# Patient Record
Sex: Female | Born: 1970 | Race: White | Hispanic: No | Marital: Married | Smoking: Never smoker
Health system: Southern US, Community
[De-identification: ages and names within clinical notes are randomized; demographics above are authoritative.]

## PROBLEM LIST (undated history)

## (undated) ENCOUNTER — Encounter

## (undated) ENCOUNTER — Encounter: Attending: Gastroenterology | Primary: Gastroenterology

## (undated) ENCOUNTER — Ambulatory Visit: Payer: PRIVATE HEALTH INSURANCE

## (undated) ENCOUNTER — Ambulatory Visit

## (undated) ENCOUNTER — Telehealth

## (undated) ENCOUNTER — Ambulatory Visit: Payer: MEDICARE

## (undated) ENCOUNTER — Inpatient Hospital Stay

## (undated) ENCOUNTER — Encounter: Attending: Physical Medicine & Rehabilitation | Primary: Physical Medicine & Rehabilitation

## (undated) ENCOUNTER — Ambulatory Visit: Payer: PRIVATE HEALTH INSURANCE | Attending: Registered" | Primary: Registered"

## (undated) ENCOUNTER — Ambulatory Visit
Payer: PRIVATE HEALTH INSURANCE | Attending: Student in an Organized Health Care Education/Training Program | Primary: Student in an Organized Health Care Education/Training Program

## (undated) ENCOUNTER — Ambulatory Visit: Payer: PRIVATE HEALTH INSURANCE | Attending: Ophthalmology | Primary: Ophthalmology

## (undated) ENCOUNTER — Encounter: Attending: "Endocrinology | Primary: "Endocrinology

## (undated) ENCOUNTER — Ambulatory Visit: Attending: Physical Medicine & Rehabilitation | Primary: Physical Medicine & Rehabilitation

## (undated) ENCOUNTER — Encounter: Attending: Pharmacist | Primary: Pharmacist

## (undated) ENCOUNTER — Encounter
Attending: Student in an Organized Health Care Education/Training Program | Primary: Student in an Organized Health Care Education/Training Program

## (undated) ENCOUNTER — Encounter: Attending: Psychiatric/Mental Health | Primary: Psychiatric/Mental Health

## (undated) ENCOUNTER — Telehealth: Attending: Physical Medicine & Rehabilitation | Primary: Physical Medicine & Rehabilitation

## (undated) ENCOUNTER — Ambulatory Visit: Payer: Medicaid (Managed Care)

## (undated) ENCOUNTER — Ambulatory Visit: Payer: PRIVATE HEALTH INSURANCE | Attending: "Endocrinology | Primary: "Endocrinology

## (undated) ENCOUNTER — Encounter: Attending: Registered" | Primary: Registered"

## (undated) ENCOUNTER — Telehealth
Attending: Student in an Organized Health Care Education/Training Program | Primary: Student in an Organized Health Care Education/Training Program

## (undated) ENCOUNTER — Other Ambulatory Visit

## (undated) ENCOUNTER — Ambulatory Visit
Payer: PRIVATE HEALTH INSURANCE | Attending: Physical Medicine & Rehabilitation | Primary: Physical Medicine & Rehabilitation

## (undated) ENCOUNTER — Encounter: Attending: Oncology | Primary: Oncology

## (undated) DIAGNOSIS — I10 Essential (primary) hypertension: Secondary | ICD-10-CM

## (undated) DIAGNOSIS — R51 Headache: Secondary | ICD-10-CM

## (undated) DIAGNOSIS — R519 Headache, unspecified: Secondary | ICD-10-CM

## (undated) DIAGNOSIS — H539 Unspecified visual disturbance: Secondary | ICD-10-CM

## (undated) HISTORY — PX: SINUS IRRIGATION: SHX2411

## (undated) HISTORY — DX: Headache: R51

## (undated) HISTORY — DX: Headache, unspecified: R51.9

## (undated) HISTORY — DX: Unspecified visual disturbance: H53.9

## (undated) HISTORY — DX: Essential (primary) hypertension: I10

## (undated) NOTE — Progress Notes (Signed)
 Formatting of this note is different from the original.   11/16/24 1342  Respiratory Charges  $ Unlisted Pulmonary Service/Proc 1 Procedure   Analyzed VBG for ER-5 Electronically signed by Alm PARAS. Rejeana, RRT at 11/16/2024 10:43 AM PST

## (undated) NOTE — ED Provider Notes (Signed)
 Formatting of this note is different from the original. [facility] EMERGENCY [address] [place] [address] [phone]  History Chief Complaint  Patient presents with   Abdominal Pain     Vomiting     Patient presents to ED with chief complaint of abdominal pain, nausea, vomiting, diarrhea.  Patient had one episode over Saturday of vomiting and diarrhea.  Today she had three episodes of vomiting.  Patient was started on monjero 05 weeks ago.  She has been taking 2.5 mg, yesterday was there was her 5th dose.  Patient was on Ozempic last year.  History of diabetes.  She is on insulin.  Denies any chest pain or shortness breath.  Patient has history of cholecystectomy.  History provided by:  Patient Language interpreter used: No   Abdominal Pain Pain location:  Epigastric Associated symptoms: diarrhea, nausea and vomiting   Associated symptoms: no chest pain, no chills, no fever, no hematuria, no shortness of breath and no sore throat   Vomiting Associated symptoms: abdominal pain and diarrhea   Associated symptoms: no chills, no fever, no myalgias and no sore throat    Past Medical History:  Diagnosis Date   Cancer (CMS/HCC)    Diabetes mellitus (CMS/HCC)    Hypertension    Lipidemia    HISTORY PROVIDED BY:  Patient  Past Surgical History:  Procedure Laterality Date   ABDOMINAL SURGERY     CHOLECYSTECTOMY     COLON SURGERY     ENDOMETRIAL ABLATION     HERNIA REPAIR     MANDIBLE SURGERY     ROTATOR CUFF REPAIR Bilateral    Social History   Tobacco Use   Smoking status: Never   Smokeless tobacco: Never  Vaping Use   Vaping status: never used  Substance Use Topics   Alcohol use: Never   Drug use: Never   Review of Systems  Constitutional:  Negative for chills and fever.  HENT:  Negative for sore throat and trouble swallowing.   Eyes:  Negative for pain and visual disturbance.  Respiratory:  Negative for chest tightness and shortness of breath.    Cardiovascular:  Negative for chest pain and leg swelling.  Gastrointestinal:  Positive for abdominal pain, diarrhea, nausea and vomiting.  Genitourinary:  Negative for flank pain and hematuria.  Musculoskeletal:  Negative for back pain and myalgias.  Neurological:  Negative for dizziness, weakness and light-headedness.  Psychiatric/Behavioral:  Negative for agitation and sleep disturbance. The patient is not nervous/anxious.    Physical Exam BP 122/73 (BP Location: Left arm, Patient Position: Lying)   Pulse 80   Temp 98.6 F (37 C) (Oral)   Resp 16   Ht 5' 6 (1.676 m)   Wt 113 kg (250 lb)   SpO2 97%   BMI 40.35 kg/m   Physical Exam Vitals and nursing note reviewed.  Constitutional:      Appearance: Normal appearance.  HENT:     Head: Normocephalic and atraumatic.  Cardiovascular:     Rate and Rhythm: Normal rate and regular rhythm.  Pulmonary:     Effort: Pulmonary effort is normal.     Breath sounds: Normal breath sounds.  Abdominal:     General: There is no distension.     Tenderness: There is abdominal tenderness in the right upper quadrant, epigastric area and left upper quadrant.  Musculoskeletal:        General: No deformity or signs of injury. Normal range of motion.     Cervical back:  No rigidity or tenderness.  Skin:    General: Skin is warm.     Capillary Refill: Capillary refill takes less than 2 seconds.  Neurological:     General: No focal deficit present.     Mental Status: She is alert.  Psychiatric:        Mood and Affect: Mood normal.        Behavior: Behavior normal.   Results Labs Reviewed  HEPATIC FUNCTION PANEL - Abnormal      Result Value   Albumin 3.5 (*)    Total Bilirubin 0.5     Bilirubin, Direct 0.11     Alkaline Phosphatase 105     AST 30     ALT 32     Total Protein 7.6     Calcium 8.7    BASIC METABOLIC PANEL - Abnormal   Sodium 141     Potassium 3.3 (*)    Chloride 102     CO2 28.0     BUN 12.0     Creatinine 0.66      Glucose 214 (*)    Calcium 8.6     Glomerular Filtration Rate >90.0     Anion Gap 11.0    MAGNESIUM, SERUM - Abnormal   Magnesium 1.32 (*)   CBC W/ AUTO DIFF - Abnormal   WBC 10.60     RBC 5.02     Hemoglobin 12.1     Hematocrit 38.9     MCV 77.5 (*)    MCH 24.0 (*)    MCHC 31.0 (*)    RDW 16.9 (*)    Platelets 402     MPV 6.9 (*)    Neutrophils (Relative) 75.9     Lymphocytes (Relative) 10.4     Monocytes (Relative) 9.4     Eosinophils (Relative) 4.1     Basophils (Relative) 0.2     Neutrophils (Absolute) 8.1 (*)    Lymphocytes (Absolute) 1.1     Monocytes (Absolute) 1.0 (*)    Eosinophils (Absolute) 0.4     Basophils (Absolute) 0.0    URINALYSIS, W/ REFLEX TO MICRO AND CULT, IF INDICATED - Abnormal   Glucose, UA 2+ (*)    Protein, UA Negative     Bilirubin, UA Negative     Urobilinogen, UA <2.0     pH, UA 5.5     Blood, UA Negative     Ketones, UA Negative     Nitrite, UA Negative     Leukocyte esterase, UA 1+ (*)    Clarity, UA Sl cloudy (*)    Specific Gravity, UA >=1.030     Color, UA Yellow    URINALYSIS, MICROSCOPIC ONLY - Abnormal   RBC, UA 0-2 Rare     WBC, UA 0-5 Rare     Bacteria, UA 1-9 Few (*)    Squamous Epithelial Cells, UA >10 Many (*)   VENOUS BLOOD GAS - Abnormal   pH 7.380     pCO2 44.1     pO2 32.8     HCO3 25.5     Be 0.2     sO2 59.7 (*)    Sample Type VBG     Reported to and Read Back By X2 Dr. Norval     Reported by D.Ostrowski     Read back x 2 Y     Mode ROOM AIR     FiO2 21     Sample Site NA     Allen's Test No  Spont. RR 18     Time Reported 13:44, 11/16/2024     Room [facility] A05    POCT GLUCOSE - Abnormal   POC Glucose 207 (*)   TROPONIN I - Normal   HS Troponin I 8     Narrative:    High-sensitivity Troponin I (hsTnI) assay can reliably detect low troponin concentrations relative to conventional troponin assays. It is the preferred marker of myocardial necrosis as recommended by the Fourth Universal Definition of  Myocardial Infarction Guidelines.  The diagnosis of acute myocardial infarction (AMI) is made based on a rise or fall of troponin with at least one measurement exceeding the laboratory's upper limit of normal(indicating myocardial injury), in the context of reasonable suspicion for coronary ischemia (e.g. typical symptoms, changes on ECG, evidence for loss of myocardial function or demonstration of obstructive coronary artery disease).  NOTE: Although abnormal hsTnI values reflect INJURY to myocardial cells, an elevated hsTnI does not indicate the CAUSE of injury (i.e. ischemia versus non-ischemic disease).  In some cases, myocardial injury is chronic and relatively stable such that hsTnI values remain elevated but do not change substantially over hours to days (examples include chronic kidney disease, heart failure or advanced patient age).  When determining whether there has been a significant rise or fall of troponin on serial sampling, absolute change in troponin concentration has greater diagnostic accuracy for AMI than relative change criteria. A change of >7 ng/L over a 2-hour interval or a change of >10 ng/L over a 3-hour interval is suggested as a significant change.  If the initial hsTnI is below or slightly above the 99th percentile upper reference limit, a 50% change from the baseline is considered significant.  If the initial hsTnI is above the 99th percentile upper reference limit, a 20% change from the baseline value is considered significant.  APTT - Normal   PTT 23.4    LIPASE, SERUM - Normal   Lipase 48    PROTIME-INR - Normal   Protime 10.3     INR 0.94     Narrative:    Clinical Situation                       INR Target   Mechanical prosthetic heart valves       2.5-3.5 Prevention of systemic embolism from     2.0-3.0      Acute myocardila infarction      Valvular heart disease      Atrial fibrilation Pulmonary embolism treatment             2.0-3.0 Venous thrombosis  treatment              2.0-3.0  ACETONE - Normal   Acetone, Bld Negative    CORD BLOOD GAS, VENOUS   Cord pH       Sample Type Blood Venous     Mode ROOM AIR     Spont. RR 18     Sample Site NA     FiO2 21     Room [facility] A05    PH, VENOUS   Imaging Imaging Results       CT Abdomen Pelvis with IV Contrast Only (Final result)  Result time 11/16/24 13:37:11    Final result by Marlyse LOISE Blanch, MD (11/16/24 13:37:11)        Impression:   Fluid-filled colon which may be seen in colitis.  Please correlate clinically.  Normal appendix.  No urolithiasis.  Interpreted  and Electronically Signed by Marlyse Blanch, MD at 11/16/2024 10:37 AM on Workstation [id number]       Narrative:   Procedure:Helical CT Abdomen and Pelvis examination performed with axial sections with  administration of non-ionic IV contrast. Coronal and sagittal 3 mm thick reformats also acquired. CT scan performed according to ALARA(as low as reasonably achievable)dose protocol.  Comparison:None.  History: other,  Findings:  Liver: Mildly enlarged size of the liver. No focal lesion.  Fatty liver. Gallbladder: Cholecystectomy.  Spleen: Normal in size. Pancreas: Normal. No surrounding edema. Adrenal glands: No nodules. Kidneys:  No hydronephrosis. Peritoneum: There is no free air or abscess. Bowel: No obstruction. No evidence of inflamed bowel loops. Appendix is normal.  Fluid-filled colon.  No bowel wall thickening. Mesentery: No adenopathy. Retroperitoneum: Aorta and inferior vena cava unremarkable. Pelvis: Bladder is unremarkable. Bones: No acute fracture. Lung bases: Visualized lung bases are negative. Heart is normal in size.             ED Course   Procedures  MDM  Patient presents with chief complaint of abdominal pain, nausea, vomiting, diarrhea.  Patient had episodes of vomiting and diarrhea onset today.  Symptoms started again today.  Patient is on her 5th dose of Mounjaro.   Last dose was yesterday.  Denies any chest pain shortness breath.  History of diabetes.  On insulin.  History of cholecystectomy.  Physical exam shows patient has epigastric periumbilical tenderness.  No peritoneal signs.  Basic labs ordered, abdominal labs, PH, beta hydroxybutyrate Imaging studies ordered CT abdomen pelvis Patient is was given IV fluids, Pepcid, Toradol , Zofran   Labs reviewed, interpreted by me it shows WBC 10.6.  Hemoglobin 12.1.  Magnesium 1.3.  Magnesium was replaced due to hypomagnesemia.  PT PTT normal.  Troponin is eight.  CT scan reviewed, interpreted by me shows no acute process.  Radiology report noted presence of colitis.  Patient was re-evaluated feeling much better.  Suspect the colitis secondary to oral intake along with side effects of medications.  No antibiotics indicated.  Patient is stable for discharge with outpatient follow-up  Patient is stable for discharge from emergency room standpoint.  Strict return instructions given explained.  Follow up with the PCP in outpatient setting.  Return instructions given explained.  Diagnosis Acute colitis  Diarrhea  ED Disposition Discharge  Final diagnoses:  Acute colitis    Follow-up Information     [facility].   Specialty: Emergency Medicine Contact information [address] [place]  [address] [phone]           Norval DELENA Maroon, DO 11/16/24 2321  Electronically signed by Norval DELENA Maroon, DO at 11/16/2024  8:21 PM PST

## (undated) NOTE — ED Notes (Signed)
 Formatting of this note might be different from the original. To CT via stretcher. Electronically signed by Levon Bel, RN at 11/16/2024 10:00 AM PST

---
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
## 2007-06-29 ENCOUNTER — Encounter: Payer: Self-pay | Admitting: Neurology

## 2009-06-02 ENCOUNTER — Encounter: Admission: RE | Admit: 2009-06-02 | Discharge: 2009-06-02 | Payer: Self-pay | Admitting: Otolaryngology

## 2011-06-22 ENCOUNTER — Encounter: Payer: Self-pay | Admitting: Neurology

## 2011-07-09 ENCOUNTER — Encounter: Payer: Self-pay | Admitting: Neurology

## 2011-09-02 DIAGNOSIS — H9201 Otalgia, right ear: Secondary | ICD-10-CM | POA: Insufficient documentation

## 2011-09-02 DIAGNOSIS — H9209 Otalgia, unspecified ear: Secondary | ICD-10-CM | POA: Insufficient documentation

## 2012-02-03 ENCOUNTER — Encounter: Payer: Self-pay | Admitting: Neurology

## 2014-12-07 ENCOUNTER — Ambulatory Visit (INDEPENDENT_AMBULATORY_CARE_PROVIDER_SITE_OTHER): Payer: BC Managed Care – PPO | Admitting: Neurology

## 2014-12-07 ENCOUNTER — Encounter: Payer: Self-pay | Admitting: Neurology

## 2014-12-07 VITALS — BP 134/82 | HR 70 | Resp 14 | Ht 66.0 in | Wt 271.2 lb

## 2014-12-07 DIAGNOSIS — G4733 Obstructive sleep apnea (adult) (pediatric): Secondary | ICD-10-CM | POA: Insufficient documentation

## 2014-12-07 DIAGNOSIS — G43709 Chronic migraine without aura, not intractable, without status migrainosus: Secondary | ICD-10-CM | POA: Insufficient documentation

## 2014-12-07 DIAGNOSIS — R930 Abnormal findings on diagnostic imaging of skull and head, not elsewhere classified: Secondary | ICD-10-CM

## 2014-12-07 DIAGNOSIS — R9089 Other abnormal findings on diagnostic imaging of central nervous system: Secondary | ICD-10-CM | POA: Insufficient documentation

## 2014-12-07 DIAGNOSIS — IMO0002 Reserved for concepts with insufficient information to code with codable children: Secondary | ICD-10-CM | POA: Insufficient documentation

## 2014-12-07 MED ORDER — OXYCODONE-ACETAMINOPHEN 10-325 MG PO TABS: 1.0000 | ORAL_TABLET | Freq: Three times a day (TID) | ORAL | Status: DC | PRN

## 2014-12-07 NOTE — Progress Notes (Signed)
[facility]  PATIENT: Pamela Murphy DOB: [birth year]  REFERRING CLINICIAN: [facility] HISTORY FROM: Patient REASON FOR VISIT: Chronic Migraine headaches   HISTORICAL  CHIEF COMPLAINT:  Chief Complaint  Patient presents with  . Migraine    Here to establish care.  F/U migraines.  Had Botox inj. given today at [facility].      HISTORY OF PRESENT ILLNESS:  Pamela Murphy is a 44 year old woman with a chronic migraine disorder, obstructive sleep apnea and abnormal MRI of the brain.  She has a long history of chronic migraines that were occurring almost every day (25-28 days/month) for 4 or more hours a day. Multiple medications were tried for prophylactic and abortive therapies with suboptimal response. Migraines are worse on the right than the left.   When present, she has throbbing pain that is associated with photophobia and phonophobia.   For the past 5 years she has been on Botox, 200 units in the muscles of the scalp and neck.  On Botox the headaches occur much less frequently. She no longer gets daily headaches. She has a more severe migraine she takes Toradol injections or Maxalt pills. She has tolerated Botox very well and has not had any complications. She also has some neck pain and MRI on 09/12/2014 showed mild degenerative changes at C5-C6 and C6-C7 with no definite nerve root compression.  She has obstructive sleep apnea and uses CPAP nightly. She feels better when she uses it less hypersomnia.   She does not have any difficulty with the mask or with leakage. She brings it with her when she travels.   She has gained a few more pounds but has thyroid dysfunction.  She has an abnormal MRI of the brain showing small round foci predominantly in the subcortical and deep white matter and nonspecific manner. In the past, she was diagnosed with MS and was actually on therapy for several years. Her last MRI of the brain was in 2010 and was stable compared  to the previous ones.   REVIEW OF SYSTEMS:  Constitutional: No fevers, chills, sweats, or change in appetite Eyes: No visual changes, double vision, eye pain Ear, nose and throat: No hearing loss, ear pain, nasal congestion, sore throat Cardiovascular: No chest pain, palpitations Respiratory:  No shortness of breath at rest or with exertion.   No wheezes GastrointestinaI: No nausea, vomiting, diarrhea, abdominal pain, fecal incontinence Genitourinary:  No dysuria, urinary retention or frequency.  No nocturia. Musculoskeletal:  No neck pain, back pain Integumentary: No rash, pruritus, skin lesions Neurological: as above Psychiatric: No depression at this time.  No anxiety Endocrine: No palpitations, diaphoresis, change in appetite, change in weigh or increased thirst Hematologic/Lymphatic:  No anemia, purpura, petechiae. Allergic/Immunologic: No itchy/runny eyes, nasal congestion, recent allergic reactions, rashes  ALLERGIES: Allergies  Allergen Reactions  . Cefaclor Rash    Other Reaction: neck swelling  . Prochlorperazine Maleate Rash    Other Reaction: jittery;tachycardia  . Sulfamethoxazole-Trimethoprim Nausea Only  . Tessalon Perles [Benzonatate] Rash    HOME MEDICATIONS: No outpatient prescriptions prior to visit.   No facility-administered medications prior to visit.    PAST MEDICAL HISTORY: Past Medical History  Diagnosis Date  . Headache   . Hypertension   . Vision abnormalities     PAST SURGICAL HISTORY: Past Surgical History  Procedure Laterality Date  . Sinus irrigation      FAMILY HISTORY: Family History  Problem Relation Age of Onset  . Breast cancer Mother   .  Melanoma Mother   . Atrial fibrillation Mother   . Supraventricular tachycardia Mother   . Heart attack Father   . Hypertension Father   . Hyperlipidemia Father     SOCIAL HISTORY:  History   Social History  . Marital Status: Single    Spouse Name: N/A    Number of Children: N/A   . Years of Education: N/A   Occupational History  . Not on file.   Social History Main Topics  . Smoking status: Never Smoker   . Smokeless tobacco: Not on file  . Alcohol Use: No     Comment: Rare  . Drug Use: No  . Sexual Activity: Not on file   Other Topics Concern  . Not on file   Social History Narrative  . No narrative on file     PHYSICAL EXAM  Filed Vitals:   12/07/14 1125  BP: 134/82  Pulse: 70  Resp: 14  Height: 5\' 6"  (1.676 m)  Weight: 271 lb 3.2 oz (123.016 kg)    Body mass index is 43.79 kg/(m^2).   General: The patient is well-developed and well-nourished and in no acute distress  Eyes:  Funduscopic exam shows normal optic discs and retinal vessels.  Neck: The neck is supple, no carotid bruits are noted.  The neck is nontender.  Respiratory: The respiratory examination is clear.  Cardiovascular: The cardiovascular examination reveals a regular rate and rhythm, no murmurs, gallops or rubs are noted.  Skin: Extremities are without significant edema.  Neurologic Exam  Mental status: The patient is alert and oriented x 3 at the time of the examination. The patient has apparent normal recent and remote memory, with an apparently normal attention span and concentration ability.   Speech is normal.  Cranial nerves: Extraocular movements are full. Pupils are equal, round, and reactive to light and accomodation.  Visual fields are full.  Facial symmetry is present. There is good facial sensation to soft touch bilaterally.Facial strength is normal.  Trapezius and sternocleidomastoid strength is normal. No dysarthria is noted.  The tongue is midline, and the patient has symmetric elevation of the soft palate. No obvious hearing deficits are noted.  Motor:  Muscle bulk and tone are normal. Strength is  5 / 5 in all 4 extremities.   Sensory: Sensory testing is intact to pinprick, soft touch, vibration sensation, and position sense on all 4  extremities.  Coordination: Cerebellar testing reveals good finger-nose-finger and heel-to-shin bilaterally.  Gait and station: Station and gait are normal. Tandem gait is normal. Romberg is negative.   Reflexes: Deep tendon reflexes are symmetric and normal bilaterally. Plantar responses are normal.    DIAGNOSTIC DATA (LABS, IMAGING, TESTING) - I reviewed patient records, labs, notes, testing and imaging myself where available.     ASSESSMENT AND PLAN  Chronic migraine  Abnormal brain MRI  Obstructive sleep apnea  In summary, Ms. Louischarles is a 44 year old woman with chronic migraines that have been reasonably well controlled on Botox injections every 3 months. She is transferring care to this office. Her last injection was today at her previous neurology office, [facility] [place] [facility].  She will need to return in mid April for her next injections. We will try to get her pre-Authorized.   Headache she will continue to take either Maxalt or give herself Toradol injections. 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  She has an abnormal brain MRI exactly diagnosed with MS in the past and was even on therapy for several years. We discussed getting another MRI soon to compare with her prior one to ensure stability. If there are changes, we will have to reconsider the diagnosis. She is doing well on CPAP for her OSA and will continue.  She will return to see me in 3 months for her next Botox injection, or sooner if she has new or worsening neurologic symptoms.    Excell Neyland A. Felecia Shelling, MD, PhD 5/49/8264, 15:83 AM Certified in Neurology, Clinical Neurophysiology, Sleep Medicine, Pain Medicine and Neuroimaging  [facility] [address] [place], [place] [address] [phone]

## 2014-12-07 NOTE — Patient Instructions (Signed)

## 2015-01-09 ENCOUNTER — Other Ambulatory Visit: Payer: Self-pay | Admitting: Neurology

## 2015-03-11 ENCOUNTER — Encounter: Payer: Self-pay | Admitting: Neurology

## 2015-03-11 ENCOUNTER — Ambulatory Visit (INDEPENDENT_AMBULATORY_CARE_PROVIDER_SITE_OTHER): Payer: BC Managed Care – PPO | Admitting: Neurology

## 2015-03-11 VITALS — BP 148/84 | HR 68 | Resp 14 | Ht 66.0 in | Wt 282.0 lb

## 2015-03-11 DIAGNOSIS — R930 Abnormal findings on diagnostic imaging of skull and head, not elsewhere classified: Secondary | ICD-10-CM | POA: Diagnosis not present

## 2015-03-11 DIAGNOSIS — G4733 Obstructive sleep apnea (adult) (pediatric): Secondary | ICD-10-CM

## 2015-03-11 DIAGNOSIS — R9089 Other abnormal findings on diagnostic imaging of central nervous system: Secondary | ICD-10-CM

## 2015-03-11 DIAGNOSIS — G43709 Chronic migraine without aura, not intractable, without status migrainosus: Secondary | ICD-10-CM

## 2015-03-11 DIAGNOSIS — IMO0002 Reserved for concepts with insufficient information to code with codable children: Secondary | ICD-10-CM

## 2015-03-11 DIAGNOSIS — M542 Cervicalgia: Secondary | ICD-10-CM | POA: Diagnosis not present

## 2015-03-11 DIAGNOSIS — R5383 Other fatigue: Secondary | ICD-10-CM | POA: Diagnosis not present

## 2015-03-11 NOTE — Progress Notes (Signed)
[facility]  PATIENT: Pamela Murphy DOB: 05-May-1971 REASON FOR VISIT: Chronic Migraine headaches   HISTORICAL  CHIEF COMPLAINT:  Chief Complaint  Patient presents with  . Migraines    Here for Botox to treat migraines/fim    HISTORY OF PRESENT ILLNESS:  Pamela Murphy is a 44 year old woman with a chronic migraine disorder, obstructive sleep apnea and abnormal MRI of the brain.  Chronic Migraine/neck pain:   She has a long history of chronic migraines that were occurring almost every day (25-28 days/month) for 4 or more hours a day. Multiple medications were tried for prophylactic and abortive therapies with suboptimal response. Migraines are worse on the right than the left.   When present, she has throbbing pain that is associated with photophobia and phonophobia.   Since 2010 or 2011,  she has been on Botox, 200 units in the muscles of the scalp and neck.  On Botox the headaches occur much less frequently. She has had less than one severe migraine a month over the past year while staying on Botox. She has had some aura without headaches but with nausea.   She no longer gets daily headaches. She has a more severe migraine she takes Toradol injections or Maxalt pills. She has tolerated Botox very well and has not had any complications. She also has some neck pain and MRI on 09/12/2014 showed mild degenerative changes at C5-C6 and C6-C7 with no definite nerve root compression.  Her last injection was 12/06/2014.   In the past, she has failed multiple medications including:  Antiepileptics (Topamax, Keppra, zonisamide), anti-depressants (Cymbalta, Prozac), multiple NSAID's, betas blockers (Inderal, atenolol), muscle relaxants (soma, tizanidine, cyclobenzaprine) in various combinations.   Once the headache occurs, she has tried triptan's with incomplete benefit much of the time. She has also tried opiates and IM Toradol with variable benefit.  OSA:  She has obstructive sleep  apnea and uses CPAP nightly. She feels better when she uses it nightly and has with less hypersomnia.   She does not have any difficulty with the mask or with leakage. She brings it with her when she travels.   She has gained a few more pounds but has thyroid dysfunction.   More recently, she is feeling tired but has had iron deficiency anemia and will be getting a uterine ablation procedure well.     Abnormal MRI/?MS:  She has an abnormal MRI of the brain showing small round foci predominantly in the subcortical and deep white matter and nonspecific manner. In the past, she was diagnosed with MS and was on therapy for several years. Her last MRI of the brain was in 2010 and was stable compared to the previous ones.   She notes fatigue is worse and is concerned about the possibility of MS.    REVIEW OF SYSTEMS:  Constitutional: No fevers, chills, sweats, or change in appetite Eyes: No visual changes, double vision, eye pain Ear, nose and throat: No hearing loss, ear pain, nasal congestion, sore throat Cardiovascular: No chest pain, palpitations Respiratory:  No shortness of breath at rest or with exertion.   No wheezes GastrointestinaI: No nausea, vomiting, diarrhea, abdominal pain, fecal incontinence Genitourinary:  No dysuria, urinary retention or frequency.  No nocturia.   Dysfunctional uterine bleeding Musculoskeletal:  No neck pain, back pain Integumentary: No rash, pruritus, skin lesions Neurological: as above Psychiatric: No depression at this time.  No anxiety Endocrine: No palpitations, diaphoresis, change in appetite, change in weigh or increased thirst Hematologic/Lymphatic:  She has anemia due to dysfunctional uterine bleeding. Allergic/Immunologic: No itchy/runny eyes, nasal congestion, recent allergic reactions, rashes  ALLERGIES: Allergies  Allergen Reactions  . Cefaclor Rash    Other Reaction: neck swelling  . Prochlorperazine Maleate Rash    Other Reaction:  jittery;tachycardia  . Sulfamethoxazole-Trimethoprim Nausea Only  . Tessalon Perles [Benzonatate] Rash    HOME MEDICATIONS: Outpatient Prescriptions Prior to Visit  Medication Sig Dispense Refill  . atenolol (TENORMIN) 25 MG tablet TAKE 1 TABLET EVERY DAY 90 tablet 4  . B-D 3CC LUER-LOK SYR 23GX1" 23G X 1" 3 ML MISC See admin instructions.  11  . ketorolac (TORADOL) 60 MG/2ML SOLN injection   11  . Ketorolac Tromethamine (TORADOL IM IM) Take 30 mg by mouth as needed.    Marland Kitchen levothyroxine (SYNTHROID, LEVOTHROID) 50 MCG tablet Take 50 mcg by mouth daily.  2  . NEEDLE, DISP, 25 G 25G X 1-1/2" MISC     . ondansetron (ZOFRAN-ODT) 8 MG disintegrating tablet     . oxyCODONE-acetaminophen (PERCOCET) 10-325 MG per tablet Take 1 tablet by mouth every 8 (eight) hours as needed for pain. 1 pill every 4-6 hours as needed for breakthrough pain as needed 60 tablet 0  . pantoprazole (PROTONIX) 40 MG tablet Take 40 mg by mouth.    . rizatriptan (MAXALT-MLT) 10 MG disintegrating tablet Take 10 mg by mouth as needed.    Marland Kitchen tiZANidine (ZANAFLEX) 4 MG tablet 0.5 tab (2 mg) by mouth every 8 hours    . tiZANidine (ZANAFLEX) 4 MG tablet Take 4 mg by mouth 3 (three) times daily.  2  . Vitamin D, Ergocalciferol, (DRISDOL) 50000 UNITS CAPS capsule Take 50,000 Units by mouth once a week.    Marland Kitchen YALE DISP NEEDLES 25GX1-1/2" 25G X 1-1/2" MISC See admin instructions.  11   No facility-administered medications prior to visit.    PAST MEDICAL HISTORY: Past Medical History  Diagnosis Date  . Headache   . Hypertension   . Vision abnormalities     PAST SURGICAL HISTORY: Past Surgical History  Procedure Laterality Date  . Sinus irrigation      FAMILY HISTORY: Family History  Problem Relation Age of Onset  . Breast cancer Mother   . Melanoma Mother   . Atrial fibrillation Mother   . Supraventricular tachycardia Mother   . Heart attack Father   . Hypertension Father   . Hyperlipidemia Father     SOCIAL  HISTORY:  History   Social History  . Marital Status: Single    Spouse Name: N/A  . Number of Children: N/A  . Years of Education: N/A   Occupational History  . Not on file.   Social History Main Topics  . Smoking status: Never Smoker   . Smokeless tobacco: Not on file  . Alcohol Use: No     Comment: Rare  . Drug Use: No  . Sexual Activity: Not on file   Other Topics Concern  . Not on file   Social History Narrative     PHYSICAL EXAM  Filed Vitals:   03/11/15 0942  BP: 148/84  Pulse: 68  Resp: 14  Height: 5\' 6"  (1.676 m)  Weight: 282 lb (127.914 kg)    Body mass index is 45.54 kg/(m^2).   General: The patient is well-developed and well-nourished and in no acute distress  Neck: The neck is mildly tender at occiput  Neurologic Exam  Mental status: The patient is alert and oriented x 3 at the time  of the examination.  Speech is normal.  Cranial nerves: Extraocular movements are full.Facial strength is normal.  Trapezius and sternocleidomastoid strength is normal. No dysarthria is noted.  The tongue is midline, and the patient has symmetric elevation of the soft palate. No obvious hearing deficits are noted.  Gait and station: Station and gait are normal.  Romberg is negative.     DIAGNOSTIC DATA (LABS, IMAGING, TESTING) - I reviewed patient records, labs, notes, testing and imaging myself where available.     ASSESSMENT AND PLAN  Chronic migraine  Obstructive sleep apnea  Abnormal brain MRI    1.    Botox 200 U as follows:   Frontalis (5 U x 4), nasalis/corrugators (5 U x 3), temporalis  (5 U x 8), occipitalis (5 U x 6), splenius capitis (15 U x 2), trapezius (10 U x 2), C6C7 paraspinal muscles (10 U x 2), wasted (25 U) 2.    Continue Maxalt and/or IM Toradol if moderate or severe headache occurs. 3.    Continue CPAP at current pressures for oversight. 4.    We discussed her prior brain MRI. To rule out subclinical progression that might  indicate that she has multiple sclerosis, we will need to check another MRI later this year.  If there are changes, we will have to reconsider the diagnosis of MS.   She will return to see me in 3 months for her next Botox injection, or sooner if she has new or worsening neurologic symptoms.    Nyeemah Jennette A. Felecia Shelling, MD, PhD 8/67/5449, 2:01 AM Certified in Neurology, Clinical Neurophysiology, Sleep Medicine, Pain Medicine and Neuroimaging  [facility] [address] [place], [place] [address] [phone]

## 2015-06-06 ENCOUNTER — Telehealth: Payer: Self-pay

## 2015-06-06 NOTE — Telephone Encounter (Signed)
Spoke to patient about office policy of ordering Botox through [employer]. Patient said she was unaware and she need to have her Botox this Wednesday. Advised would have med (buy and bill). Advised would speak with Dr. Garth Bigness nurse in reference to botox injections going forward. Patient agreed.

## 2015-06-07 ENCOUNTER — Telehealth: Payer: Self-pay | Admitting: Neurology

## 2015-06-07 MED ORDER — RIZATRIPTAN BENZOATE 10 MG PO TBDP: 10.0000 mg | ORAL_TABLET | ORAL | Status: DC | PRN

## 2015-06-07 NOTE — Telephone Encounter (Signed)
Outreached to patient and advised the following update, that she will be able to discuss her future Botox injections and medication ordering procedures with Dr. Felecia Shelling on her next appointment on 06/12/2015.

## 2015-06-07 NOTE — Telephone Encounter (Signed)
Rx has been sent  

## 2015-06-07 NOTE — Telephone Encounter (Signed)
Received phone call from patient expressing concern due to the current practice policy change of ordering Botox through the [facility] and no longer buying an billing for Botox medication. Patient stated she will now have increased cost for Botox up to $2000/ per year. Advised patient that there is Botox Assistance available. Also advised patient that per previous account note, Dr. Garth Bigness nurse was being contacted to address Botox injection going forward. Advised patient will call her back once and update is available @ [phone] on her mobile number.

## 2015-06-07 NOTE — Telephone Encounter (Signed)
Patient is calling to get a new Rx for rizatriptan (MAXALT-MLT) 10 MG disintegrating tablet called to [facility] on [address]. The patient states she is out of this medication. Thank you.

## 2015-06-10 ENCOUNTER — Telehealth: Payer: Self-pay | Admitting: Neurology

## 2015-06-10 NOTE — Telephone Encounter (Signed)
Received call from patient requesting to reschedule her Botox appt on Jul 10, 2015 due to patient had death in the family and needs to reschedule. Patient appointment was changed  to 06/14/15 @ 11 am-due to patient had death in the family.

## 2015-06-11 ENCOUNTER — Ambulatory Visit: Payer: BC Managed Care – PPO | Admitting: Neurology

## 2015-06-12 ENCOUNTER — Ambulatory Visit: Payer: Self-pay | Admitting: Neurology

## 2015-06-14 ENCOUNTER — Ambulatory Visit (INDEPENDENT_AMBULATORY_CARE_PROVIDER_SITE_OTHER): Payer: BC Managed Care – PPO | Admitting: Neurology

## 2015-06-14 ENCOUNTER — Encounter: Payer: Self-pay | Admitting: Neurology

## 2015-06-14 ENCOUNTER — Other Ambulatory Visit: Payer: Self-pay | Admitting: *Deleted

## 2015-06-14 VITALS — BP 144/86 | HR 74 | Resp 16 | Ht 66.0 in | Wt 281.0 lb

## 2015-06-14 DIAGNOSIS — G43709 Chronic migraine without aura, not intractable, without status migrainosus: Secondary | ICD-10-CM | POA: Diagnosis not present

## 2015-06-14 DIAGNOSIS — G4733 Obstructive sleep apnea (adult) (pediatric): Secondary | ICD-10-CM

## 2015-06-14 DIAGNOSIS — IMO0002 Reserved for concepts with insufficient information to code with codable children: Secondary | ICD-10-CM

## 2015-06-14 DIAGNOSIS — M542 Cervicalgia: Secondary | ICD-10-CM | POA: Diagnosis not present

## 2015-06-14 DIAGNOSIS — R9089 Other abnormal findings on diagnostic imaging of central nervous system: Secondary | ICD-10-CM

## 2015-06-14 DIAGNOSIS — R930 Abnormal findings on diagnostic imaging of skull and head, not elsewhere classified: Secondary | ICD-10-CM

## 2015-06-14 MED ORDER — "BD LUER-LOK SYRINGE 23G X 1"" 3 ML MISC": Status: DC

## 2015-06-14 MED ORDER — ONABOTULINUMTOXINA 200 UNITS IJ SOLR: INTRAMUSCULAR | Status: DC

## 2015-06-14 MED ORDER — "YALE DISP NEEDLES 25G X 1-1/2"" MISC": Status: DC

## 2015-06-14 MED ORDER — KETOROLAC TROMETHAMINE 60 MG/2ML IM SOLN: 60.0000 mg | Freq: Once | INTRAMUSCULAR | Status: DC

## 2015-06-14 NOTE — Telephone Encounter (Signed)
rx. for Botox given to pt. to fax to insurance/fim

## 2015-06-14 NOTE — Progress Notes (Signed)
[facility]  PATIENT: Pamela Murphy DOB: 1971-01-19 REASON FOR VISIT: Chronic Migraine headaches   HISTORICAL  CHIEF COMPLAINT:  Chief Complaint  Patient presents with  . Migraines    Here for Botox inj. to treat migraine h/a's/fim    HISTORY OF PRESENT ILLNESS:  Pamela Murphy is a 44 year old woman with a chronic migraine disorder, obstructive sleep apnea and abnormal MRI of the brain.     Chronic Migraine/neck pain:   She is still getting occasional headaches.   Migraines are worse on the right than the left.   When present, she has throbbing pain that is associated with photophobia and phonophobia.   Since 2010 or 2011,  she has been on Botox, 200 units in the muscles of the scalp and neck.  On Botox the headaches occur much less frequently. She has had less than one severe migraine a month over the past year while staying on Botox. She has had some aura without headaches but with nausea.   She no longer gets daily headaches. She has a more severe migraine she takes Toradol injections or Maxalt pills. She has tolerated Botox very well and has not had any complications.   Migraine history:  She has a long history of chronic migraines that were occurring almost every day (25-28 days/month) for 4 or more hours a day. Multiple medications were tried for prophylactic and abortive therapies with suboptimal response.    In the past, she has failed multiple medications including:  Antiepileptics (Topamax, Keppra, zonisamide), anti-depressants (Cymbalta, Prozac), multiple NSAID's, betas blockers (Inderal, atenolol), muscle relaxants (soma, tizanidine, cyclobenzaprine) in various combinations.   Once the headache occurs, she has tried triptan's with incomplete benefit much of the time. She has also tried opiates.   IM Toradol has been most effective when one occurs.   Neck pain:   Her neck pain is doing about the same in general but is worse today.   It is axial without radiation  to the arms.  MRI on 09/12/2014 showed mild degenerative changes at C5-C6 and C6-C7 with no definite nerve root compression.       OSA:  She has severe obstructive sleep apnea and uses CPAP nightly. She feels better when she uses it nightly and has with less hypersomnia.   She does not have any difficulty with the mask or with leakage. She brings it with her when she travels.   She has fatigue but denies much hypersomnia.    Abnormal MRI/?MS:  She was diagnosed with MS in the past and was on therapy for several years.  MRI  Shows small round foci predominantly in the subcortical and deep white matter in a nonspecific manner.  Her last MRI of the brain was in 2013 and was stable compared to the previous ones.  She had had paresthesias and fatigue in the past that contributed to her diagnosis   REVIEW OF SYSTEMS:  Constitutional: No fevers, chills, sweats, or change in appetite Eyes: No visual changes, double vision, eye pain Ear, nose and throat: No hearing loss, ear pain, nasal congestion, sore throat Cardiovascular: No chest pain, palpitations Respiratory:  No shortness of breath at rest or with exertion.   No wheezes GastrointestinaI: No nausea, vomiting, diarrhea, abdominal pain, fecal incontinence Genitourinary:  No dysuria, urinary retention or frequency.  No nocturia.   Dysfunctional uterine bleeding Musculoskeletal:  No neck pain, back pain Integumentary: No rash, pruritus, skin lesions Neurological: as above Psychiatric: No depression at this time.  No anxiety Endocrine: No palpitations, diaphoresis, change in appetite, change in weigh or increased thirst Hematologic/Lymphatic:   She has anemia due to dysfunctional uterine bleeding. Allergic/Immunologic: No itchy/runny eyes, nasal congestion, recent allergic reactions, rashes  ALLERGIES: Allergies  Allergen Reactions  . Cefaclor Rash    Other Reaction: neck swelling  . Prochlorperazine Maleate Rash    Other Reaction:  jittery;tachycardia  . Sulfamethoxazole-Trimethoprim Nausea Only  . Tessalon Perles [Benzonatate] Rash    HOME MEDICATIONS: Outpatient Prescriptions Prior to Visit  Medication Sig Dispense Refill  . atenolol (TENORMIN) 25 MG tablet TAKE 1 TABLET EVERY DAY 90 tablet 4  . B-D 3CC LUER-LOK SYR 23GX1" 23G X 1" 3 ML MISC See admin instructions.  11  . ketorolac (TORADOL) 60 MG/2ML SOLN injection   11  . NEEDLE, DISP, 25 G 25G X 1-1/2" MISC     . ondansetron (ZOFRAN-ODT) 8 MG disintegrating tablet     . oxyCODONE-acetaminophen (PERCOCET) 10-325 MG per tablet Take 1 tablet by mouth every 8 (eight) hours as needed for pain. 1 pill every 4-6 hours as needed for breakthrough pain as needed 60 tablet 0  . pantoprazole (PROTONIX) 40 MG tablet Take 40 mg by mouth.    . rizatriptan (MAXALT-MLT) 10 MG disintegrating tablet Take 1 tablet (10 mg total) by mouth as needed for migraine (May repeat in 2 hours if needed. Max 3 tabs in 24 hours.). 12 tablet 1  . tiZANidine (ZANAFLEX) 4 MG tablet 0.5 tab (2 mg) by mouth every 8 hours    . Vitamin D, Ergocalciferol, (DRISDOL) 50000 UNITS CAPS capsule Take 50,000 Units by mouth once a week.    Marland Kitchen YALE DISP NEEDLES 25GX1-1/2" 25G X 1-1/2" MISC See admin instructions.  11  . Ketorolac Tromethamine (TORADOL IM IM) Take 30 mg by mouth as needed.    Marland Kitchen levothyroxine (SYNTHROID, LEVOTHROID) 50 MCG tablet Take 50 mcg by mouth daily.  2  . tiZANidine (ZANAFLEX) 4 MG tablet Take 4 mg by mouth 3 (three) times daily.  2   No facility-administered medications prior to visit.    PAST MEDICAL HISTORY: Past Medical History  Diagnosis Date  . Headache   . Hypertension   . Vision abnormalities     PAST SURGICAL HISTORY: Past Surgical History  Procedure Laterality Date  . Sinus irrigation      FAMILY HISTORY: Family History  Problem Relation Age of Onset  . Breast cancer Mother   . Melanoma Mother   . Atrial fibrillation Mother   . Supraventricular tachycardia  Mother   . Heart attack Father   . Hypertension Father   . Hyperlipidemia Father     SOCIAL HISTORY:  History   Social History  . Marital Status: Single    Spouse Name: N/A  . Number of Children: N/A  . Years of Education: N/A   Occupational History  . Not on file.   Social History Main Topics  . Smoking status: Never Smoker   . Smokeless tobacco: Not on file  . Alcohol Use: No     Comment: Rare  . Drug Use: No  . Sexual Activity: Not on file   Other Topics Concern  . Not on file   Social History Narrative     PHYSICAL EXAM  Filed Vitals:   06/14/15 1054  BP: 144/86  Pulse: 74  Resp: 16  Height: 5\' 6"  (1.676 m)  Weight: 281 lb (127.461 kg)    Body mass index is 45.38 kg/(m^2).   General:  The patient is well-developed and well-nourished and in no acute distress  Neck: The neck is mildly tender at occiput  Neurologic Exam  Mental status: The patient is alert and oriented x 3 at the time of the examination.  Speech is normal.  Cranial nerves: Extraocular movements are full.Facial strength is normal.  Trapezius and sternocleidomastoid strength is normal. No dysarthria is noted.  The tongue is midline, and the patient has symmetric elevation of the soft palate. No obvious hearing deficits are noted.  Gait and station: Station and gait are normal.  Romberg is negative.     DIAGNOSTIC DATA (LABS, IMAGING, TESTING) - I reviewed patient records, labs, notes, testing and imaging myself where available.     ASSESSMENT AND PLAN  Chronic migraine  Neck pain  Obstructive sleep apnea  Abnormal brain MRI   1.    Botox 200 U as follows:   Frontalis (5 U x 4), nasalis/corrugators (5 U x 3), temporalis  (5 U x 9 - 5R, 4L), occipitalis (5 U x 6), splenius capitis (15 U x 2), trapezius (15 U x 2), C6C7 paraspinal muscles (10 U x 2), wasted (10 U) 2.    Ketorolac 60 mg IM x 1  3.    Continue Maxalt and/or IM Toradol if moderate or severe headache occurs. 4.     Continue CPAP at current pressures. 5.    To rule out subclinical progression c/w multiple sclerosis, we will need to check another MRI later this year.  If there are changes, we will have to reconsider the diagnosis of MS.   She will return to see me in 3 months for her next Botox injection, or sooner if she has new or worsening neurologic symptoms.    Pamela Murphy A. Felecia Shelling, MD, PhD 1/44/3154, 00:86 AM Certified in Neurology, Clinical Neurophysiology, Sleep Medicine, Pain Medicine and Neuroimaging  [facility] [address] [place], [place] [address] [phone] e

## 2015-09-11 DIAGNOSIS — J4520 Mild intermittent asthma, uncomplicated: Secondary | ICD-10-CM | POA: Insufficient documentation

## 2015-09-11 DIAGNOSIS — K219 Gastro-esophageal reflux disease without esophagitis: Secondary | ICD-10-CM | POA: Insufficient documentation

## 2015-09-12 ENCOUNTER — Telehealth: Payer: Self-pay | Admitting: Neurology

## 2015-09-12 NOTE — Telephone Encounter (Signed)
Patient will always be Pamela Murphy . Per Dr. Tonette Lederer.

## 2015-09-19 ENCOUNTER — Ambulatory Visit: Payer: BC Managed Care – PPO | Admitting: Neurology

## 2015-09-23 ENCOUNTER — Other Ambulatory Visit: Payer: Self-pay | Admitting: Neurology

## 2015-09-23 ENCOUNTER — Encounter: Payer: Self-pay | Admitting: Neurology

## 2015-09-23 ENCOUNTER — Ambulatory Visit (INDEPENDENT_AMBULATORY_CARE_PROVIDER_SITE_OTHER): Payer: BC Managed Care – PPO | Admitting: Neurology

## 2015-09-23 VITALS — BP 124/82 | HR 68 | Resp 14 | Ht 66.0 in | Wt 281.0 lb

## 2015-09-23 DIAGNOSIS — M542 Cervicalgia: Secondary | ICD-10-CM

## 2015-09-23 DIAGNOSIS — G4733 Obstructive sleep apnea (adult) (pediatric): Secondary | ICD-10-CM | POA: Diagnosis not present

## 2015-09-23 DIAGNOSIS — R5383 Other fatigue: Secondary | ICD-10-CM

## 2015-09-23 DIAGNOSIS — R9089 Other abnormal findings on diagnostic imaging of central nervous system: Secondary | ICD-10-CM

## 2015-09-23 DIAGNOSIS — G43709 Chronic migraine without aura, not intractable, without status migrainosus: Secondary | ICD-10-CM | POA: Diagnosis not present

## 2015-09-23 DIAGNOSIS — IMO0002 Reserved for concepts with insufficient information to code with codable children: Secondary | ICD-10-CM

## 2015-09-23 DIAGNOSIS — R930 Abnormal findings on diagnostic imaging of skull and head, not elsewhere classified: Secondary | ICD-10-CM

## 2015-09-23 MED ORDER — OXYCODONE-ACETAMINOPHEN 10-325 MG PO TABS: 1.0000 | ORAL_TABLET | Freq: Three times a day (TID) | ORAL | Status: DC | PRN

## 2015-09-23 NOTE — Progress Notes (Signed)
[facility]  PATIENT: Pamela Murphy DOB: 1971/01/12 REASON FOR VISIT: Chronic Migraine headaches   HISTORICAL  CHIEF COMPLAINT:  Chief Complaint  Patient presents with  . Migraines    Here for Botox inj.   Sts. h/a's have been some worse over the last several weeks, she thinks due to weather/stress/fim    HISTORY OF PRESENT ILLNESS:  Pamela Murphy is a 44 year old woman with a chronic migraine disorder, obstructive sleep apnea and abnormal MRI of the brain.     Chronic Migraine/neck pain:   She is doing well with only several HA's the past 3 months, many related to flights.   When present, migraines are worse on the right than the left.   When present, she has throbbing pain that is associated with photophobia and phonophobia.   Since 2010 or 2011,  she has been on Botox, 200 units in the muscles of the scalp and neck.  On Botox the headaches occur much less frequently. She has had less than one severe migraine a month over the past year while staying on Botox. She has had some aura without headaches but with nausea.   She no longer gets daily headaches. She has a more severe migraine she takes Toradol injections or Maxalt pills. She has tolerated Botox very well and has not had any complications.   Migraine history:  She has a long history of chronic migraines that were occurring almost every day (25-28 days/month) for 4 or more hours a day. Multiple medications were tried for prophylactic and abortive therapies with suboptimal response.    In the past, she has failed multiple medications including:  Antiepileptics (Topamax, Keppra, zonisamide), anti-depressants (Cymbalta, Prozac), multiple NSAID's, betas blockers (Inderal, atenolol), muscle relaxants (soma, tizanidine, cyclobenzaprine) in various combinations.   Once the headache occurs, she has tried triptan's with incomplete benefit much of the time. She has also tried opiates.   IM Toradol has been most effective when  one occurs.  Neck pain:   Her neck pain is doing better since changes in the Botox injection regimen.  When present, pain is axial without radiation to the arms.  MRI on 09/12/2014 showed mild degenerative changes at C5-C6 and C6-C7 with no definite nerve root compression.       OSA:  She has severe obstructive sleep apnea and uses CPAP nightly when home and sometimes on trips. She feels better when she uses it nightly and has with less hypersomnia.   She does not have any difficulty with the mask or with leakage. She brings it with her when she travels.   She has fatigue but denies much hypersomnia.    Abnormal MRI/?MS:  She was diagnosed with MS in the past and was on therapy for several years.  MRI  Shows small round foci predominantly in the subcortical and deep white matter in a nonspecific manner.  Her last MRI of the brain was in 2013 and was stable compared to the previous ones.  She had had paresthesias and fatigue in the past that contributed to her diagnosis.  We discussed that we need to recheck this area several years past. If there are significant changes we will need to reconsider the diagnosis of MS.   REVIEW OF SYSTEMS:  Constitutional: No fevers, chills, sweats, or change in appetite Eyes: No visual changes, double vision, eye pain Ear, nose and throat: No hearing loss, ear pain, nasal congestion, sore throat Cardiovascular: No chest pain, palpitations Respiratory:  No shortness of  breath at rest or with exertion.   No wheezes GastrointestinaI: No nausea, vomiting, diarrhea, abdominal pain, fecal incontinence Genitourinary:  No dysuria, urinary retention or frequency.  No nocturia.   Dysfunctional uterine bleeding Musculoskeletal:  No neck pain, back pain Integumentary: No rash, pruritus, skin lesions Neurological: as above Psychiatric: No depression at this time.  No anxiety Endocrine: No palpitations, diaphoresis, change in appetite, change in weigh or increased  thirst Hematologic/Lymphatic:   She has anemia due to dysfunctional uterine bleeding. Allergic/Immunologic: No itchy/runny eyes, nasal congestion, recent allergic reactions, rashes  ALLERGIES: Allergies  Allergen Reactions  . Cefaclor Rash    Other Reaction: neck swelling  . Prochlorperazine Maleate Rash    Other Reaction: jittery;tachycardia  . Sulfamethoxazole-Trimethoprim Nausea Only  . Tessalon Perles [Benzonatate] Rash    HOME MEDICATIONS: Outpatient Prescriptions Prior to Visit  Medication Sig Dispense Refill  . atenolol (TENORMIN) 25 MG tablet TAKE 1 TABLET EVERY DAY 90 tablet 4  . B-D 3CC LUER-LOK SYR 23GX1" 23G X 1" 3 ML MISC Use as needed with 1 1/2 inch needle (also dispense) 18 each 5  . Botulinum Toxin Type A (BOTOX) 200 UNITS SOLR MD to inject every 3 mos. for migraine headaches. 1 each 3  . ketorolac (TORADOL) 60 MG/2ML SOLN injection Inject 2 mLs (60 mg total) into the muscle once. 12 mL 11  . NEEDLE, DISP, 25 G 25G X 1-1/2" MISC     . ondansetron (ZOFRAN-ODT) 8 MG disintegrating tablet     . oxyCODONE-acetaminophen (PERCOCET) 10-325 MG per tablet Take 1 tablet by mouth every 8 (eight) hours as needed for pain. 1 pill every 4-6 hours as needed for breakthrough pain as needed 60 tablet 0  . rizatriptan (MAXALT-MLT) 10 MG disintegrating tablet Take 1 tablet (10 mg total) by mouth as needed for migraine (May repeat in 2 hours if needed. Max 3 tabs in 24 hours.). 12 tablet 1  . tiZANidine (ZANAFLEX) 4 MG tablet 0.5 tab (2 mg) by mouth every 8 hours    . YALE DISP NEEDLES 25GX1-1/2" 25G X 1-1/2" MISC Use for IM injections as needed 18 each 3  . pantoprazole (PROTONIX) 40 MG tablet Take 40 mg by mouth.    . Vitamin D, Ergocalciferol, (DRISDOL) 50000 UNITS CAPS capsule Take 50,000 Units by mouth once a week.     No facility-administered medications prior to visit.    PAST MEDICAL HISTORY: Past Medical History  Diagnosis Date  . Headache   . Hypertension   . Vision  abnormalities     PAST SURGICAL HISTORY: Past Surgical History  Procedure Laterality Date  . Sinus irrigation      FAMILY HISTORY: Family History  Problem Relation Age of Onset  . Breast cancer Mother   . Melanoma Mother   . Atrial fibrillation Mother   . Supraventricular tachycardia Mother   . Heart attack Father   . Hypertension Father   . Hyperlipidemia Father     SOCIAL HISTORY:  Social History   Social History  . Marital Status: Single    Spouse Name: N/A  . Number of Children: N/A  . Years of Education: N/A   Occupational History  . Not on file.   Social History Main Topics  . Smoking status: Never Smoker   . Smokeless tobacco: Not on file  . Alcohol Use: No     Comment: Rare  . Drug Use: No  . Sexual Activity: Not on file   Other Topics Concern  . Not  on file   Social History Narrative     PHYSICAL EXAM  Filed Vitals:   09/23/15 0847  BP: 124/82  Pulse: 68  Resp: 14  Height: 5\' 6"  (1.676 m)  Weight: 281 lb (127.461 kg)    Body mass index is 45.38 kg/(m^2).   General: The patient is well-developed and well-nourished and in no acute distress  Neck: The neck is mildly tender at occiput  Neurologic Exam  Mental status: The patient is alert and oriented x 3 at the time of the examination.  Speech is normal.  Cranial nerves: Extraocular movements are full.Facial strength is normal.  Trapezius and sternocleidomastoid strength is normal. No dysarthria is noted.  The tongue is midline, and the patient has symmetric elevation of the soft palate. No obvious hearing deficits are noted.  Gait and station: Station and gait are normal.  Romberg is negative.     DIAGNOSTIC DATA (LABS, IMAGING, TESTING) - I reviewed patient records, labs, notes, testing and imaging myself where available.     ASSESSMENT AND PLAN  Chronic migraine  Neck pain  Obstructive sleep apnea  Other fatigue  Abnormal brain MRI   1.    Botox 200 U:   Frontalis  (5 U x 4), nasalis/corrugators (5 U x 3), temporalis  (5 U x 9 - 5R, 4L), occipitalis (5 U x 6), splenius capitis (15 U x 2), trapezius (15 U x 2), C6C7 paraspinal muscles (10 U x 2), wasted (10 U) 2.    Continue Maxalt and/or IM Toradol if moderate or severe headache occurs. 3.    Continue CPAP at current pressures.  Try to wear when out of town also 4.    To rule out subclinical progression c/w multiple sclerosis, check Brain MRI.  If there are changes, we will have to reconsider the diagnosis of MS.  5.    She will return to see me in 3 months for her next Botox injection, or sooner if she has new or worsening neurologic symptoms.    Sanaai Doane A. Felecia Shelling, MD, PhD 63/11/6008, 9:32 AM Certified in Neurology, Clinical Neurophysiology, Sleep Medicine, Pain Medicine and Neuroimaging  [facility] [address] [place], [place] [address] [phone] e

## 2015-10-15 DIAGNOSIS — F4323 Adjustment disorder with mixed anxiety and depressed mood: Secondary | ICD-10-CM | POA: Insufficient documentation

## 2015-10-15 DIAGNOSIS — Z3200 Encounter for pregnancy test, result unknown: Secondary | ICD-10-CM | POA: Insufficient documentation

## 2015-10-15 DIAGNOSIS — R059 Cough, unspecified: Secondary | ICD-10-CM | POA: Insufficient documentation

## 2015-10-15 DIAGNOSIS — R05 Cough: Secondary | ICD-10-CM | POA: Insufficient documentation

## 2015-10-15 DIAGNOSIS — R9439 Abnormal result of other cardiovascular function study: Secondary | ICD-10-CM | POA: Insufficient documentation

## 2015-10-16 DIAGNOSIS — I517 Cardiomegaly: Secondary | ICD-10-CM | POA: Insufficient documentation

## 2015-10-30 ENCOUNTER — Ambulatory Visit (INDEPENDENT_AMBULATORY_CARE_PROVIDER_SITE_OTHER): Payer: BC Managed Care – PPO

## 2015-10-30 DIAGNOSIS — R930 Abnormal findings on diagnostic imaging of skull and head, not elsewhere classified: Secondary | ICD-10-CM

## 2015-10-30 DIAGNOSIS — R9089 Other abnormal findings on diagnostic imaging of central nervous system: Secondary | ICD-10-CM

## 2015-10-30 DIAGNOSIS — G43709 Chronic migraine without aura, not intractable, without status migrainosus: Secondary | ICD-10-CM

## 2015-10-30 DIAGNOSIS — IMO0002 Reserved for concepts with insufficient information to code with codable children: Secondary | ICD-10-CM

## 2015-11-01 ENCOUNTER — Telehealth: Payer: Self-pay | Admitting: *Deleted

## 2015-11-01 NOTE — Telephone Encounter (Signed)
I have spoken with [facility] and requested Gordana's old mri brain and c-spine be mailed to Dr. Felecia Shelling.  I have spoken with Maha and advised that recent mri still shows many spots; that RAS needs to compare recent mri with the last one done at [place], then will call back with a new report.  She verbalized understanding of same/fim

## 2015-11-01 NOTE — Telephone Encounter (Signed)
-----   Message from Britt Bottom, MD sent at 11/01/2015 12:16 PM EST ----- MRI of the brain again shows many spots. We will get the old MRI from [facility] to compare side-by-side.

## 2015-11-04 ENCOUNTER — Encounter: Payer: Self-pay | Admitting: Neurology

## 2015-11-06 ENCOUNTER — Telehealth: Payer: Self-pay | Admitting: Neurology

## 2015-11-06 DIAGNOSIS — Z1239 Encounter for other screening for malignant neoplasm of breast: Secondary | ICD-10-CM | POA: Insufficient documentation

## 2015-11-06 DIAGNOSIS — Z23 Encounter for immunization: Secondary | ICD-10-CM | POA: Insufficient documentation

## 2015-11-06 DIAGNOSIS — E079 Disorder of thyroid, unspecified: Secondary | ICD-10-CM | POA: Insufficient documentation

## 2015-11-06 DIAGNOSIS — G43019 Migraine without aura, intractable, without status migrainosus: Secondary | ICD-10-CM | POA: Insufficient documentation

## 2015-11-06 NOTE — Telephone Encounter (Signed)
I have spoken with the mri tech on our mobile unit--she will get a cd ready for Pamela Murphy to pick up this morning.  I have spoken with [name] and let her know to pick the cd up at the front desk./fim

## 2015-11-06 NOTE — Telephone Encounter (Signed)
Patient called requesting a burned copy of MRI, has appointment with ENT at [facility] this afternoon and needs this cd to atake to appointment.

## 2015-11-12 DIAGNOSIS — R0602 Shortness of breath: Secondary | ICD-10-CM | POA: Insufficient documentation

## 2015-11-14 NOTE — Telephone Encounter (Signed)
I have spoken with Pamela Murphy and advised I have not seen the cd of her mri yet.  She verbalized understanding of same/fim

## 2015-11-14 NOTE — Telephone Encounter (Signed)
Patient called and stated that Dr. Felecia Shelling was supposed to get her old MRI and compare it to the recent one. She would like to know results of the comparison. Please call and advise.

## 2015-12-05 ENCOUNTER — Encounter: Payer: Self-pay | Admitting: Neurology

## 2015-12-10 ENCOUNTER — Encounter: Payer: Self-pay | Admitting: Neurology

## 2015-12-10 ENCOUNTER — Ambulatory Visit (INDEPENDENT_AMBULATORY_CARE_PROVIDER_SITE_OTHER): Payer: BC Managed Care – PPO | Admitting: Neurology

## 2015-12-10 VITALS — BP 130/90 | HR 70 | Resp 16 | Ht 66.0 in | Wt 266.0 lb

## 2015-12-10 DIAGNOSIS — G43709 Chronic migraine without aura, not intractable, without status migrainosus: Secondary | ICD-10-CM | POA: Diagnosis not present

## 2015-12-10 DIAGNOSIS — H6980 Other specified disorders of Eustachian tube, unspecified ear: Secondary | ICD-10-CM | POA: Insufficient documentation

## 2015-12-10 DIAGNOSIS — IMO0002 Reserved for concepts with insufficient information to code with codable children: Secondary | ICD-10-CM

## 2015-12-10 DIAGNOSIS — G43019 Migraine without aura, intractable, without status migrainosus: Secondary | ICD-10-CM | POA: Diagnosis not present

## 2015-12-10 DIAGNOSIS — M542 Cervicalgia: Secondary | ICD-10-CM

## 2015-12-10 DIAGNOSIS — H6990 Unspecified Eustachian tube disorder, unspecified ear: Secondary | ICD-10-CM | POA: Insufficient documentation

## 2015-12-10 NOTE — Progress Notes (Signed)
[facility]  PATIENT: Pamela Murphy DOB: [birth year] REASON FOR VISIT: Chronic Migraine headaches   HISTORICAL  CHIEF COMPLAINT:  Chief Complaint  Patient presents with  . Migraines    Sts. she has had a h/a since Friday--no relief with Toradol, Percodet, Maxalt./fim    HISTORY OF PRESENT ILLNESS:  Pamela Murphy is a 45 year old woman with a chronic migraine disorder with a severe headache and neck pain x 4 days.    Pain is on the right and in the occiput.  Pain is throbbing but with a constant component.  Pain increases with coughing or sneezing.  She denies visual changes, N/V.    She had some Nausea yesterday, helped by Zofran.      A few times over the past few years, we have done occipital nerve blocks and/or trigger point injections have helped in the past.      Chronic Migraine/neck pain:   She was doing well after last Botox until last week.   Next Botox is in 2-3 weeks.   Migraine history:  She has a long history of chronic migraines that were occurring almost every day (25-28 days/month) for 4 or more hours a day. Multiple medications were tried for prophylactic and abortive therapies with suboptimal response.    In the past, she has failed multiple medications including:  Antiepileptics (Topamax, Keppra, zonisamide), anti-depressants (Cymbalta, Prozac), multiple NSAID's, betas blockers (Inderal, atenolol), muscle relaxants (soma, tizanidine, cyclobenzaprine) in various combinations.   Once the headache occurs, she has tried triptan's with incomplete benefit much of the time. She has also tried opiates.   IM Toradol has been most effective when one occurs.  Other:    MRI on 09/12/2014 showed mild degenerative changes at C5-C6 and C6-C7 with no definite nerve root compression.         REVIEW OF SYSTEMS:  Constitutional: No fevers, chills, sweats, or change in appetite Eyes: No visual changes, double vision, eye pain Ear, nose and throat: No hearing loss, ear  pain, nasal congestion, sore throat Cardiovascular: No chest pain, palpitations Respiratory:  No shortness of breath at rest or with exertion.   No wheezes GastrointestinaI: No nausea, vomiting, diarrhea, abdominal pain, fecal incontinence Genitourinary:  No dysuria, urinary retention or frequency.  No nocturia.   Dysfunctional uterine bleeding Musculoskeletal:  No neck pain, back pain Integumentary: No rash, pruritus, skin lesions Neurological: as above Psychiatric: No depression at this time.  No anxiety Endocrine: No palpitations, diaphoresis, change in appetite, change in weigh or increased thirst Hematologic/Lymphatic:   She has anemia due to dysfunctional uterine bleeding. Allergic/Immunologic: No itchy/runny eyes, nasal congestion, recent allergic reactions, rashes  ALLERGIES: Allergies  Allergen Reactions  . Cefaclor Rash    Other Reaction: neck swelling  . Prochlorperazine Maleate Rash    Other Reaction: jittery;tachycardia  . Sulfamethoxazole-Trimethoprim Nausea Only  . Tessalon Perles [Benzonatate] Rash    HOME MEDICATIONS: Outpatient Prescriptions Prior to Visit  Medication Sig Dispense Refill  . atenolol (TENORMIN) 25 MG tablet TAKE 1 TABLET EVERY DAY 90 tablet 4  . B-D 3CC LUER-LOK SYR 23GX1" 23G X 1" 3 ML MISC Use as needed with 1 1/2 inch needle (also dispense) 18 each 5  . Botulinum Toxin Type A (BOTOX) 200 UNITS SOLR MD to inject every 3 mos. for migraine headaches. 1 each 3  . cholecalciferol (VITAMIN D) 1000 UNITS tablet Take 2,000 Units by mouth daily.    Marland Kitchen ketorolac (TORADOL) 60 MG/2ML SOLN injection Inject 2 mLs (60  mg total) into the muscle once. 12 mL 11  . NEEDLE, DISP, 25 G 25G X 1-1/2" MISC     . ondansetron (ZOFRAN-ODT) 8 MG disintegrating tablet     . oxyCODONE-acetaminophen (PERCOCET) 10-325 MG tablet Take 1 tablet by mouth every 8 (eight) hours as needed for pain. 1 pill every 4-6 hours as needed for breakthrough pain as needed 60 tablet 0  .  rizatriptan (MAXALT-MLT) 10 MG disintegrating tablet TAKE 1 TABLET AS NEEDED FOR MIGRAINE ( MAY REPEAT IN 2 HOURS IF NEEDED) MAX 3 TABS IN 24 HRS 12 tablet 6  . tiZANidine (ZANAFLEX) 4 MG tablet TAKE 1 TABLET THREE TIMES A DAY 90 tablet 6  . YALE DISP NEEDLES 25GX1-1/2" 25G X 1-1/2" MISC Use for IM injections as needed 18 each 3  . Multiple Vitamins-Minerals (MULTIVITAMIN WITH MINERALS) tablet Take by mouth.    . pantoprazole (PROTONIX) 40 MG tablet Take 40 mg by mouth.     No facility-administered medications prior to visit.    PAST MEDICAL HISTORY: Past Medical History  Diagnosis Date  . Headache   . Hypertension   . Vision abnormalities     PAST SURGICAL HISTORY: Past Surgical History  Procedure Laterality Date  . Sinus irrigation      FAMILY HISTORY: Family History  Problem Relation Age of Onset  . Breast cancer Mother   . Melanoma Mother   . Atrial fibrillation Mother   . Supraventricular tachycardia Mother   . Heart attack Father   . Hypertension Father   . Hyperlipidemia Father     SOCIAL HISTORY:  Social History   Social History  . Marital Status: Single    Spouse Name: N/A  . Number of Children: N/A  . Years of Education: N/A   Occupational History  . Not on file.   Social History Main Topics  . Smoking status: Never Smoker   . Smokeless tobacco: Not on file  . Alcohol Use: No     Comment: Rare  . Drug Use: No  . Sexual Activity: Not on file   Other Topics Concern  . Not on file   Social History Narrative     PHYSICAL EXAM  Filed Vitals:   12/10/15 1305  BP: 130/90  Pulse: 70  Resp: 16  Height: 5\' 6"  (1.676 m)  Weight: 266 lb (120.657 kg)    Body mass index is 42.95 kg/(m^2).   General: The patient is well-developed and well-nourished and in no acute distress  Neck: The neck is very tender at occiput, right > left.  Also tender over lower cervical paraspinal muscles and trapezius muscles.    Neurologic Exam  Mental status:  The patient is alert and oriented x 3 at the time of the examination.  Speech is normal.  Cranial nerves: Extraocular movements are full.Facial strength is normal.  Trapezius and sternocleidomastoid strength is normal. No dysarthria is noted.  The tongue is midline, and the patient has symmetric elevation of the soft palate. No obvious hearing deficits are noted.  Gait and station: Station and gait are normal.  Romberg is negative.     DIAGNOSTIC DATA (LABS, IMAGING, TESTING) - I reviewed patient records, labs, notes, testing and imaging myself where available.     ASSESSMENT AND PLAN  Common migraine with intractable migraine  Chronic migraine  Neck pain   1.    Trigger point injection bilateral splenius capitis muscles, bilateral C6-C7 paraspinal muscles and bilateral trapezius muscles with 80 mg Depo-Medrol in 6  mL Marcaine. She tolerated the procedure well and there were no complications. Pain was improved within a few minutes. 2.    She will return to see me in 3 weeks for her next Botox injection, or sooner if she has new or worsening neurologic symptoms.    Richard A. Felecia Shelling, MD, PhD [id number], [id number] PM Certified in Neurology, Clinical Neurophysiology, Sleep Medicine, Pain Medicine and Neuroimaging  [facility] [address] [place], [place] [address] [phone] e

## 2015-12-13 ENCOUNTER — Other Ambulatory Visit: Payer: Self-pay | Admitting: Neurology

## 2015-12-13 MED ORDER — ONDANSETRON 8 MG PO TBDP: 8.0000 mg | ORAL_TABLET | Freq: Three times a day (TID) | ORAL | Status: DC | PRN

## 2015-12-13 NOTE — Telephone Encounter (Signed)
Pt called requesting refill for ondansetron (ZOFRAN-ODT) 8 MG disintegrating tablet . Pt is asking if it could be called in today with the change in the barometric pressure and rain she could have medication on hand if HA gets worse

## 2015-12-24 ENCOUNTER — Encounter: Payer: Self-pay | Admitting: Neurology

## 2015-12-24 ENCOUNTER — Ambulatory Visit (INDEPENDENT_AMBULATORY_CARE_PROVIDER_SITE_OTHER): Payer: BC Managed Care – PPO | Admitting: Neurology

## 2015-12-24 VITALS — BP 146/90 | HR 64 | Resp 16 | Ht 66.0 in | Wt 266.0 lb

## 2015-12-24 DIAGNOSIS — M542 Cervicalgia: Secondary | ICD-10-CM

## 2015-12-24 DIAGNOSIS — G43709 Chronic migraine without aura, not intractable, without status migrainosus: Secondary | ICD-10-CM

## 2015-12-24 DIAGNOSIS — G4733 Obstructive sleep apnea (adult) (pediatric): Secondary | ICD-10-CM

## 2015-12-24 DIAGNOSIS — R9089 Other abnormal findings on diagnostic imaging of central nervous system: Secondary | ICD-10-CM

## 2015-12-24 DIAGNOSIS — IMO0002 Reserved for concepts with insufficient information to code with codable children: Secondary | ICD-10-CM

## 2015-12-24 DIAGNOSIS — R930 Abnormal findings on diagnostic imaging of skull and head, not elsewhere classified: Secondary | ICD-10-CM

## 2015-12-24 DIAGNOSIS — G43019 Migraine without aura, intractable, without status migrainosus: Secondary | ICD-10-CM

## 2015-12-24 NOTE — Progress Notes (Signed)
[facility]  PATIENT: Pamela Murphy DOB: [birth year] REASON FOR VISIT: Chronic Migraine headaches   HISTORICAL  CHIEF COMPLAINT:  Chief Complaint  Patient presents with  . Migraines    Here today for Botox inj. for migraines./fim    HISTORY OF PRESENT ILLNESS:  Pamela Murphy is a 45 year old woman with a chronic migraine disorder, obstructive sleep apnea and abnormal MRI of the brain.     Chronic Migraine/neck pain:   She reports doing well with only several HA's the past 3 months, though she has one right now.    Migraines are more common with flights.   When present, migraines are usually worse on the right than the left.   When present, she has throbbing pain that is associated with photophobia and phonophobia.   She has been on Botox, 200 units in the muscles of the scalp and neck since 2010.  On Botox the headaches occur much less frequently. She has had less than one severe migraine a month over the past year while staying on Botox.    She no longer gets daily headaches. She has a more severe migraine she takes Toradol injections or Maxalt pills. She has tolerated Botox very well and has not had any complications.   Migraine history:  She has a long history of chronic migraines that were occurring almost every day (25-28 days/month) for 4 or more hours a day. Multiple medications were tried for prophylactic and abortive therapies with suboptimal response.    In the past, she has failed multiple medications including:  Antiepileptics (Topamax, Keppra, zonisamide), anti-depressants (Cymbalta, Prozac), multiple NSAID's, betas blockers (Inderal, atenolol), muscle relaxants (soma, tizanidine, cyclobenzaprine) in various combinations.   Once the headache occurs, she has tried triptan's with incomplete benefit much of the time. She has also tried opiates.   IM Toradol has been most effective when one occurs.  Neck pain:   Her neck pain is also better since going to higher  dose of Botox.  When present, pain is axial without radiation to the arms.  MRI on 09/12/2014 showed mild degenerative changes at C5-C6 and C6-C7 with no definite nerve root compression.       OSA:  She has severe obstructive sleep apnea on one OSA (though mild on another)  and uses CPAP nightly when home and on longer trips. She feels better when she uses it nightly and has with less hypersomnia.   She does not have any difficulty with the mask or with leakage.   Currently denies hypersomnia  Abnormal MRI/?MS:  She was diagnosed with MS in the past and was on interferon therapy for several years.   I "undiagnosed her" around 2010 and she got a second opinion from Dr. Terie Purser who also concurred.  MRI shows small round foci predominantly in the subcortical and deep white matter in a nonspecific manner.  Her last MRI of the brain was in 2016 and was stable compared to the previous ones.  She had had paresthesias and fatigue in the past that contributed to her diagnosis.   We have discussed that she has been stable times many years or therapy that MS is significantly less likely.   REVIEW OF SYSTEMS:  Constitutional: No fevers, chills, sweats, or change in appetite Eyes: No visual changes, double vision, eye pain Ear, nose and throat: No hearing loss, ear pain, nasal congestion, sore throat Cardiovascular: No chest pain, palpitations Respiratory:  No shortness of breath at rest or with exertion.  No wheezes GastrointestinaI: No nausea, vomiting, diarrhea, abdominal pain, fecal incontinence Genitourinary:  No dysuria, urinary retention or frequency.  No nocturia.   Dysfunctional uterine bleeding Musculoskeletal:  No neck pain, back pain Integumentary: No rash, pruritus, skin lesions Neurological: as above Psychiatric: No depression at this time.  No anxiety Endocrine: No palpitations, diaphoresis, change in appetite, change in weigh or increased thirst Hematologic/Lymphatic:   She has anemia due to  dysfunctional uterine bleeding. Allergic/Immunologic: No itchy/runny eyes, nasal congestion, recent allergic reactions, rashes  ALLERGIES: Allergies  Allergen Reactions  . Cefaclor Rash    Other Reaction: neck swelling  . Prochlorperazine Maleate Rash    Other Reaction: jittery;tachycardia  . Sulfamethoxazole-Trimethoprim Nausea Only  . Tessalon Perles [Benzonatate] Rash    HOME MEDICATIONS: Outpatient Prescriptions Prior to Visit  Medication Sig Dispense Refill  . atenolol (TENORMIN) 25 MG tablet TAKE 1 TABLET EVERY DAY 90 tablet 4  . B-D 3CC LUER-LOK SYR 23GX1" 23G X 1" 3 ML MISC Use as needed with 1 1/2 inch needle (also dispense) 18 each 5  . Botulinum Toxin Type A (BOTOX) 200 UNITS SOLR MD to inject every 3 mos. for migraine headaches. 1 each 3  . cholecalciferol (VITAMIN D) 1000 UNITS tablet Take 2,000 Units by mouth daily.    Marland Kitchen ketorolac (TORADOL) 60 MG/2ML SOLN injection Inject 2 mLs (60 mg total) into the muscle once. 12 mL 11  . Multiple Vitamins-Minerals (MULTIVITAMIN WITH MINERALS) tablet Take by mouth.    Marland Kitchen NEEDLE, DISP, 25 G 25G X 1-1/2" MISC     . ondansetron (ZOFRAN-ODT) 8 MG disintegrating tablet Take 1 tablet (8 mg total) by mouth every 8 (eight) hours as needed for nausea or vomiting. 30 tablet 3  . oxyCODONE-acetaminophen (PERCOCET) 10-325 MG tablet Take 1 tablet by mouth every 8 (eight) hours as needed for pain. 1 pill every 4-6 hours as needed for breakthrough pain as needed 60 tablet 0  . rizatriptan (MAXALT-MLT) 10 MG disintegrating tablet TAKE 1 TABLET AS NEEDED FOR MIGRAINE ( MAY REPEAT IN 2 HOURS IF NEEDED) MAX 3 TABS IN 24 HRS 12 tablet 6  . tiZANidine (ZANAFLEX) 4 MG tablet TAKE 1 TABLET THREE TIMES A DAY 90 tablet 6  . YALE DISP NEEDLES 25GX1-1/2" 25G X 1-1/2" MISC Use for IM injections as needed 18 each 3  . pantoprazole (PROTONIX) 40 MG tablet Take 40 mg by mouth.     No facility-administered medications prior to visit.    PAST MEDICAL HISTORY: Past  Medical History  Diagnosis Date  . Headache   . Hypertension   . Vision abnormalities     PAST SURGICAL HISTORY: Past Surgical History  Procedure Laterality Date  . Sinus irrigation      FAMILY HISTORY: Family History  Problem Relation Age of Onset  . Breast cancer Mother   . Melanoma Mother   . Atrial fibrillation Mother   . Supraventricular tachycardia Mother   . Heart attack Father   . Hypertension Father   . Hyperlipidemia Father     SOCIAL HISTORY:  Social History   Social History  . Marital Status: Single    Spouse Name: N/A  . Number of Children: N/A  . Years of Education: N/A   Occupational History  . Not on file.   Social History Main Topics  . Smoking status: Never Smoker   . Smokeless tobacco: Not on file  . Alcohol Use: No     Comment: Rare  . Drug Use: No  .  Sexual Activity: Not on file   Other Topics Concern  . Not on file   Social History Narrative     PHYSICAL EXAM  Filed Vitals:   12/24/15 0831  BP: 146/90  Pulse: 64  Resp: 16  Height: 5\' 6"  (1.676 m)  Weight: 266 lb (120.657 kg)    Body mass index is 42.95 kg/(m^2).   General: The patient is well-developed and well-nourished and in no acute distress  Neck: The neck is mildly tender at occiput  Neurologic Exam  Mental status: The patient is alert and oriented x 3 at the time of the examination.  Speech is normal.  Cranial nerves: Extraocular movements are full.Facial strength is normal.  Trapezius and sternocleidomastoid strength is normal. No dysarthria is noted.   No obvious hearing deficits are noted.  Gait and station: Station and gait are normal.  Romberg is negative.     DIAGNOSTIC DATA (LABS, IMAGING, TESTING) - I reviewed patient records, labs, notes, testing and imaging myself where available.     ASSESSMENT AND PLAN  Chronic migraine  Common migraine with intractable migraine  Obstructive apnea  Abnormal brain MRI  Neck pain   1.    Botox  200 U:   Frontalis (5 U x 4), nasalis/corrugators (5 U x 3), temporalis  (5 U x 9 - 5R, 4L), occipitalis (5 U x 6), splenius capitis (15 U x 2), trapezius (15 U x 2), C6C7 paraspinal muscles (10 U x 2), wasted (10 U) 2.    For breakthrough migraine, continue Maxalt and/or IM Toradol if moderate or severe headache occurs. 3.    Continue CPAP at current pressures.   4.    She will return to see me in 3 months for her next Botox injection, or sooner if she has new or worsening neurologic symptoms.    Darianna Ravinder A. Felecia Shelling, MD, PhD [id number], [id number] AM Certified in Neurology, Clinical Neurophysiology, Sleep Medicine, Pain Medicine and Neuroimaging  [facility] [address], [place], [place] [address] [phone]----------------------

## 2016-01-16 ENCOUNTER — Encounter: Payer: Self-pay | Admitting: Neurology

## 2016-01-26 DIAGNOSIS — J0100 Acute maxillary sinusitis, unspecified: Secondary | ICD-10-CM | POA: Insufficient documentation

## 2016-01-27 ENCOUNTER — Telehealth: Payer: Self-pay | Admitting: Neurology

## 2016-01-27 NOTE — Telephone Encounter (Signed)
Pt called inquiring if botox has been approved. She said insurance rejected the last visit. She has been waiting to hear back regarding this matter.

## 2016-01-28 NOTE — Telephone Encounter (Signed)
Spoke with patient and gave her an update of [employer] and coverage.

## 2016-03-16 ENCOUNTER — Telehealth: Payer: Self-pay | Admitting: Neurology

## 2016-03-16 NOTE — Telephone Encounter (Signed)
[employer] [phone]/Fax# [phone] called to request clinical information regarding BOTOX. Per Andee Poles, form was faxed on 03/12/16, confirmation received. Charlena Cross asks that form be re-faxed to her attention.

## 2016-03-16 NOTE — Telephone Encounter (Signed)
Information has been sent again.

## 2016-03-17 ENCOUNTER — Ambulatory Visit: Payer: BC Managed Care – PPO | Admitting: Adult Health

## 2016-03-17 ENCOUNTER — Encounter: Payer: Self-pay | Admitting: Neurology

## 2016-03-17 ENCOUNTER — Ambulatory Visit (INDEPENDENT_AMBULATORY_CARE_PROVIDER_SITE_OTHER): Payer: BC Managed Care – PPO | Admitting: Neurology

## 2016-03-17 VITALS — BP 146/78 | HR 80 | Resp 16 | Wt 273.0 lb

## 2016-03-17 DIAGNOSIS — G4733 Obstructive sleep apnea (adult) (pediatric): Secondary | ICD-10-CM

## 2016-03-17 DIAGNOSIS — IMO0002 Reserved for concepts with insufficient information to code with codable children: Secondary | ICD-10-CM

## 2016-03-17 DIAGNOSIS — G43019 Migraine without aura, intractable, without status migrainosus: Secondary | ICD-10-CM

## 2016-03-17 DIAGNOSIS — G43709 Chronic migraine without aura, not intractable, without status migrainosus: Secondary | ICD-10-CM

## 2016-03-17 DIAGNOSIS — M542 Cervicalgia: Secondary | ICD-10-CM

## 2016-03-17 DIAGNOSIS — R9089 Other abnormal findings on diagnostic imaging of central nervous system: Secondary | ICD-10-CM

## 2016-03-17 NOTE — Progress Notes (Signed)
[facility]  PATIENT: Pamela Murphy DOB: Apr 07, 1971 REASON FOR VISIT: Chronic Migraine headaches   HISTORICAL  CHIEF COMPLAINT:  Chief Complaint  Patient presents with  . Headache    C/O increased h/a over the last 4-5 days.  Requesting onb today if appropriate/fim    HISTORY OF PRESENT ILLNESS:  Pamela Murphy is a 45 year old woman with a h/o chronic migraine disorder now reporting a more constant headache the past few days.   Pain started 4-5 days ago and is bifrontal.   There is also neck pain on both sides (occipital).   Pain is throbbing but with a constant component.  Pain increases with coughing or sneezing.  She denies visual changes, N/V.    A few times over the past few years, last itme January 2017, we have done occipital nerve blocks and/or trigger point injections with benefit.     Chronic Migraine/neck pain:   She was doing ok after last Botox until last week.  However, she has had some more headaches than usual the last cycle.    Next Botox is next week.   Migraine history:  She has a long history of chronic migraines that were occurring almost every day (25-28 days/month) for 4 or more hours a day. Multiple medications were tried for prophylactic and abortive therapies with suboptimal response.    In the past, she has failed multiple medications including:  Antiepileptics (Topamax, Keppra, zonisamide), anti-depressants (Cymbalta, Prozac), multiple NSAID's, betas blockers (Inderal, atenolol), muscle relaxants (soma, tizanidine, cyclobenzaprine) in various combinations.   Once the headache occurs, she has tried triptan's with incomplete benefit much of the time. She has also tried opiates.   IM Toradol has been most effective when one occurs.  Other:    MRI on 09/12/2014 showed mild degenerative changes at C5-C6 and C6-C7 with no definite nerve root compression.       MRI of the brain has shown multiple T2/FLAIR hyperintense foci in a nonspecific pattern. The  MRI has been stable over many years.     REVIEW OF SYSTEMS:  Constitutional: No fevers, chills, sweats, or change in appetite Eyes: No visual changes, double vision, eye pain Ear, nose and throat: No hearing loss, ear pain, nasal congestion, sore throat Cardiovascular: No chest pain, palpitations Respiratory:  No shortness of breath at rest or with exertion.   No wheezes GastrointestinaI: No nausea, vomiting, diarrhea, abdominal pain, fecal incontinence Genitourinary:  No dysuria, urinary retention or frequency.  No nocturia.   Dysfunctional uterine bleeding Musculoskeletal:  No neck pain, back pain Integumentary: No rash, pruritus, skin lesions Neurological: as above Psychiatric: No depression at this time.  No anxiety Endocrine: No palpitations, diaphoresis, change in appetite, change in weigh or increased thirst Hematologic/Lymphatic:   She has anemia due to dysfunctional uterine bleeding. Allergic/Immunologic: No itchy/runny eyes, nasal congestion, recent allergic reactions, rashes  ALLERGIES: Allergies  Allergen Reactions  . Cefaclor Rash    Other Reaction: neck swelling  . Prochlorperazine Maleate Rash    Other Reaction: jittery;tachycardia  . Sulfamethoxazole-Trimethoprim Nausea Only  . Tessalon Perles [Benzonatate] Rash    HOME MEDICATIONS: Outpatient Prescriptions Prior to Visit  Medication Sig Dispense Refill  . atenolol (TENORMIN) 25 MG tablet TAKE 1 TABLET EVERY DAY 90 tablet 4  . B-D 3CC LUER-LOK SYR 23GX1" 23G X 1" 3 ML MISC Use as needed with 1 1/2 inch needle (also dispense) 18 each 5  . Botulinum Toxin Type A (BOTOX) 200 UNITS SOLR MD to inject every  3 mos. for migraine headaches. 1 each 3  . buPROPion (WELLBUTRIN XL) 150 MG 24 hr tablet Take 150 mg by mouth.    . cholecalciferol (VITAMIN D) 1000 UNITS tablet Take 2,000 Units by mouth daily.    Marland Kitchen ketorolac (TORADOL) 60 MG/2ML SOLN injection Inject 2 mLs (60 mg total) into the muscle once. 12 mL 11  . Multiple  Vitamins-Minerals (MULTIVITAMIN WITH MINERALS) tablet Take by mouth.    Marland Kitchen NEEDLE, DISP, 25 G 25G X 1-1/2" MISC     . ondansetron (ZOFRAN-ODT) 8 MG disintegrating tablet Take 1 tablet (8 mg total) by mouth every 8 (eight) hours as needed for nausea or vomiting. 30 tablet 3  . oxyCODONE-acetaminophen (PERCOCET) 10-325 MG tablet Take 1 tablet by mouth every 8 (eight) hours as needed for pain. 1 pill every 4-6 hours as needed for breakthrough pain as needed 60 tablet 0  . pantoprazole (PROTONIX) 40 MG tablet Take 40 mg by mouth.    . rizatriptan (MAXALT-MLT) 10 MG disintegrating tablet TAKE 1 TABLET AS NEEDED FOR MIGRAINE ( MAY REPEAT IN 2 HOURS IF NEEDED) MAX 3 TABS IN 24 HRS 12 tablet 6  . tiZANidine (ZANAFLEX) 4 MG tablet TAKE 1 TABLET THREE TIMES A DAY 90 tablet 6  . YALE DISP NEEDLES 25GX1-1/2" 25G X 1-1/2" MISC Use for IM injections as needed 18 each 3   No facility-administered medications prior to visit.    PAST MEDICAL HISTORY: Past Medical History  Diagnosis Date  . Headache   . Hypertension   . Vision abnormalities     PAST SURGICAL HISTORY: Past Surgical History  Procedure Laterality Date  . Sinus irrigation      FAMILY HISTORY: Family History  Problem Relation Age of Onset  . Breast cancer Mother   . Melanoma Mother   . Atrial fibrillation Mother   . Supraventricular tachycardia Mother   . Heart attack Father   . Hypertension Father   . Hyperlipidemia Father     SOCIAL HISTORY:  Social History   Social History  . Marital Status: Single    Spouse Name: N/A  . Number of Children: N/A  . Years of Education: N/A   Occupational History  . Not on file.   Social History Main Topics  . Smoking status: Never Smoker   . Smokeless tobacco: Not on file  . Alcohol Use: No     Comment: Rare  . Drug Use: No  . Sexual Activity: Not on file   Other Topics Concern  . Not on file   Social History Narrative     PHYSICAL EXAM  Filed Vitals:   03/17/16 1409    BP: 146/78  Pulse: 80  Resp: 16  Weight: 273 lb (123.832 kg)    Body mass index is 44.08 kg/(m^2).   General: The patient is well-developed and well-nourished and in no acute distress  Neck: The neck is tender at occiput, right > left and over lower cervical paraspinal muscles > trapezius muscles.    Neurologic Exam  Mental status: The patient is alert and oriented x 3 at the time of the examination.  Speech is normal.  Cranial nerves: Extraocular movements are full.Facial strength is normal.  Trapezius and sternocleidomastoid strength is normal. No dysarthria is noted.  The tongue is midline, and the patient has symmetric elevation of the soft palate. No obvious hearing deficits are noted.  Gait and station: Station and gait are normal.  Romberg is negative.     DIAGNOSTIC  DATA (LABS, IMAGING, TESTING) - I reviewed patient records, labs, notes, testing and imaging myself where available.     ASSESSMENT AND PLAN  Common migraine with intractable migraine  Chronic migraine  Obstructive apnea  Abnormal brain MRI  Neck pain   1.    Trigger point injection bilateral splenius capitis muscles, bilateral C6-C7 paraspinal muscles and  with 80 mg Depo-Medrol in 6 mL Marcaine. She tolerated the procedure well and there were no complications. Pain improved within a few minutes. 2.    She will return to see me next week as scheduled for Botox injection, or sooner if she has new or worsening neurologic symptoms.    Sharlyne Koeneman A. Felecia Shelling, MD, PhD [id number], 0000000 PM Certified in Neurology, Clinical Neurophysiology, Sleep Medicine, Pain Medicine and Neuroimaging  [facility] [address] [place], [place] [address] [phone]

## 2016-03-17 NOTE — Telephone Encounter (Signed)
Patient was in the office today asking about the status of botox. I called to relay that she had been approved and she did not answer. I left a VM asking her to return my call.

## 2016-03-17 NOTE — Telephone Encounter (Signed)
Pt returned Danielle's call. Please call her, she is concerned medication will not arrive in time for her appt on 5/1 @ 8:40

## 2016-03-17 NOTE — Telephone Encounter (Signed)
Spoke with patient she is approved for B/B per janet for botox.

## 2016-03-23 ENCOUNTER — Ambulatory Visit (INDEPENDENT_AMBULATORY_CARE_PROVIDER_SITE_OTHER): Payer: BC Managed Care – PPO | Admitting: Neurology

## 2016-03-23 ENCOUNTER — Encounter: Payer: Self-pay | Admitting: Neurology

## 2016-03-23 VITALS — BP 126/80 | HR 66 | Resp 14 | Ht 66.0 in | Wt 269.0 lb

## 2016-03-23 DIAGNOSIS — IMO0002 Reserved for concepts with insufficient information to code with codable children: Secondary | ICD-10-CM

## 2016-03-23 DIAGNOSIS — M542 Cervicalgia: Secondary | ICD-10-CM

## 2016-03-23 DIAGNOSIS — G4733 Obstructive sleep apnea (adult) (pediatric): Secondary | ICD-10-CM

## 2016-03-23 DIAGNOSIS — G43709 Chronic migraine without aura, not intractable, without status migrainosus: Secondary | ICD-10-CM | POA: Diagnosis not present

## 2016-03-23 DIAGNOSIS — R930 Abnormal findings on diagnostic imaging of skull and head, not elsewhere classified: Secondary | ICD-10-CM

## 2016-03-23 DIAGNOSIS — R9089 Other abnormal findings on diagnostic imaging of central nervous system: Secondary | ICD-10-CM

## 2016-03-23 NOTE — Progress Notes (Signed)
[facility]  PATIENT: Pamela Murphy DOB: [birth year] REASON FOR VISIT: Chronic Migraine headaches   HISTORICAL  CHIEF COMPLAINT:  Chief Complaint  Patient presents with  . Migraines    Here for Botox to treat migraines./fim    HISTORY OF PRESENT ILLNESS:  Pamela Murphy is a 45 year old woman with a chronic migraine disorder, obstructive sleep apnea and abnormal MRI of the brain.   Lastly, she needed to come into the office for splenius capitis trigger point injections, occipital nerve blocks to help with headache pain that returned 2 weeks ago. She felt that the pain resolved over the next 24-48 hours and she currently does not have a headache.  Chronic Migraine/neck pain:   She generally does best the first 2 months of each 3 month cycle and she has had other times when the headache return during the third month.   Migraines are more common with flights.   When present, migraines are usually worse on the right than the left.   When present, she has throbbing pain that is associated with photophobia and phonophobia.   She has been on Botox, 200 units in the muscles of the scalp and neck since 2010.  On Botox the headaches occur much less frequently. She has had less than one severe migraine a month over the past year while staying on Botox.    She no longer gets daily headaches. She has a more severe migraine she takes Toradol injections or Maxalt pills. She has tolerated Botox very well and has not had any complications.   Migraine history:  She has a long history of chronic migraines that were occurring almost every day (25-28 days/month) for 4 or more hours a day. Multiple medications were tried for prophylactic and abortive therapies with suboptimal response.    In the past, she has failed multiple medications including:  Antiepileptics (Topamax, Keppra, zonisamide), anti-depressants (Cymbalta, Prozac), multiple NSAID's, betas blockers (Inderal, atenolol), muscle relaxants  (soma, tizanidine, cyclobenzaprine) in various combinations.   Once the headache occurs, she has tried triptan's with incomplete benefit much of the time. She has also tried opiates.   IM Toradol has been most effective when one occurs.  Neck pain:   Her neck pain is also better since going to higher dose of Botox.  When present, pain is axial without radiation to the arms.  MRI on 09/12/2014 showed mild degenerative changes at C5-C6 and C6-C7 with no definite nerve root compression.       OSA:  She has severe obstructive sleep apnea on one OSA (though mild on another)  and uses CPAP nightly when home and on longer trips. She feels better when she uses it nightly and has with less hypersomnia.   She does not have any difficulty with the mask or with leakage.   Currently denies hypersomnia  Abnormal MRI/?MS:  She was diagnosed with MS in the past and was on interferon therapy for several years.   I "undiagnosed her" around 2010 and she got a second opinion from Dr. Terie Purser who also concurred.  MRI shows small round foci predominantly in the subcortical and deep white matter in a nonspecific manner.  Her last MRI of the brain was in 2016 and was stable compared to the previous ones.  She had had paresthesias and fatigue in the past that contributed to her diagnosis.   We have discussed that she has been stable times many years or therapy that MS is significantly less likely.  REVIEW OF SYSTEMS:  Constitutional: No fevers, chills, sweats, or change in appetite Eyes: No visual changes, double vision, eye pain Ear, nose and throat: No hearing loss, ear pain, nasal congestion, sore throat Cardiovascular: No chest pain, palpitations Respiratory:  No shortness of breath at rest or with exertion.   No wheezes GastrointestinaI: No nausea, vomiting, diarrhea, abdominal pain, fecal incontinence Genitourinary:  No dysuria, urinary retention or frequency.  No nocturia.   Dysfunctional uterine  bleeding Musculoskeletal:  No neck pain, back pain Integumentary: No rash, pruritus, skin lesions Neurological: as above Psychiatric: No depression at this time.  No anxiety Endocrine: No palpitations, diaphoresis, change in appetite, change in weigh or increased thirst Hematologic/Lymphatic:   She has anemia due to dysfunctional uterine bleeding. Allergic/Immunologic: No itchy/runny eyes, nasal congestion, recent allergic reactions, rashes  ALLERGIES: Allergies  Allergen Reactions  . Cefaclor Rash    Other Reaction: neck swelling  . Prochlorperazine Maleate Rash    Other Reaction: jittery;tachycardia  . Sulfamethoxazole-Trimethoprim Nausea Only  . Tessalon Perles [Benzonatate] Rash    HOME MEDICATIONS: Outpatient Prescriptions Prior to Visit  Medication Sig Dispense Refill  . atenolol (TENORMIN) 25 MG tablet TAKE 1 TABLET EVERY DAY 90 tablet 4  . B-D 3CC LUER-LOK SYR 23GX1" 23G X 1" 3 ML MISC Use as needed with 1 1/2 inch needle (also dispense) 18 each 5  . Botulinum Toxin Type A (BOTOX) 200 UNITS SOLR MD to inject every 3 mos. for migraine headaches. 1 each 3  . buPROPion (WELLBUTRIN XL) 150 MG 24 hr tablet Take 150 mg by mouth.    . cholecalciferol (VITAMIN D) 1000 UNITS tablet Take 2,000 Units by mouth daily.    Marland Kitchen ketorolac (TORADOL) 60 MG/2ML SOLN injection Inject 2 mLs (60 mg total) into the muscle once. 12 mL 11  . Multiple Vitamins-Minerals (MULTIVITAMIN WITH MINERALS) tablet Take by mouth.    Marland Kitchen NEEDLE, DISP, 25 G 25G X 1-1/2" MISC     . ondansetron (ZOFRAN-ODT) 8 MG disintegrating tablet Take 1 tablet (8 mg total) by mouth every 8 (eight) hours as needed for nausea or vomiting. 30 tablet 3  . oxyCODONE-acetaminophen (PERCOCET) 10-325 MG tablet Take 1 tablet by mouth every 8 (eight) hours as needed for pain. 1 pill every 4-6 hours as needed for breakthrough pain as needed 60 tablet 0  . rizatriptan (MAXALT-MLT) 10 MG disintegrating tablet TAKE 1 TABLET AS NEEDED FOR MIGRAINE  ( MAY REPEAT IN 2 HOURS IF NEEDED) MAX 3 TABS IN 24 HRS 12 tablet 6  . tiZANidine (ZANAFLEX) 4 MG tablet TAKE 1 TABLET THREE TIMES A DAY 90 tablet 6  . YALE DISP NEEDLES 25GX1-1/2" 25G X 1-1/2" MISC Use for IM injections as needed 18 each 3  . pantoprazole (PROTONIX) 40 MG tablet Take 40 mg by mouth.     No facility-administered medications prior to visit.    PAST MEDICAL HISTORY: Past Medical History  Diagnosis Date  . Headache   . Hypertension   . Vision abnormalities     PAST SURGICAL HISTORY: Past Surgical History  Procedure Laterality Date  . Sinus irrigation      FAMILY HISTORY: Family History  Problem Relation Age of Onset  . Breast cancer Mother   . Melanoma Mother   . Atrial fibrillation Mother   . Supraventricular tachycardia Mother   . Heart attack Father   . Hypertension Father   . Hyperlipidemia Father     SOCIAL HISTORY:  Social History   Social History  .  Marital Status: Single    Spouse Name: N/A  . Number of Children: N/A  . Years of Education: N/A   Occupational History  . Not on file.   Social History Main Topics  . Smoking status: Never Smoker   . Smokeless tobacco: Not on file  . Alcohol Use: No     Comment: Rare  . Drug Use: No  . Sexual Activity: Not on file   Other Topics Concern  . Not on file   Social History Narrative     PHYSICAL EXAM  Filed Vitals:   03/23/16 0849  BP: 126/80  Pulse: 66  Resp: 14  Height: 5\' 6"  (1.676 m)  Weight: 269 lb (122.018 kg)    Body mass index is 43.44 kg/(m^2).   General: The patient is well-developed and well-nourished and in no acute distress  Neck: The neck is mildly tender at occiput  Neurologic Exam  Mental status: The patient is alert and oriented x 3 at the time of the examination.  Speech is normal.  Cranial nerves: Extraocular movements are full.Facial strength is normal.  Trapezius and sternocleidomastoid strength is normal. No dysarthria is noted.   No obvious hearing  deficits are noted.  Gait and station: Station and gait are normal.  Romberg is negative.     DIAGNOSTIC DATA (LABS, IMAGING, TESTING) - I reviewed patient records, labs, notes, testing and imaging myself where available.     ASSESSMENT AND PLAN  Chronic migraine  Abnormal brain MRI  Obstructive sleep apnea  Neck pain   1.    Botox 200 U:   Frontalis (5 U x 4), nasalis/corrugators (5 U x 3), temporalis  (5 U x 9 - 5R, 4L), occipitalis (5 U x 6), splenius capitis (15 U x 2), trapezius (15 U x 2), C6C7 paraspinal muscles (10 U x 2), wasted (10 U) 2.    For breakthrough migraine, continue Maxalt and/or IM Toradol if moderate or severe headache occurs. 3.    Continue CPAP at current pressures.   4.    She will return to see me in 3 months for her next Botox injection, or sooner if she has new or worsening neurologic symptoms.    Jeanmarc Viernes A. Felecia Shelling, MD, PhD [id number], [id number] AM Certified in Neurology, Clinical Neurophysiology, Sleep Medicine, Pain Medicine and Neuroimaging  [facility] [address], [place], [place] [address] [phone]----------------------nn

## 2016-03-31 NOTE — Telephone Encounter (Signed)
[facility] [phone] is calling checking on PA for botox. She will back faxing information also.

## 2016-04-01 NOTE — Telephone Encounter (Signed)
This patient will remain B/B. Called the pharmacy to cancel order.

## 2016-04-04 ENCOUNTER — Other Ambulatory Visit: Payer: Self-pay | Admitting: Neurology

## 2016-06-18 ENCOUNTER — Telehealth: Payer: Self-pay | Admitting: Neurology

## 2016-06-18 ENCOUNTER — Ambulatory Visit (INDEPENDENT_AMBULATORY_CARE_PROVIDER_SITE_OTHER): Payer: BC Managed Care – PPO | Admitting: Neurology

## 2016-06-18 ENCOUNTER — Encounter: Payer: Self-pay | Admitting: Neurology

## 2016-06-18 VITALS — BP 138/92 | Resp 18 | Wt 274.0 lb

## 2016-06-18 DIAGNOSIS — R930 Abnormal findings on diagnostic imaging of skull and head, not elsewhere classified: Secondary | ICD-10-CM | POA: Diagnosis not present

## 2016-06-18 DIAGNOSIS — IMO0002 Reserved for concepts with insufficient information to code with codable children: Secondary | ICD-10-CM

## 2016-06-18 DIAGNOSIS — R5383 Other fatigue: Secondary | ICD-10-CM

## 2016-06-18 DIAGNOSIS — G4733 Obstructive sleep apnea (adult) (pediatric): Secondary | ICD-10-CM

## 2016-06-18 DIAGNOSIS — R9089 Other abnormal findings on diagnostic imaging of central nervous system: Secondary | ICD-10-CM

## 2016-06-18 DIAGNOSIS — G43709 Chronic migraine without aura, not intractable, without status migrainosus: Secondary | ICD-10-CM

## 2016-06-18 DIAGNOSIS — G43019 Migraine without aura, intractable, without status migrainosus: Secondary | ICD-10-CM

## 2016-06-18 DIAGNOSIS — M542 Cervicalgia: Secondary | ICD-10-CM

## 2016-06-18 MED ORDER — RIZATRIPTAN BENZOATE 10 MG PO TBDP: 10.0000 mg | ORAL_TABLET | ORAL | 11 refills | Status: DC | PRN

## 2016-06-18 MED ORDER — OXYCODONE-ACETAMINOPHEN 10-325 MG PO TABS: 1.0000 | ORAL_TABLET | Freq: Three times a day (TID) | ORAL | 0 refills | Status: DC | PRN

## 2016-06-18 NOTE — Progress Notes (Signed)
[facility]  PATIENT: Pamela Murphy DOB: 29-Mar-1971 REASON FOR VISIT: Chronic Migraine headaches   HISTORICAL  CHIEF COMPLAINT:  Chief Complaint  Patient presents with  . Migraines    Here for Botox inj. today/fim    HISTORY OF PRESENT ILLNESS:  Pamela Murphy is a 45 year old woman with a chronic migraine disorder, obstructive sleep apnea and abnormal MRI of the brain.   She had her last Botox injections 3 months ago.  She has done very well with the last cycle having no severe headaches and only a few milder ones over the last month.  Chronic Migraine/neck pain:   She generally does best the first 2 months of each 3 month cycle and she has had other times when the headache return during the third month.   Migraines are more common with flights.   When present, migraines are usually worse on the right than the left.   When present, she has throbbing pain that is associated with photophobia and phonophobia.   She has been on Botox, 200 units in the muscles of the scalp and neck since 2010.   She has had less than one severe migraine a month over the past year while staying on Botox.     She has a more severe migraine she takes Toradol injections or Maxalt pills. She has tolerated Botox very well and has not had any complications.   Migraine history:  She has a long history of chronic migraines that were occurring almost every day (25-28 days/month) for 4 or more hours a day. Multiple medications were tried for prophylactic and abortive therapies with suboptimal response.    In the past, she has failed multiple medications including:  Antiepileptics (Topamax, Keppra, zonisamide), anti-depressants (Cymbalta, Prozac), multiple NSAID's, betas blockers (Inderal, atenolol), muscle relaxants (soma, tizanidine, cyclobenzaprine) in various combinations.   Once the headache occurs, she has tried triptan's with incomplete benefit much of the time. She has also tried opiates.   IM Toradol  has been most effective when one occurs.  Neck pain:   He reports that the neck pain is currently mild. It has done better with Botox, as well. .  When present, pain is axial without radiation to the arms.  MRI on 09/12/2014 showed mild degenerative changes at C5-C6 and C6-C7 with no definite nerve root compression.       OSA:  She has severe obstructive sleep apnea on one PSG (though mild on another)  and uses CPAP nightly when home and on longer trips. She feels better when she uses it nightly.   She deneis much dayitme hypersomnia.   She tolerates it well.  Abnormal MRI/?MS:  She was diagnosed with MS in the past and was on interferon therapy for several years.   I "undiagnosed her" around 2010 and she got a second opinion from Dr. Terie Purser who also concurred.  MRI shows small round foci predominantly in the subcortical and deep white matter in a nonspecific manner.  Her last MRI of the brain was in 2016 and was stable compared to the previous ones.  She had had paresthesias and fatigue in the past that contributed to her diagnosis.   We have discussed that she has been stable times many years or therapy that MS is significantly less likely.   REVIEW OF SYSTEMS:  Constitutional: No fevers, chills, sweats, or change in appetite Eyes: No visual changes, double vision, eye pain Ear, nose and throat: No hearing loss, ear pain, nasal congestion,  sore throat Cardiovascular: No chest pain, palpitations Respiratory:  No shortness of breath at rest or with exertion.   No wheezes GastrointestinaI: No nausea, vomiting, diarrhea, abdominal pain, fecal incontinence Genitourinary:  No dysuria, urinary retention or frequency.  No nocturia.   Dysfunctional uterine bleeding Musculoskeletal:  No neck pain, back pain Integumentary: No rash, pruritus, skin lesions Neurological: as above Psychiatric: No depression at this time.  No anxiety Endocrine: No palpitations, diaphoresis, change in appetite, change in  weigh or increased thirst Hematologic/Lymphatic:   She has anemia due to dysfunctional uterine bleeding. Allergic/Immunologic: No itchy/runny eyes, nasal congestion, recent allergic reactions, rashes  ALLERGIES: Allergies  Allergen Reactions  . Cefaclor Rash    Other Reaction: neck swelling  . Prochlorperazine Maleate Rash    Other Reaction: jittery;tachycardia  . Sulfamethoxazole-Trimethoprim Nausea Only  . Tessalon Perles [Benzonatate] Rash    HOME MEDICATIONS: Outpatient Medications Prior to Visit  Medication Sig Dispense Refill  . atenolol (TENORMIN) 25 MG tablet TAKE 1 TABLET EVERY DAY 90 tablet 3  . B-D 3CC LUER-LOK SYR 23GX1" 23G X 1" 3 ML MISC Use as needed with 1 1/2 inch needle (also dispense) 18 each 5  . Botulinum Toxin Type A (BOTOX) 200 UNITS SOLR MD to inject every 3 mos. for migraine headaches. 1 each 3  . ketorolac (TORADOL) 60 MG/2ML SOLN injection Inject 2 mLs (60 mg total) into the muscle once. 12 mL 11  . Multiple Vitamins-Minerals (MULTIVITAMIN WITH MINERALS) tablet Take by mouth.    Marland Kitchen NEEDLE, DISP, 25 G 25G X 1-1/2" MISC     . ondansetron (ZOFRAN-ODT) 8 MG disintegrating tablet Take 1 tablet (8 mg total) by mouth every 8 (eight) hours as needed for nausea or vomiting. 30 tablet 3  . oxyCODONE-acetaminophen (PERCOCET) 10-325 MG tablet Take 1 tablet by mouth every 8 (eight) hours as needed for pain. 1 pill every 4-6 hours as needed for breakthrough pain as needed 60 tablet 0  . rizatriptan (MAXALT-MLT) 10 MG disintegrating tablet TAKE 1 TABLET AS NEEDED FOR MIGRAINE ( MAY REPEAT IN 2 HOURS IF NEEDED) MAX 3 TABS IN 24 HRS 12 tablet 6  . tiZANidine (ZANAFLEX) 4 MG tablet TAKE 1 TABLET THREE TIMES A DAY 90 tablet 6  . YALE DISP NEEDLES 25GX1-1/2" 25G X 1-1/2" MISC Use for IM injections as needed 18 each 3  . buPROPion (WELLBUTRIN XL) 150 MG 24 hr tablet Take 150 mg by mouth.    . cholecalciferol (VITAMIN D) 1000 UNITS tablet Take 2,000 Units by mouth daily.    .  pantoprazole (PROTONIX) 40 MG tablet Take 40 mg by mouth.     No facility-administered medications prior to visit.     PAST MEDICAL HISTORY: Past Medical History:  Diagnosis Date  . Headache   . Hypertension   . Vision abnormalities     PAST SURGICAL HISTORY: Past Surgical History:  Procedure Laterality Date  . SINUS IRRIGATION      FAMILY HISTORY: Family History  Problem Relation Age of Onset  . Breast cancer Mother   . Melanoma Mother   . Atrial fibrillation Mother   . Supraventricular tachycardia Mother   . Heart attack Father   . Hypertension Father   . Hyperlipidemia Father     SOCIAL HISTORY:  Social History   Social History  . Marital status: Single    Spouse name: N/A  . Number of children: N/A  . Years of education: N/A   Occupational History  . Not on file.  Social History Main Topics  . Smoking status: Never Smoker  . Smokeless tobacco: Not on file  . Alcohol use No     Comment: Rare  . Drug use: No  . Sexual activity: Not on file   Other Topics Concern  . Not on file   Social History Narrative  . No narrative on file     PHYSICAL EXAM  Vitals:   06/18/16 0944  BP: (!) 138/92  Resp: 18  Weight: 274 lb (124.3 kg)    Body mass index is 44.22 kg/m.   General: The patient is well-developed and well-nourished and in no acute distress  Neck: The neck is mildly tender at occiput  Neurologic Exam  Mental status: The patient is alert and oriented x 3 at the time of the examination.  Speech is normal.  Cranial nerves: Extraocular movements are full.Facial strength is normal.  Trapezius and sternocleidomastoid strength is normal. No dysarthria is noted.   No obvious hearing deficits are noted.  Gait and station: Station and gait are normal.  Romberg is negative.     DIAGNOSTIC DATA (LABS, IMAGING, TESTING) - I reviewed patient records, labs, notes, testing and imaging myself where available.     ASSESSMENT AND  PLAN  Chronic migraine  Common migraine with intractable migraine  Obstructive sleep apnea  Abnormal brain MRI  Other fatigue  Neck pain    1.    Botox 200 U:   Frontalis (5 U x 4), nasalis/corrugators (5 U x 3), temporalis  (5 U x 9 - 5R, 4L), occipitalis (5 U x 6), splenius capitis (15 U x 2), trapezius (15 U x 2), C6C7 paraspinal muscles (10 U x 2), wasted (10 U) 2.    For breakthrough migraine, continue Maxalt and/or IM Toradol if moderate or severe headache occurs. 3.    Continue CPAP at current pressures.   4.    She will return to see me in 3 months for her next Botox injection, or sooner if she has new or worsening neurologic symptoms.    Richard A. Felecia Shelling, MD, PhD [id number], [id number] AM Certified in Neurology, Clinical Neurophysiology, Sleep Medicine, Pain Medicine and Neuroimaging  [facility] [address] [place], [place] [address] [phone]

## 2016-07-20 DIAGNOSIS — F32A Depression, unspecified: Secondary | ICD-10-CM | POA: Insufficient documentation

## 2016-07-28 ENCOUNTER — Other Ambulatory Visit: Payer: Self-pay | Admitting: Neurology

## 2016-08-11 ENCOUNTER — Encounter: Payer: Self-pay | Admitting: Neurology

## 2016-08-11 ENCOUNTER — Ambulatory Visit (INDEPENDENT_AMBULATORY_CARE_PROVIDER_SITE_OTHER): Payer: BC Managed Care – PPO | Admitting: Neurology

## 2016-08-11 ENCOUNTER — Telehealth: Payer: Self-pay | Admitting: Neurology

## 2016-08-11 VITALS — BP 132/90 | HR 86 | Resp 6 | Ht 66.0 in | Wt 269.0 lb

## 2016-08-11 DIAGNOSIS — R9089 Other abnormal findings on diagnostic imaging of central nervous system: Secondary | ICD-10-CM

## 2016-08-11 DIAGNOSIS — R930 Abnormal findings on diagnostic imaging of skull and head, not elsewhere classified: Secondary | ICD-10-CM | POA: Diagnosis not present

## 2016-08-11 DIAGNOSIS — G4733 Obstructive sleep apnea (adult) (pediatric): Secondary | ICD-10-CM | POA: Diagnosis not present

## 2016-08-11 DIAGNOSIS — G43019 Migraine without aura, intractable, without status migrainosus: Secondary | ICD-10-CM

## 2016-08-11 DIAGNOSIS — M542 Cervicalgia: Secondary | ICD-10-CM

## 2016-08-11 NOTE — Telephone Encounter (Signed)
Pt called in stating she has had a migraine since yesterday evening. She has taken to maxalts and has tried Tordal as well. She wants to know if she can come in for a nerve block today or something. Please call and advise [phone] she can not come unavoidable meeting.

## 2016-08-11 NOTE — Telephone Encounter (Signed)
I have spoken with Pamela Murphy this morning--he has an 11am appt. available, but she is not able to leave work right now.  RAS will see her after his last pt. this afternoon--about 4:20pm.  She accepted appt./fim

## 2016-08-11 NOTE — Progress Notes (Signed)
[facility]  PATIENT: Pamela Murphy DOB: January 14, 1971 REASON FOR VISIT: Chronic Migraine headaches   HISTORICAL  CHIEF COMPLAINT:  No chief complaint on file.   HISTORY OF PRESENT ILLNESS:  Pamela Murphy is a 45 year old woman with a h/o chronic migraine disorder who has had a severe migraine since yesterday.   She recalls being outside a lot last weekend and having allergy symptoms.   Yesterday, she had the onset of a severe migraine that did not improve with Relpax or Toradol shots (all but 2 HA's in last year have responded).    Pain  is bifrontal, R > L   She also reports neck pain on both sides (occipital) also R > L.   Pain is throbbing but with a constant component.  Pain increases with coughing or sneezing.  She denies visual changes, N/V.         Chronic Migraine/neck pain:   She was doing ok after last Botox until last week.  She has just had a few migraines.      Next Botox is aboout 5-6 weeks  Migraine history:  She has a long history of chronic migraines that were occurring almost every day (25-28 days/month) for 4 or more hours a day. Multiple medications were tried for prophylactic and abortive therapies with suboptimal response.    In the past, she has failed multiple medications including:  Antiepileptics (Topamax, Keppra, zonisamide), anti-depressants (Cymbalta, Prozac), multiple NSAID's, betas blockers (Inderal, atenolol), muscle relaxants (soma, tizanidine, cyclobenzaprine) in various combinations.   Once the headache occurs, she has tried triptan's with incomplete benefit much of the time. She has also tried opiates.   IM Toradol has been most effective when one occurs.  Other:    MRI cervical spine 09/12/2014 showed mild degenerative changes at C5-C6 and C6-C7 with no definite nerve root compression.       MRI of the brain 10/31/2015 showed multiple T2/FLAIR hyperintense foci in a nonspecific pattern. The MRI has been stable over many years.     REVIEW OF  SYSTEMS:  Constitutional: No fevers, chills, sweats, or change in appetite Eyes: No visual changes, double vision, eye pain Ear, nose and throat: No hearing loss, ear pain, nasal congestion, sore throat Cardiovascular: No chest pain, palpitations Respiratory:  No shortness of breath at rest or with exertion.   No wheezes GastrointestinaI: No nausea, vomiting, diarrhea, abdominal pain, fecal incontinence Genitourinary:  No dysuria, urinary retention or frequency.  No nocturia.   Dysfunctional uterine bleeding Musculoskeletal:  No neck pain, back pain Integumentary: No rash, pruritus, skin lesions Neurological: as above Psychiatric: No depression at this time.  No anxiety Endocrine: No palpitations, diaphoresis, change in appetite, change in weigh or increased thirst Hematologic/Lymphatic:   She has anemia due to dysfunctional uterine bleeding. Allergic/Immunologic: No itchy/runny eyes, nasal congestion, recent allergic reactions, rashes  ALLERGIES: Allergies  Allergen Reactions  . Cefaclor Rash    Other Reaction: neck swelling  . Prochlorperazine Maleate Rash    Other Reaction: jittery;tachycardia  . Sulfamethoxazole-Trimethoprim Nausea Only  . Tessalon Perles [Benzonatate] Rash    HOME MEDICATIONS: Outpatient Medications Prior to Visit  Medication Sig Dispense Refill  . atenolol (TENORMIN) 25 MG tablet TAKE 1 TABLET EVERY DAY 90 tablet 3  . B-D 3CC LUER-LOK SYR 23GX1" 23G X 1" 3 ML MISC Use as needed with 1 1/2 inch needle (also dispense) 18 each 5  . Botulinum Toxin Type A (BOTOX) 200 UNITS SOLR MD to inject every 3 mos.  for migraine headaches. 1 each 3  . buPROPion (WELLBUTRIN XL) 150 MG 24 hr tablet Take 150 mg by mouth.    . cholecalciferol (VITAMIN D) 1000 UNITS tablet Take 2,000 Units by mouth daily.    . Multiple Vitamins-Minerals (MULTIVITAMIN WITH MINERALS) tablet Take by mouth.    Marland Kitchen NEEDLE, DISP, 25 G 25G X 1-1/2" MISC     . ondansetron (ZOFRAN-ODT) 8 MG disintegrating  tablet Take 1 tablet (8 mg total) by mouth every 8 (eight) hours as needed for nausea or vomiting. 30 tablet 3  . oxyCODONE-acetaminophen (PERCOCET) 10-325 MG tablet Take 1 tablet by mouth every 8 (eight) hours as needed for pain. 1 pill every 4-6 hours as needed for breakthrough pain as needed 90 tablet 0  . pantoprazole (PROTONIX) 40 MG tablet Take 40 mg by mouth.    . rizatriptan (MAXALT-MLT) 10 MG disintegrating tablet Take 1 tablet (10 mg total) by mouth as needed for migraine. May repeat in 2 hours if needed 12 tablet 11  . tiZANidine (ZANAFLEX) 4 MG tablet TAKE 1 TABLET THREE TIMES A DAY 90 tablet 6  . YALE DISP NEEDLES 25GX1-1/2" 25G X 1-1/2" MISC Use for IM injections as needed 18 each 3   No facility-administered medications prior to visit.     PAST MEDICAL HISTORY: Past Medical History:  Diagnosis Date  . Headache   . Hypertension   . Vision abnormalities     PAST SURGICAL HISTORY: Past Surgical History:  Procedure Laterality Date  . SINUS IRRIGATION      FAMILY HISTORY: Family History  Problem Relation Age of Onset  . Breast cancer Mother   . Melanoma Mother   . Atrial fibrillation Mother   . Supraventricular tachycardia Mother   . Heart attack Father   . Hypertension Father   . Hyperlipidemia Father     SOCIAL HISTORY:  Social History   Social History  . Marital status: Single    Spouse name: N/A  . Number of children: N/A  . Years of education: N/A   Occupational History  . Not on file.   Social History Main Topics  . Smoking status: Never Smoker  . Smokeless tobacco: Not on file  . Alcohol use No     Comment: Rare  . Drug use: No  . Sexual activity: Not on file   Other Topics Concern  . Not on file   Social History Narrative  . No narrative on file     PHYSICAL EXAM  There were no vitals filed for this visit.  There is no height or weight on file to calculate BMI.   General: The patient is well-developed and well-nourished and in  no acute distress  Neck: The neck is tender at occiput, right > left and over lower cervical paraspinal muscles > trapezius muscles.    Neurologic Exam  Mental status: The patient is alert and oriented x 3 at the time of the examination.  Speech is normal.  Cranial nerves: Extraocular movements are full.Facial strength is normal.  Trapezius and sternocleidomastoid strength is normal. No dysarthria is noted.  The tongue is midline, and the patient has symmetric elevation of the soft palate. No obvious hearing deficits are noted.  Gait and station: Station and gait are normal.  Romberg is negative.     DIAGNOSTIC DATA (LABS, IMAGING, TESTING) - I reviewed patient records, labs, notes, testing and imaging myself where available.     ASSESSMENT AND PLAN  Common migraine with intractable migraine  Abnormal brain MRI  Obstructive sleep apnea  Neck pain    1.    Trigger point injection bilateral splenius capitis muscles with 80 mg Depo-Medrol in 5 mL Marcaine. She tolerated the procedure well and there were no complications. Pain improved within a few minutes. 2.    She will return to see me next month as scheduled for Botox injection, or sooner if she has new or worsening neurologic symptoms.    Declynn Lopresti A. Felecia Shelling, MD, PhD [id number], [id number] PM Certified in Neurology, Clinical Neurophysiology, Sleep Medicine, Pain Medicine and Neuroimaging  [facility] [address], [place], [place] [address] [phone]

## 2016-09-10 ENCOUNTER — Telehealth: Payer: Self-pay | Admitting: Neurology

## 2016-09-10 NOTE — Telephone Encounter (Signed)
I have spoken with Adonis Huguenin at [facility] and verified Toradol rx. (for migraines).  [facility] has very strict policies regarding dispensing Toradol, due to possibility of GI bleed.  If they are not able to fill rx., she will give rx. back to Kindsey to take to another pharmacy/fim

## 2016-09-10 NOTE — Telephone Encounter (Signed)
Pamela Murphy/[facility] [phone] called to advise she needs clarification reg ketorolac. She said it is usually designed to use 1 time but the RX has 12 vial with refills. Please call

## 2016-09-17 ENCOUNTER — Telehealth: Payer: Self-pay | Admitting: Neurology

## 2016-09-17 NOTE — Telephone Encounter (Signed)
Noted/fim 

## 2016-09-17 NOTE — Telephone Encounter (Signed)
CONFIRMED WITH PT VIA PH SHE WILL KEEP 11/2 APPT.

## 2016-09-17 NOTE — Telephone Encounter (Signed)
Pt states she does not want to wait for relief from HA pt wants to be seen asap. We R/S from 10/27 to 11/2 in a new pt time slot due to no other appt available. Please advise if she can be seen sooner.

## 2016-09-18 ENCOUNTER — Ambulatory Visit: Payer: BC Managed Care – PPO | Admitting: Neurology

## 2016-09-21 NOTE — Telephone Encounter (Signed)
Looks like I could get her in a day earlier, November 1, in the afternoon.

## 2016-09-22 NOTE — Telephone Encounter (Signed)
Spoke with Pamela Murphy this morning and gave appt. with RAS 09-23-16 at 1530/fim

## 2016-09-23 ENCOUNTER — Telehealth: Payer: Self-pay | Admitting: Neurology

## 2016-09-23 ENCOUNTER — Encounter: Payer: Self-pay | Admitting: Neurology

## 2016-09-23 ENCOUNTER — Ambulatory Visit (INDEPENDENT_AMBULATORY_CARE_PROVIDER_SITE_OTHER): Payer: BC Managed Care – PPO | Admitting: Neurology

## 2016-09-23 VITALS — BP 144/88 | HR 78 | Resp 18 | Ht 66.0 in | Wt 269.0 lb

## 2016-09-23 DIAGNOSIS — G43709 Chronic migraine without aura, not intractable, without status migrainosus: Secondary | ICD-10-CM | POA: Diagnosis not present

## 2016-09-23 DIAGNOSIS — M542 Cervicalgia: Secondary | ICD-10-CM | POA: Diagnosis not present

## 2016-09-23 DIAGNOSIS — IMO0002 Reserved for concepts with insufficient information to code with codable children: Secondary | ICD-10-CM

## 2016-09-23 DIAGNOSIS — G4733 Obstructive sleep apnea (adult) (pediatric): Secondary | ICD-10-CM

## 2016-09-23 MED ORDER — KETOROLAC TROMETHAMINE 60 MG/2ML IM SOLN: INTRAMUSCULAR | 5 refills | Status: DC

## 2016-09-23 NOTE — Progress Notes (Signed)
[facility]  PATIENT: Pamela Murphy DOB: 12-21-1970 REASON FOR VISIT: Chronic Migraine headaches   HISTORICAL  CHIEF COMPLAINT:  Chief Complaint  Patient presents with  . OTHER    Here for Botox inj. to treat migraines.  Sts. [facility] local [facility] will no longer fill Toradol IM due to higher risk of gi bleed with Toradol/fim    HISTORY OF PRESENT ILLNESS:  Pamela Murphy is a 45 year old woman with a chronic migraine disorder, obstructive sleep apnea and abnormal MRI of the brain.     Chronic Migraine/neck pain:   She had her last Botox injections 3 months ago.  She did well for the most part but did have a severe migraine last month requiring a TPI to break.  Flying often triggers a migraine.   When present, migraines are usually worse on the right than the left.   She reports  throbbing pain , photophobia and phonophobia.   She has been on Botox, 200 units in the muscles of the scalp and neck since 2010.   She usually gets 1-3 severe migraines per cycle and a few more headaches that are mild.   When she has a more severe migraine she takes Toradol injections or Maxalt pills. She has tolerated Botox very well and has not had any complications.   Migraine history:  She has a long history of chronic migraines that were occurring almost every day (25-28 days/month) for 4 or more hours a day. Multiple medications were tried for prophylactic and abortive therapies with suboptimal response.    In the past, she has failed multiple medications including:  Antiepileptics (Topamax, Keppra, zonisamide), anti-depressants (Cymbalta, Prozac), multiple NSAID's, betas blockers (Inderal, atenolol), muscle relaxants (soma, tizanidine, cyclobenzaprine) in various combinations.   Once the headache occurs, she has tried triptan's with incomplete benefit much of the time. She has also tried opiates.   IM Toradol has been most effective when one occurs.  Neck pain:   She was having more  occipital pain last month and came in for a trigger point injection with benefit.   Neck pain has done better with Botox, as well. .  When present, pain is axial without radiation to the arms.  MRI on 09/12/2014 showed mild degenerative changes at C5-C6 and C6-C7 with no definite nerve root compression.       OSA:  She has severe obstructive sleep apnea on one PSG (though mild on another)  and uses CPAP nightly when home and on longer trips. She feels better when she uses it nightly.   She deneis much dayitme hypersomnia.   She tolerates it well.  Abnormal MRI/?MS:  She was diagnosed with MS in the past and was on interferon therapy for several years.   I "undiagnosed her" around 2010 and she got a second opinion from Dr. Terie Purser who also concurred.  MRI shows small round foci predominantly in the subcortical and deep white matter in a nonspecific manner.  Her last MRI of the brain was in 2016 and was stable compared to the previous ones.  She had had paresthesias and fatigue in the past that contributed to her diagnosis.   We have discussed that she has been stable times many years or therapy that MS is significantly less likely.   REVIEW OF SYSTEMS:  Constitutional: No fevers, chills, sweats, or change in appetite Eyes: No visual changes, double vision, eye pain Ear, nose and throat: No hearing loss, ear pain, nasal congestion, sore throat Cardiovascular:  No chest pain, palpitations Respiratory:  No shortness of breath at rest or with exertion.   No wheezes GastrointestinaI: No nausea, vomiting, diarrhea, abdominal pain, fecal incontinence Genitourinary:  No dysuria, urinary retention or frequency.  No nocturia.   Dysfunctional uterine bleeding Musculoskeletal:  No neck pain, back pain Integumentary: No rash, pruritus, skin lesions Neurological: as above Psychiatric: No depression at this time.  No anxiety Endocrine: No palpitations, diaphoresis, change in appetite, change in weigh or increased  thirst Hematologic/Lymphatic:   She has anemia due to dysfunctional uterine bleeding. Allergic/Immunologic: No itchy/runny eyes, nasal congestion, recent allergic reactions, rashes  ALLERGIES: Allergies  Allergen Reactions  . Cefaclor Rash    Other Reaction: neck swelling  . Prochlorperazine Maleate Rash    Other Reaction: jittery;tachycardia  . Sulfamethoxazole-Trimethoprim Nausea Only  . Tessalon Perles [Benzonatate] Rash    HOME MEDICATIONS: Outpatient Medications Prior to Visit  Medication Sig Dispense Refill  . atenolol (TENORMIN) 25 MG tablet TAKE 1 TABLET EVERY DAY 90 tablet 3  . B-D 3CC LUER-LOK SYR 23GX1" 23G X 1" 3 ML MISC Use as needed with 1 1/2 inch needle (also dispense) 18 each 5  . Botulinum Toxin Type A (BOTOX) 200 UNITS SOLR MD to inject every 3 mos. for migraine headaches. 1 each 3  . buPROPion (WELLBUTRIN XL) 150 MG 24 hr tablet Take 150 mg by mouth.    . cholecalciferol (VITAMIN D) 1000 UNITS tablet Take 2,000 Units by mouth daily.    . Multiple Vitamins-Minerals (MULTIVITAMIN WITH MINERALS) tablet Take by mouth.    Marland Kitchen NEEDLE, DISP, 25 G 25G X 1-1/2" MISC     . ondansetron (ZOFRAN-ODT) 8 MG disintegrating tablet Take 1 tablet (8 mg total) by mouth every 8 (eight) hours as needed for nausea or vomiting. 30 tablet 3  . oxyCODONE-acetaminophen (PERCOCET) 10-325 MG tablet Take 1 tablet by mouth every 8 (eight) hours as needed for pain. 1 pill every 4-6 hours as needed for breakthrough pain as needed 90 tablet 0  . pantoprazole (PROTONIX) 40 MG tablet Take 40 mg by mouth.    . rizatriptan (MAXALT-MLT) 10 MG disintegrating tablet Take 1 tablet (10 mg total) by mouth as needed for migraine. May repeat in 2 hours if needed 12 tablet 11  . tiZANidine (ZANAFLEX) 4 MG tablet TAKE 1 TABLET THREE TIMES A DAY 90 tablet 6  . YALE DISP NEEDLES 25GX1-1/2" 25G X 1-1/2" MISC Use for IM injections as needed 18 each 3   No facility-administered medications prior to visit.     PAST  MEDICAL HISTORY: Past Medical History:  Diagnosis Date  . Headache   . Hypertension   . Vision abnormalities     PAST SURGICAL HISTORY: Past Surgical History:  Procedure Laterality Date  . SINUS IRRIGATION      FAMILY HISTORY: Family History  Problem Relation Age of Onset  . Breast cancer Mother   . Melanoma Mother   . Atrial fibrillation Mother   . Supraventricular tachycardia Mother   . Heart attack Father   . Hypertension Father   . Hyperlipidemia Father     SOCIAL HISTORY:  Social History   Social History  . Marital status: Single    Spouse name: N/A  . Number of children: N/A  . Years of education: N/A   Occupational History  . Not on file.   Social History Main Topics  . Smoking status: Never Smoker  . Smokeless tobacco: Not on file  . Alcohol use No  Comment: Rare  . Drug use: No  . Sexual activity: Not on file   Other Topics Concern  . Not on file   Social History Narrative  . No narrative on file     PHYSICAL EXAM  Vitals:   09/23/16 1539  BP: (!) 144/88  Pulse: 78  Resp: 18  Weight: 269 lb (122 kg)  Height: 5\' 6"  (1.676 m)    Body mass index is 43.42 kg/m.   General: The patient is well-developed and well-nourished and in no acute distress  Neck: The neck is mildly tender at the occiput with good ROM  Neurologic Exam  Mental status: The patient is alert and oriented x 3 at the time of the examination.  Speech is normal.  Cranial nerves: Extraocular movements are full.Facial strength is normal.  Trapezius and sternocleidomastoid strength is normal. No dysarthria is noted.   No obvious hearing deficits are noted.  Gait and station: Station and gait are normal.  Romberg is negative.     DIAGNOSTIC DATA (LABS, IMAGING, TESTING) - I reviewed patient records, labs, notes, testing and imaging myself where available.     ASSESSMENT AND PLAN  Chronic migraine  Obstructive sleep apnea  Neck pain   1.    Botox 200 U:    Frontalis (5 U x 4), nasalis/corrugators (5 U x 3), temporalis  (5 U x 9 - 5R, 4L), occipitalis (5 U x 6), splenius capitis (15 U x 2), trapezius (15 U x 2), C6C7 paraspinal muscles (10 U x 2), wasted (10 U) 2.     Maxalt and/or IM Toradol if moderate or severe headache occurs.   I will send in the Toradol prescription written a different way for the pharmacy. 3.    Continue CPAP at current pressures.   4.    She will return to see me in 3 months for her next Botox injection, or sooner if she has new or worsening neurologic symptoms.    Kylen Ismael A. Felecia Shelling, MD, PhD [id number], [id number] PM Certified in Neurology, Clinical Neurophysiology, Sleep Medicine, Pain Medicine and Neuroimaging  [facility] [address], [place], [place] [address] [phone]

## 2016-09-23 NOTE — Telephone Encounter (Signed)
PLEASE SCHEDULE HER NEXT BOTOX IN 90 DAYS AND CALL PT. SHE HAD BOTOX TODAY 09/23/16. CB

## 2016-09-24 ENCOUNTER — Ambulatory Visit: Payer: BC Managed Care – PPO | Admitting: Neurology

## 2016-09-25 NOTE — Telephone Encounter (Signed)
Called to schedule patients next injection. She did not answer so I left a VM asking her to call back.

## 2016-10-26 DIAGNOSIS — M545 Low back pain, unspecified: Secondary | ICD-10-CM | POA: Insufficient documentation

## 2016-10-30 ENCOUNTER — Other Ambulatory Visit: Payer: Self-pay | Admitting: Neurology

## 2016-12-25 ENCOUNTER — Encounter: Payer: Self-pay | Admitting: Neurology

## 2016-12-25 ENCOUNTER — Telehealth: Payer: Self-pay | Admitting: Neurology

## 2016-12-25 ENCOUNTER — Ambulatory Visit (INDEPENDENT_AMBULATORY_CARE_PROVIDER_SITE_OTHER): Payer: BC Managed Care – PPO | Admitting: Neurology

## 2016-12-25 VITALS — BP 144/93 | HR 81 | Resp 16 | Ht 66.0 in | Wt 243.0 lb

## 2016-12-25 DIAGNOSIS — G4733 Obstructive sleep apnea (adult) (pediatric): Secondary | ICD-10-CM | POA: Diagnosis not present

## 2016-12-25 DIAGNOSIS — R9089 Other abnormal findings on diagnostic imaging of central nervous system: Secondary | ICD-10-CM

## 2016-12-25 DIAGNOSIS — IMO0002 Reserved for concepts with insufficient information to code with codable children: Secondary | ICD-10-CM

## 2016-12-25 DIAGNOSIS — G43709 Chronic migraine without aura, not intractable, without status migrainosus: Secondary | ICD-10-CM | POA: Diagnosis not present

## 2016-12-25 MED ORDER — OXYCODONE-ACETAMINOPHEN 10-325 MG PO TABS: 1.0000 | ORAL_TABLET | Freq: Three times a day (TID) | ORAL | 0 refills | Status: DC | PRN

## 2016-12-25 NOTE — Progress Notes (Signed)
[facility]  PATIENT: Pamela Murphy DOB: December 23, 1970 REASON FOR VISIT: Chronic Migraine headaches   HISTORICAL  CHIEF COMPLAINT:  Chief Complaint  Patient presents with  . Migraines    Botox/fim    HISTORY OF PRESENT ILLNESS:  Pamela Murphy is a 46 year old woman with a chronic migraine disorder, obstructive sleep apnea and abnormal MRI of the brain.     Chronic Migraine/neck pain:   She has a severe migraine today.  Her last Botox injection was about 3 months ago (09/23/2016).  She started Botox in 2010. Flying often triggers a migraine.   When she has a migraine, pain is usually worse on the right than the left.   She reports  throbbing pain , photophobia and phonophobia.   She has tolerated Botox well. She usually gets 1-3 severe migraines per cycle and a few more headaches that are mild.   When she has a more severe migraine she takes Toradol injections or Maxalt pills. She has tolerated Botox very well and has not had any complications.   She is more likely to get as severe migraine during the last couple weeks of each cycle.  Migraine history:  She has a long history of chronic migraines that were occurring almost every day (25-28 days/month) for 4 or more hours a day. Multiple medications were tried for prophylactic and abortive therapies with suboptimal response.    In the past, she has failed multiple medications including:  Antiepileptics (Topamax, Keppra, zonisamide), anti-depressants (Cymbalta, Prozac), multiple NSAID's, betas blockers (Inderal, atenolol), muscle relaxants (soma, tizanidine, cyclobenzaprine) in various combinations.   Once the headache occurs, she has tried triptan's with incomplete benefit much of the time. She has also tried opiates.   IM Toradol has been most effective when one occurs.  Neck pain:   She has had some neck pain off and on and it usually is worse when she gets a migraine, as she has today. The occipital/neck tenderness usually  improves with the Botox injections.   When present, pain is axial without radiation to the arms.  MRI on 09/12/2014 showed mild degenerative changes at C5-C6 and C6-C7 with no definite nerve root compression.       OSA:  She continues to use her CPAP on a nightly basis. With her on longer trips. She feels less fatigue when she uses her CPAP the next day.  She deneis much dayitme hypersomnia.   She tolerates it well.  Other:   She had TMJ arthroscopy at [facility] in 2010 and is being referred back for possible pain management/revision as this is been bothering her more lately.  Abnormal MRI/?MS:  She was diagnosed with MS in the past and was on interferon therapy for several years.   I "undiagnosed her" around 2010 and she got a second opinion from Dr. Terie Purser who also concurred.  MRI shows small round foci predominantly in the subcortical and deep white matter in a nonspecific manner.  Her last MRI of the brain was in 2016 and was stable compared to the previous ones.  She had had paresthesias and fatigue in the past that contributed to her diagnosis.   We have discussed that she has been stable times many years or therapy that MS is significantly less likely.   REVIEW OF SYSTEMS:  Constitutional: No fevers, chills, sweats, or change in appetite Eyes: No visual changes, double vision, eye pain Ear, nose and throat: No hearing loss, ear pain, nasal congestion, sore throat Cardiovascular: No chest  pain, palpitations Respiratory:  No shortness of breath at rest or with exertion.   No wheezes GastrointestinaI: No nausea, vomiting, diarrhea, abdominal pain, fecal incontinence Genitourinary:  No dysuria, urinary retention or frequency.  No nocturia.   Dysfunctional uterine bleeding Musculoskeletal:  No neck pain, back pain Integumentary: No rash, pruritus, skin lesions Neurological: as above Psychiatric: No depression at this time.  No anxiety Endocrine: No palpitations, diaphoresis, change in appetite,  change in weigh or increased thirst Hematologic/Lymphatic:   She has anemia due to dysfunctional uterine bleeding. Allergic/Immunologic: No itchy/runny eyes, nasal congestion, recent allergic reactions, rashes  ALLERGIES: Allergies  Allergen Reactions  . Cefaclor Rash    Other Reaction: neck swelling  . Prochlorperazine Maleate Rash    Other Reaction: jittery;tachycardia  . Sulfamethoxazole-Trimethoprim Nausea Only  . Tessalon Perles [Benzonatate] Rash    HOME MEDICATIONS: Outpatient Medications Prior to Visit  Medication Sig Dispense Refill  . atenolol (TENORMIN) 25 MG tablet TAKE 1 TABLET EVERY DAY 90 tablet 3  . B-D 3CC LUER-LOK SYR 23GX1" 23G X 1" 3 ML MISC Use as needed with 1 1/2 inch needle (also dispense) 18 each 5  . Botulinum Toxin Type A (BOTOX) 200 UNITS SOLR MD to inject every 3 mos. for migraine headaches. 1 each 3  . cholecalciferol (VITAMIN D) 1000 UNITS tablet Take 2,000 Units by mouth daily.    Marland Kitchen ketorolac (TORADOL) 60 MG/2ML SOLN injection Inject up to 2 mL (60 mg) prn headache.  No more than 2 mL/day or 8 mL/month 8 mL 5  . Multiple Vitamins-Minerals (MULTIVITAMIN WITH MINERALS) tablet Take by mouth.    Marland Kitchen NEEDLE, DISP, 25 G 25G X 1-1/2" MISC     . ondansetron (ZOFRAN-ODT) 8 MG disintegrating tablet Take 1 tablet (8 mg total) by mouth every 8 (eight) hours as needed for nausea or vomiting. 30 tablet 3  . rizatriptan (MAXALT-MLT) 10 MG disintegrating tablet Take 1 tablet (10 mg total) by mouth as needed for migraine. May repeat in 2 hours if needed 12 tablet 11  . tiZANidine (ZANAFLEX) 4 MG tablet TAKE 1 TABLET THREE TIMES A DAY 90 tablet 5  . YALE DISP NEEDLES 25GX1-1/2" 25G X 1-1/2" MISC Use for IM injections as needed 18 each 3  . oxyCODONE-acetaminophen (PERCOCET) 10-325 MG tablet Take 1 tablet by mouth every 8 (eight) hours as needed for pain. 1 pill every 4-6 hours as needed for breakthrough pain as needed 90 tablet 0  . pantoprazole (PROTONIX) 40 MG tablet Take  40 mg by mouth.    Marland Kitchen buPROPion (WELLBUTRIN XL) 150 MG 24 hr tablet Take 150 mg by mouth.     No facility-administered medications prior to visit.     PAST MEDICAL HISTORY: Past Medical History:  Diagnosis Date  . Headache   . Hypertension   . Vision abnormalities     PAST SURGICAL HISTORY: Past Surgical History:  Procedure Laterality Date  . SINUS IRRIGATION      FAMILY HISTORY: Family History  Problem Relation Age of Onset  . Breast cancer Mother   . Melanoma Mother   . Atrial fibrillation Mother   . Supraventricular tachycardia Mother   . Heart attack Father   . Hypertension Father   . Hyperlipidemia Father     SOCIAL HISTORY:  Social History   Social History  . Marital status: Single    Spouse name: N/A  . Number of children: N/A  . Years of education: N/A   Occupational History  . Not on  file.   Social History Main Topics  . Smoking status: Never Smoker  . Smokeless tobacco: Never Used  . Alcohol use No     Comment: Rare  . Drug use: No  . Sexual activity: Not on file   Other Topics Concern  . Not on file   Social History Narrative  . No narrative on file     PHYSICAL EXAM  Vitals:   12/25/16 1215  BP: (!) 144/93  Pulse: 81  Resp: 16  Weight: 243 lb (110.2 kg)  Height: 5\' 6"  (1.676 m)    Body mass index is 39.22 kg/m.   General: The patient is well-developed and well-nourished and in no acute distress  Neck: Her neck is mildly tender at the occiput. Range of motion is good.   Neurologic Exam  Mental status: The patient is alert and oriented x 3 at the time of the examination.  Speech is normal.  Cranial nerves: Extraocular movements are full.  Facial strength is normal. Trapezius and sternocleidomastoid strength is normal.. No dysarthria is noted.   No obvious hearing deficits are noted.  Gait and station: Station and gait are normal.  Romberg is negative. Her tandem gait is normal.    DIAGNOSTIC DATA (LABS, IMAGING,  TESTING) - I reviewed patient records, labs, notes, testing and imaging myself where available.     ASSESSMENT AND PLAN  Chronic migraine  Abnormal brain MRI  Obstructive sleep apnea   1.    Inject Botox 200U :   Frontalis (5 U x 4), nasalis/corrugators (5 U x 3), temporalis  (5 U x 9 - 5R, 4L), occipitalis (5 U x 6), splenius capitis (15 U x 2), trapezius (15 U x 2), C6C7 paraspinal muscles (10 U x 2), wasted (10 U) 2.    Toradol 60 mg IM today 3.    She will continue Maxalt and/or IM Toradol if moderate or severe headache occurs.     3.    For OSA, continue CPAP at current pressures.   4.    She will return to see me in 3 months for her next Botox injection, or sooner if she has new or worsening neurologic symptoms.    Tonilynn Bieker A. Felecia Shelling, MD, PhD [id number], [id number] PM Certified in Neurology, Clinical Neurophysiology, Sleep Medicine, Pain Medicine and Neuroimaging  [facility] [address] [place], [place] [address] [phone]

## 2016-12-25 NOTE — Telephone Encounter (Signed)
PT NEEDS BOTOX APPT

## 2017-03-09 DIAGNOSIS — M7552 Bursitis of left shoulder: Secondary | ICD-10-CM | POA: Insufficient documentation

## 2017-03-09 DIAGNOSIS — M5030 Other cervical disc degeneration, unspecified cervical region: Secondary | ICD-10-CM | POA: Insufficient documentation

## 2017-03-29 ENCOUNTER — Telehealth: Payer: Self-pay | Admitting: *Deleted

## 2017-03-29 NOTE — Telephone Encounter (Signed)
I spoke with pt. to r/s her 5/10 appt. with RAS.  She reports that she is currently being treated for uti, has a fever of 101.  appt. r/s for 04/13/17/fim

## 2017-04-01 ENCOUNTER — Ambulatory Visit: Payer: BC Managed Care – PPO | Admitting: Neurology

## 2017-04-01 DIAGNOSIS — I10 Essential (primary) hypertension: Secondary | ICD-10-CM | POA: Insufficient documentation

## 2017-04-01 DIAGNOSIS — F419 Anxiety disorder, unspecified: Secondary | ICD-10-CM | POA: Insufficient documentation

## 2017-04-08 ENCOUNTER — Other Ambulatory Visit: Payer: Self-pay | Admitting: Neurology

## 2017-04-13 ENCOUNTER — Encounter: Payer: Self-pay | Admitting: Neurology

## 2017-04-13 ENCOUNTER — Ambulatory Visit (INDEPENDENT_AMBULATORY_CARE_PROVIDER_SITE_OTHER): Payer: BC Managed Care – PPO | Admitting: Neurology

## 2017-04-13 VITALS — BP 148/88 | HR 94 | Resp 20 | Ht 66.0 in | Wt 293.5 lb

## 2017-04-13 DIAGNOSIS — M545 Low back pain, unspecified: Secondary | ICD-10-CM

## 2017-04-13 DIAGNOSIS — M47816 Spondylosis without myelopathy or radiculopathy, lumbar region: Secondary | ICD-10-CM | POA: Insufficient documentation

## 2017-04-13 DIAGNOSIS — M47896 Other spondylosis, lumbar region: Secondary | ICD-10-CM

## 2017-04-13 DIAGNOSIS — G4733 Obstructive sleep apnea (adult) (pediatric): Secondary | ICD-10-CM

## 2017-04-13 DIAGNOSIS — G43709 Chronic migraine without aura, not intractable, without status migrainosus: Secondary | ICD-10-CM | POA: Diagnosis not present

## 2017-04-13 DIAGNOSIS — G43019 Migraine without aura, intractable, without status migrainosus: Secondary | ICD-10-CM

## 2017-04-13 DIAGNOSIS — M542 Cervicalgia: Secondary | ICD-10-CM

## 2017-04-13 DIAGNOSIS — R9089 Other abnormal findings on diagnostic imaging of central nervous system: Secondary | ICD-10-CM

## 2017-04-13 DIAGNOSIS — IMO0002 Reserved for concepts with insufficient information to code with codable children: Secondary | ICD-10-CM

## 2017-04-13 MED ORDER — MELOXICAM 15 MG PO TABS: 15.0000 mg | ORAL_TABLET | Freq: Every day | ORAL | 5 refills | Status: DC

## 2017-04-13 NOTE — Progress Notes (Signed)
[facility]  PATIENT: Pamela Murphy DOB: 28-Aug-1971 REASON FOR VISIT: Chronic Migraine headaches   HISTORICAL  CHIEF COMPLAINT:  Chief Complaint  Patient presents with  . OTHER    Here today for Botox/fim    HISTORY OF PRESENT ILLNESS:  Pamela Murphy is a 46 year old woman with a chronic migraine disorder, obstructive sleep apnea and abnormal MRI of the brain.     Low back pain: She has had more back pain the last few months. Two weeks ago, she has shortness of breath and back pain. She went to [facility] emergency room and was admitted overnight. She had several studies I personally reviewed the lumbar spine on the CT scan of the abdomen and pelvis.   It showed severe L5S1 facet hypertrophy and moderate facet hypertrophy at L4L5 with some canal narrowing at that level.      Chronic Migraine/neck pain:    Her last Botox injection was over 3 months ago..  She started Botox in 2010.     When she has a migraine, pain is usually worse on the right than the left. Headaches can be triggered by flying. When present, pain is throbbing and is associated with photophobia and phonophobia. With Botox, she has fewer headaches, especially the first 2 months of each cycle.      When she has a more severe migraine she takes Toradol injections or Maxalt pills. She has tolerated Botox very well and has not had any complications.   She is more likely to get as severe migraine during the last couple weeks of each cycle.  Migraine history:  She has a long history of chronic migraines that were occurring almost every day (25-28 days/month) for 4 or more hours a day. Multiple medications were tried for prophylactic and abortive therapies with suboptimal response.    In the past, she has failed multiple medications including:  Antiepileptics (Topamax, Keppra, zonisamide), anti-depressants (Cymbalta, Prozac), multiple NSAID's, betas blockers (Inderal, atenolol), muscle relaxants (soma,  tizanidine, cyclobenzaprine) in various combinations.   Once the headache occurs, she has tried triptan's with incomplete benefit much of the time. She has also tried opiates.   IM Toradol has been most effective when one occurs.  Neck pain:  She continues to express some neck pain and this is usually worse with headaches are worse. Botox has helped the neck pain as well.  She has no pain radiating into the arms. MRI on 09/12/2014 showed mild degenerative changes at C5-C6 and C6-C7 with no definite nerve root compression.       OSA:  She continues to use her CPAP on a nightly basis. With her on longer trips. She feels less fatigue when she uses her CPAP the next day.  She deneis much dayitme hypersomnia.   She tolerates it well.    Abnormal MRI/?MS:  She was diagnosed with MS in the past and was on interferon therapy for several years.   I "undiagnosed her" around 2010 and she got a second opinion from Dr. Terie Purser who also concurred.  MRI shows small round foci predominantly in the subcortical and deep white matter in a nonspecific manner.  Her last MRI of the brain was in 2016 and was stable compared to the previous ones.  She had had paresthesias and fatigue in the past that contributed to her diagnosis.   We have discussed that she has been stable times many years or therapy that MS is significantly less likely.   REVIEW OF  SYSTEMS:  Constitutional: No fevers, chills, sweats, or change in appetite Eyes: No visual changes, double vision, eye pain Ear, nose and throat: No hearing loss, ear pain, nasal congestion, sore throat Cardiovascular: No chest pain, palpitations Respiratory:  see above GastrointestinaI: No nausea, vomiting, diarrhea, abdominal pain, fecal incontinence Genitourinary:  No dysuria, urinary retention or frequency.  No nocturia.   Dysfunctional uterine bleeding Musculoskeletal:  notes low back pain that has been worse the last couple months and neck pain that is helped by  Botox Integumentary: No rash, pruritus, skin lesions Neurological: as above Psychiatric: No depression at this time.  No anxiety Endocrine: No palpitations, diaphoresis, change in appetite, change in weigh or increased thirst Hematologic/Lymphatic:   She has anemia due to dysfunctional uterine bleeding. Allergic/Immunologic: No itchy/runny eyes, nasal congestion, recent allergic reactions, rashes  ALLERGIES: Allergies  Allergen Reactions  . Cefaclor Rash    Other Reaction: neck swelling  . Prochlorperazine Maleate Rash    Other Reaction: jittery;tachycardia  . Sulfamethoxazole-Trimethoprim Nausea Only  . Tessalon Perles [Benzonatate] Rash    HOME MEDICATIONS: Outpatient Medications Prior to Visit  Medication Sig Dispense Refill  . atenolol (TENORMIN) 25 MG tablet TAKE 1 TABLET EVERY DAY 90 tablet 3  . B-D 3CC LUER-LOK SYR 23GX1" 23G X 1" 3 ML MISC Use as needed with 1 1/2 inch needle (also dispense) 18 each 5  . Botulinum Toxin Type A (BOTOX) 200 UNITS SOLR MD to inject every 3 mos. for migraine headaches. 1 each 3  . cholecalciferol (VITAMIN D) 1000 UNITS tablet Take 2,000 Units by mouth daily.    Marland Kitchen ketorolac (TORADOL) 60 MG/2ML SOLN injection Inject up to 2 mL (60 mg) prn headache.  No more than 2 mL/day or 8 mL/month 8 mL 5  . Multiple Vitamins-Minerals (MULTIVITAMIN WITH MINERALS) tablet Take by mouth.    Marland Kitchen NEEDLE, DISP, 25 G 25G X 1-1/2" MISC     . ondansetron (ZOFRAN-ODT) 8 MG disintegrating tablet Take 1 tablet (8 mg total) by mouth every 8 (eight) hours as needed for nausea or vomiting. 30 tablet 3  . oxyCODONE-acetaminophen (PERCOCET) 10-325 MG tablet Take 1 tablet by mouth every 8 (eight) hours as needed for pain. 1 pill every 4-6 hours as needed for breakthrough pain as needed 90 tablet 0  . pantoprazole (PROTONIX) 40 MG tablet Take 40 mg by mouth.    . rizatriptan (MAXALT-MLT) 10 MG disintegrating tablet Take 1 tablet (10 mg total) by mouth as needed for migraine. May  repeat in 2 hours if needed 12 tablet 11  . tiZANidine (ZANAFLEX) 4 MG tablet TAKE 1 TABLET THREE TIMES A DAY 90 tablet 5  . YALE DISP NEEDLES 25GX1-1/2" 25G X 1-1/2" MISC Use for IM injections as needed 18 each 3   No facility-administered medications prior to visit.     PAST MEDICAL HISTORY: Past Medical History:  Diagnosis Date  . Headache   . Hypertension   . Vision abnormalities     PAST SURGICAL HISTORY: Past Surgical History:  Procedure Laterality Date  . SINUS IRRIGATION      FAMILY HISTORY: Family History  Problem Relation Age of Onset  . Breast cancer Mother   . Melanoma Mother   . Atrial fibrillation Mother   . Supraventricular tachycardia Mother   . Heart attack Father   . Hypertension Father   . Hyperlipidemia Father     SOCIAL HISTORY:  Social History   Social History  . Marital status: Single    Spouse  name: N/A  . Number of children: N/A  . Years of education: N/A   Occupational History  . Not on file.   Social History Main Topics  . Smoking status: Never Smoker  . Smokeless tobacco: Never Used  . Alcohol use No     Comment: Rare  . Drug use: No  . Sexual activity: Not on file   Other Topics Concern  . Not on file   Social History Narrative  . No narrative on file     PHYSICAL EXAM  Vitals:   04/13/17 1035  BP: (!) 148/88  Pulse: 94  Resp: 20  Weight: 293 lb 8 oz (133.1 kg)  Height: 5\' 6"  (1.676 m)    Body mass index is 47.37 kg/m.   General: The patient is well-developed and well-nourished and in no acute distress  Neck: Her neck is mildly tender at the occiput. Range of motion is good.   Musculoskeletal: The back has a good range of motion is nontender.  Neurologic Exam  Mental status: The patient is alert and oriented x 3 at the time of the examination.  Speech is normal.  Cranial nerves: Extraocular movements are full.  Facial strength is normal. Trapezius and sternocleidomastoid strength is normal.. No  dysarthria is noted.   No obvious hearing deficits are noted.  Motor:   Muscle tone and bulk is normal. Strength is normal in the arms and legs.  Gait and station: Station and gait are normal.  Romberg is negative. Her tandem gait is normal.  DTR's:  Deep tendon reflexes are normal and symmetric.    DIAGNOSTIC DATA (LABS, IMAGING, TESTING) - I reviewed patient records, labs, notes, testing and imaging myself where available.     ASSESSMENT AND PLAN  Midline low back pain without sciatica, unspecified chronicity  Facet hypertrophy of lumbar region  Abnormal brain MRI  Common migraine with intractable migraine  Obstructive apnea  Neck pain   1.    Inject Botox 200U :   Frontalis (5 U x 4), nasalis/corrugators (5 U x 3), temporalis  (5 U x 9 - 5R, 4L), occipitalis (5 U x 6), splenius capitis (15 U x 2), trapezius (15 U x 2), C6C7 paraspinal muscles (10 U x 2), wasted (10 U) 2.    Meloxicam for LBP.     We discussed that if symptoms worsen consider MRI of the lumbar spine to better characterize and also consider medial branch blocks/radiofrequency ablations of the medial branch nerves 3.    She will continue Maxalt and/or IM Toradol if moderate or severe headache occurs.     4.    Continue CPAP at current pressures for OSA..   5.    She will return to see me in 3 months for her next Botox injection, or sooner if she has new or worsening neurologic symptoms.    Bryer Cozzolino A. Felecia Shelling, MD, PhD 05/20/3661, 9:47 PM Certified in Neurology, Clinical Neurophysiology, Sleep Medicine, Pain Medicine and Neuroimaging  [facility] [address] [place], [place] [address] [phone]

## 2017-04-16 ENCOUNTER — Other Ambulatory Visit: Payer: Self-pay | Admitting: Neurology

## 2017-05-05 DIAGNOSIS — M26629 Arthralgia of temporomandibular joint, unspecified side: Secondary | ICD-10-CM | POA: Insufficient documentation

## 2017-05-24 ENCOUNTER — Ambulatory Visit: Admission: RE | Admit: 2017-05-24 | Discharge: 2017-05-24

## 2017-05-24 DIAGNOSIS — R8290 Unspecified abnormal findings in urine: Principal | ICD-10-CM

## 2017-05-24 DIAGNOSIS — R509 Fever, unspecified: Secondary | ICD-10-CM

## 2017-05-25 ENCOUNTER — Telehealth: Payer: Self-pay | Admitting: Neurology

## 2017-05-25 NOTE — Telephone Encounter (Signed)
Patient calling to schedule Botox appointment with Dr. Felecia Shelling.

## 2017-05-27 MED ORDER — SERTRALINE 50 MG TABLET
ORAL_TABLET | Freq: Every day | ORAL | 2 refills | 0.00000 days | Status: CP
Start: 2017-05-27 — End: 2017-06-14

## 2017-06-14 ENCOUNTER — Ambulatory Visit: Admission: RE | Admit: 2017-06-14 | Discharge: 2017-06-14

## 2017-06-14 MED ORDER — NEEDLE (DISP) 32 GAUGE X 5/16"
Freq: Every day | 2 refills | 0 days | Status: CP
Start: 2017-06-14 — End: ?

## 2017-06-14 MED ORDER — PANTOPRAZOLE 40 MG TABLET,DELAYED RELEASE
ORAL_TABLET | Freq: Two times a day (BID) | ORAL | 2 refills | 0 days | Status: CP
Start: 2017-06-14 — End: 2017-09-12

## 2017-06-14 MED ORDER — LIRAGLUTIDE (WEIGHT LOSS) 3 MG/0.5 ML (18 MG/3 ML) SUBCUT PEN INJECTOR
INJECTION | Freq: Every day | SUBCUTANEOUS | 2 refills | 0 days | Status: CP
Start: 2017-06-14 — End: 2017-07-14

## 2017-06-16 MED ORDER — SAXENDA 3 MG/0.5 ML (18 MG/3 ML) SUBCUTANEOUS PEN INJECTOR
INJECTION | 0 refills | 0 days | Status: CP
Start: 2017-06-16 — End: ?

## 2017-07-09 ENCOUNTER — Other Ambulatory Visit: Payer: Self-pay | Admitting: Neurology

## 2017-07-22 ENCOUNTER — Encounter: Payer: Self-pay | Admitting: Neurology

## 2017-07-22 ENCOUNTER — Telehealth: Payer: Self-pay | Admitting: Neurology

## 2017-07-22 ENCOUNTER — Ambulatory Visit (INDEPENDENT_AMBULATORY_CARE_PROVIDER_SITE_OTHER): Payer: BC Managed Care – PPO | Admitting: Neurology

## 2017-07-22 VITALS — BP 139/89 | HR 79 | Resp 18 | Ht 66.0 in | Wt 288.0 lb

## 2017-07-22 DIAGNOSIS — R519 Headache, unspecified: Secondary | ICD-10-CM

## 2017-07-22 DIAGNOSIS — R9089 Other abnormal findings on diagnostic imaging of central nervous system: Secondary | ICD-10-CM

## 2017-07-22 DIAGNOSIS — G4733 Obstructive sleep apnea (adult) (pediatric): Secondary | ICD-10-CM | POA: Diagnosis not present

## 2017-07-22 DIAGNOSIS — G43709 Chronic migraine without aura, not intractable, without status migrainosus: Secondary | ICD-10-CM | POA: Diagnosis not present

## 2017-07-22 DIAGNOSIS — R51 Headache: Secondary | ICD-10-CM

## 2017-07-22 DIAGNOSIS — IMO0002 Reserved for concepts with insufficient information to code with codable children: Secondary | ICD-10-CM

## 2017-07-22 DIAGNOSIS — M542 Cervicalgia: Secondary | ICD-10-CM

## 2017-07-22 MED ORDER — OXYCODONE-ACETAMINOPHEN 10-325 MG PO TABS: 1.0000 | ORAL_TABLET | Freq: Three times a day (TID) | ORAL | 0 refills | Status: DC | PRN

## 2017-07-22 NOTE — Progress Notes (Signed)
[facility]  PATIENT: Pamela Murphy DOB: March 11, 1971 REASON FOR VISIT: Chronic Migraine headaches   HISTORICAL  CHIEF COMPLAINT:  Chief Complaint  Patient presents with  . Migraines    Here today for Botox inj.  Would like to discuss getting a new CPAP machine, as hers is old, she is having difficulty ordering supplies for it/fim  . Sleep Apnea    HISTORY OF PRESENT ILLNESS:  Pamela Murphy is a 46 year old woman with a chronic migraine disorder, obstructive sleep apnea and abnormal MRI of the brain.    Chronic Migraine/neck pain:    Her headaches continued to do well with Botox therapy. She's been on Botox since 2010 which has led to a great reduction in her headache frequency from 29-30/30 a month to just a couple of months.      When she has a migraine, pain is usually worse on the right than the left. Headaches can be triggered by flying. When present, pain is throbbing and is associated with photophobia and phonophobia. With Botox, she has fewer headaches, especially the first 2 months of each cycle.      When she has a more severe migraine she takes Toradol injections or Maxalt pills. She has tolerated Botox very well and has not had any complications.   She is more likely to get as severe migraine during the last couple weeks of each cycle.  Migraine history:  She has a long history of chronic migraines that were occurring almost every day (25-28 days/month) for 4 or more hours a day. Multiple medications were tried for prophylactic and abortive therapies with suboptimal response.    In the past, she has failed multiple medications including:  Antiepileptics (Topamax, Keppra, zonisamide), anti-depressants (Cymbalta, Prozac), multiple NSAID's, betas blockers (Inderal, atenolol), muscle relaxants (soma, tizanidine, cyclobenzaprine) in various combinations.   Once the headache occurs, she has tried triptan's with incomplete benefit much of the time. She has also tried  opiates.   IM Toradol has been most effective when one occurs.  Neck pain:  She gets a migraine she will have neck pain and she also notes neck pain at times. Botox has helped the neck pain as well. She has no pain radiating into the arms. MRI on 09/12/2014 showed mild degenerative changes at C5-C6 and C6-C7 with no definite nerve root compression.       OSA:  She continues to use her CPAP on a nightly basis. She has trouble sleeping without CPAP and she takes it with her when she travels most of the time. She has had some issues with the machine and it is time for her to get a new one.   She feels less fatigue when she uses her CPAP the next day.  She deneis much dayitme hypersomnia.   She tolerates it well.    Abnormal MRI/?MS:  She was diagnosed with MS in the past and was on interferon therapy for several years.   I "undiagnosed her" around 2010 and she got a second opinion from Dr. Terie Purser who also concurred.  MRI shows small round foci predominantly in the subcortical and deep white matter in a nonspecific manner.  Her last MRI of the brain was in 2016 and was stable compared to the previous ones.  She had had paresthesias and fatigue in the past that contributed to her diagnosis.   We have discussed that she has been stable times many years or therapy that MS is significantly less likely.  REVIEW OF SYSTEMS:  Constitutional: No fevers, chills, sweats, or change in appetite Eyes: No visual changes, double vision, eye pain Ear, nose and throat: No hearing loss, ear pain, nasal congestion, sore throat Cardiovascular: No chest pain, palpitations Respiratory:  see above GastrointestinaI: No nausea, vomiting, diarrhea, abdominal pain, fecal incontinence Genitourinary:  No dysuria, urinary retention or frequency.  No nocturia.   Dysfunctional uterine bleeding Musculoskeletal:  notes low back pain that has been worse the last couple months and neck pain that is helped by Botox Integumentary: No  rash, pruritus, skin lesions Neurological: as above Psychiatric: No depression at this time.  No anxiety Endocrine: No palpitations, diaphoresis, change in appetite, change in weigh or increased thirst Hematologic/Lymphatic:   She has anemia due to dysfunctional uterine bleeding. Allergic/Immunologic: No itchy/runny eyes, nasal congestion, recent allergic reactions, rashes  ALLERGIES: Allergies  Allergen Reactions  . Cefaclor Rash    Other Reaction: neck swelling  . Prochlorperazine Maleate Rash    Other Reaction: jittery;tachycardia  . Sulfamethoxazole-Trimethoprim Nausea Only  . Tessalon Perles [Benzonatate] Rash    HOME MEDICATIONS: Outpatient Medications Prior to Visit  Medication Sig Dispense Refill  . atenolol (TENORMIN) 25 MG tablet TAKE 1 TABLET EVERY DAY 90 tablet 3  . B-D 3CC LUER-LOK SYR 23GX1" 23G X 1" 3 ML MISC Use as needed with 1 1/2 inch needle (also dispense) 18 each 5  . Botulinum Toxin Type A (BOTOX) 200 UNITS SOLR MD to inject every 3 mos. for migraine headaches. 1 each 3  . cholecalciferol (VITAMIN D) 1000 UNITS tablet Take 2,000 Units by mouth daily.    Marland Kitchen ketorolac (TORADOL) 60 MG/2ML SOLN injection Inject up to 2 mL (60 mg) prn headache.  No more than 2 mL/day or 8 mL/month 8 mL 5  . meloxicam (MOBIC) 15 MG tablet Take 1 tablet (15 mg total) by mouth daily. 30 tablet 5  . Multiple Vitamins-Minerals (MULTIVITAMIN WITH MINERALS) tablet Take by mouth.    Marland Kitchen NEEDLE, DISP, 25 G 25G X 1-1/2" MISC     . ondansetron (ZOFRAN-ODT) 8 MG disintegrating tablet TAKE 1 TABLET (8 MG TOTAL) BY MOUTH EVERY 8 (EIGHT) HOURS AS NEEDED FOR NAUSEA OR VOMITING. 30 tablet 2  . rizatriptan (MAXALT-MLT) 10 MG disintegrating tablet TAKE 1 TABLET BY MOUTH AS NEEDED FOR MIGRAINE. MAY REPEAT IN 2 HOURS IF NEEDED 12 tablet 9  . tiZANidine (ZANAFLEX) 4 MG tablet TAKE 1 TABLET THREE TIMES A DAY 90 tablet 5  . YALE DISP NEEDLES 25GX1-1/2" 25G X 1-1/2" MISC Use for IM injections as needed 18 each 3   . oxyCODONE-acetaminophen (PERCOCET) 10-325 MG tablet Take 1 tablet by mouth every 8 (eight) hours as needed for pain. 1 pill every 4-6 hours as needed for breakthrough pain as needed 90 tablet 0  . pantoprazole (PROTONIX) 40 MG tablet Take 40 mg by mouth.     No facility-administered medications prior to visit.     PAST MEDICAL HISTORY: Past Medical History:  Diagnosis Date  . Headache   . Hypertension   . Vision abnormalities     PAST SURGICAL HISTORY: Past Surgical History:  Procedure Laterality Date  . SINUS IRRIGATION      FAMILY HISTORY: Family History  Problem Relation Age of Onset  . Breast cancer Mother   . Melanoma Mother   . Atrial fibrillation Mother   . Supraventricular tachycardia Mother   . Heart attack Father   . Hypertension Father   . Hyperlipidemia Father  SOCIAL HISTORY:  Social History   Social History  . Marital status: Single    Spouse name: N/A  . Number of children: N/A  . Years of education: N/A   Occupational History  . Not on file.   Social History Main Topics  . Smoking status: Never Smoker  . Smokeless tobacco: Never Used  . Alcohol use No     Comment: Rare  . Drug use: No  . Sexual activity: Not on file   Other Topics Concern  . Not on file   Social History Narrative  . No narrative on file     PHYSICAL EXAM  Vitals:   07/22/17 1047  BP: 139/89  Pulse: 79  Resp: 18  Weight: 288 lb (130.6 kg)  Height: 5\' 6"  (1.676 m)    Body mass index is 46.48 kg/m.   General: The patient is well-developed and well-nourished and in no acute distress  Neck: The neck has good range of motion. There is mild tenderness at the occiput..   Musculoskeletal: The back has a good range of motion is nontender.  Neurologic Exam  Mental status: The patient is alert and oriented x 3 at the time of the examination.  Speech is normal.  Cranial nerves: Extraocular movements are full.  Facial strength and sensation is normal.  Trapezius and sternocleidomastoid strength is normal.. No dysarthria is noted.   No obvious hearing deficits are noted.  Motor:   Muscle tone and bulk is normal. Strength is normal in the arms and legs.  Gait and station: Station and gait are normal.  Romberg is negative. Tandem gait is normal. DTR's:  Deep tendon reflexes are normal and symmetric.    DIAGNOSTIC DATA (LABS, IMAGING, TESTING) - I reviewed patient records, labs, notes, testing and imaging myself where available.     ASSESSMENT AND PLAN  Chronic migraine  Nonintractable headache, unspecified chronicity pattern, unspecified headache type - Plan: MR BRAIN W WO CONTRAST  Abnormal brain MRI  Obstructive sleep apnea  Neck pain   1.    Inject Botox 190 units (10 units wasted) :   Frontalis (5 U x 4), nasalis/corrugators (5 U x 3), temporalis  (5 U x 9 - 5R, 4L), occipitalis (5 U x 6), splenius capitis (15 U x 2), trapezius (15 U x 2), C6C7 paraspinal muscles (10 U x 2), wasted (10 U) 2.    We will contact her DME company and see if she qualifies for a new CPAP machine. If we need to, we may need to do another study.  3.    She will continue Maxalt and/or IM Toradol if moderate or severe headache occurs.     4.    She will return to see me in 3 months for her next Botox injection, or sooner if she has new or worsening neurologic symptoms.    Joanthan Hlavacek A. Felecia Shelling, MD, PhD 12/23/8655, 8:46 PM Certified in Neurology, Clinical Neurophysiology, Sleep Medicine, Pain Medicine and Neuroimaging  [facility] [address], [place], [place] [address] [phone]

## 2017-07-22 NOTE — Telephone Encounter (Signed)
Left a voicemail for patient to call back about scheduling mri

## 2017-07-22 NOTE — Telephone Encounter (Signed)
Patient called back and I scheduled her MRI for 07/28/17 at [facility] mobile unit.

## 2017-07-22 NOTE — Telephone Encounter (Signed)
Pt is asking for a call back from [place] re: MRI

## 2017-07-23 ENCOUNTER — Telehealth: Payer: Self-pay | Admitting: *Deleted

## 2017-07-23 NOTE — Telephone Encounter (Signed)
PA for Oxycodone 10/325 #90 completed via Cover My Meds.  Dx: Neck pain (M54.2), Lower back pain (M54.5).[place]

## 2017-07-27 ENCOUNTER — Encounter: Payer: Self-pay | Admitting: *Deleted

## 2017-07-27 NOTE — Telephone Encounter (Signed)
Fax received from [place] [employer] [place].  Oxycodone PA was denied.  Appeal letter written and faxed to [facility] appeals dept., fax# [phone]

## 2017-07-28 ENCOUNTER — Other Ambulatory Visit: Payer: BC Managed Care – PPO

## 2017-07-29 NOTE — Telephone Encounter (Signed)
Patient cancelled her appt for the [facility] mobile unit and would like to have it at [facility] for the open MRI. I faxed the order to [facility].

## 2017-08-03 ENCOUNTER — Encounter: Payer: Self-pay | Admitting: Neurology

## 2017-08-09 ENCOUNTER — Ambulatory Visit (INDEPENDENT_AMBULATORY_CARE_PROVIDER_SITE_OTHER): Payer: BC Managed Care – PPO | Admitting: Neurology

## 2017-08-09 ENCOUNTER — Telehealth: Payer: Self-pay | Admitting: Neurology

## 2017-08-09 ENCOUNTER — Encounter: Payer: Self-pay | Admitting: Neurology

## 2017-08-09 VITALS — BP 146/89 | HR 96 | Resp 18 | Ht 66.0 in | Wt 289.5 lb

## 2017-08-09 DIAGNOSIS — M542 Cervicalgia: Secondary | ICD-10-CM

## 2017-08-09 DIAGNOSIS — G43709 Chronic migraine without aura, not intractable, without status migrainosus: Secondary | ICD-10-CM

## 2017-08-09 DIAGNOSIS — IMO0002 Reserved for concepts with insufficient information to code with codable children: Secondary | ICD-10-CM

## 2017-08-09 DIAGNOSIS — R9089 Other abnormal findings on diagnostic imaging of central nervous system: Secondary | ICD-10-CM | POA: Diagnosis not present

## 2017-08-09 NOTE — Telephone Encounter (Signed)
Spoke with Pamela Murphy--she req. onb for migraine.  Appt. given 1430 today/fim
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
## 2017-08-09 NOTE — Telephone Encounter (Signed)
Patient calling to see if she can come in today to get an injection for her migraine.

## 2017-08-16 ENCOUNTER — Encounter: Payer: Self-pay | Admitting: Neurology

## 2017-08-17 ENCOUNTER — Other Ambulatory Visit: Payer: Self-pay | Admitting: *Deleted

## 2017-08-17 ENCOUNTER — Telehealth: Payer: Self-pay | Admitting: Neurology

## 2017-08-17 MED ORDER — AZITHROMYCIN 250 MG PO TABS: ORAL_TABLET | ORAL | 0 refills | Status: DC

## 2017-08-17 NOTE — Telephone Encounter (Signed)
Please let her know I got the MRI from [facility] and compared side by side with her previous MRI.  The spots in the brain are unchanged.  She does have chronic sinusitis in the sphenoid sinus, ethmoid sinuses and right frontal sinus and also has acute sinusitis in the left maxillary sinus.  Please call in a Z-Pak

## 2017-08-17 NOTE — Telephone Encounter (Signed)
Results sent to pt. via My Chart/fim

## 2017-09-06 ENCOUNTER — Encounter: Payer: Self-pay | Admitting: Neurology

## 2017-09-12 ENCOUNTER — Other Ambulatory Visit: Payer: Self-pay | Admitting: Neurology

## 2017-09-20 ENCOUNTER — Encounter: Payer: Self-pay | Admitting: Neurology

## 2017-10-01 ENCOUNTER — Encounter: Payer: Self-pay | Admitting: Neurology

## 2017-10-27 ENCOUNTER — Other Ambulatory Visit: Payer: Self-pay

## 2017-10-27 ENCOUNTER — Encounter: Payer: Self-pay | Admitting: Neurology

## 2017-10-27 ENCOUNTER — Ambulatory Visit: Payer: BC Managed Care – PPO | Admitting: Neurology

## 2017-10-27 VITALS — BP 130/78 | HR 78 | Resp 16 | Ht 66.0 in | Wt 289.0 lb

## 2017-10-27 DIAGNOSIS — G4733 Obstructive sleep apnea (adult) (pediatric): Secondary | ICD-10-CM

## 2017-10-27 DIAGNOSIS — G43709 Chronic migraine without aura, not intractable, without status migrainosus: Secondary | ICD-10-CM

## 2017-10-27 DIAGNOSIS — M542 Cervicalgia: Secondary | ICD-10-CM | POA: Diagnosis not present

## 2017-10-27 DIAGNOSIS — IMO0002 Reserved for concepts with insufficient information to code with codable children: Secondary | ICD-10-CM

## 2017-10-27 NOTE — Progress Notes (Signed)
[facility]  PATIENT: Pamela Murphy DOB: 1971/10/11 REASON FOR VISIT: Chronic Migraine headaches   HISTORICAL  CHIEF COMPLAINT:  Chief Complaint  Patient presents with  . Migraines    Botox 200u, 1 vial, buy and bill.  Lot# [id number].  Exp. 02/2020.  NDC 0023-1145-02/fim    HISTORY OF PRESENT ILLNESS:  Pamela Murphy is a 46 year old woman with a chronic migraine disorder, obstructive sleep apnea and abnormal MRI of the brain.   Update 10/27/17:  She got her new machine through [employer].   She notes it seems to be working and she is compliant (AHI = 0.7 and compliance is 100%).    However, she feels the pressure is much lower than the other machine.    We discussed switching to Auto-PAP.      Her chronic migraines are doing well.   She did have a lot of migraines the first 2 -3 weeks of the cycle but then had very few the past 2- 2 1/2 months.  When present, headaches are associated with nausea, photophobia and phonophobia. Moving will make the headaches worse She notes some neck pain.  From 07/22/2017:     Chronic Migraine/neck pain:    Her headaches continued to do well with Botox therapy. She's been on Botox since 2010 which has led to a great reduction in her headache frequency from 29-30/30 a month to just a couple of months.      When she has a migraine, pain is usually worse on the right than the left. Headaches can be triggered by flying. When present, pain is throbbing and is associated with photophobia and phonophobia. With Botox, she has fewer headaches, especially the first 2 months of each cycle.      When she has a more severe migraine she takes Toradol injections or Maxalt pills. She has tolerated Botox very well and has not had any complications.   She is more likely to get as severe migraine during the last couple weeks of each cycle.  Migraine history:  She has a long history of chronic migraines that were occurring almost every day (25-28 days/month) for 4  or more hours a day. Multiple medications were tried for prophylactic and abortive therapies with suboptimal response.    In the past, she has failed multiple medications including:  Antiepileptics (Topamax, Keppra, zonisamide), anti-depressants (Cymbalta, Prozac), multiple NSAID's, betas blockers (Inderal, atenolol), muscle relaxants (soma, tizanidine, cyclobenzaprine) in various combinations.   Once the headache occurs, she has tried triptan's with incomplete benefit much of the time. She has also tried opiates.   IM Toradol has been most effective when one occurs.  Neck pain:  She gets a migraine she will have neck pain and she also notes neck pain at times. Botox has helped the neck pain as well. She has no pain radiating into the arms. MRI on 09/12/2014 showed mild degenerative changes at C5-C6 and C6-C7 with no definite nerve root compression.       OSA:  She continues to use her CPAP on a nightly basis. She has trouble sleeping without CPAP and she takes it with her when she travels most of the time. She has had some issues with the machine and it is time for her to get a new one.   She feels less fatigue when she uses her CPAP the next day.  She deneis much dayitme hypersomnia.   She tolerates it well.    Abnormal MRI/?MS:  She was diagnosed with  MS in the past and was on interferon therapy for several years.   I "undiagnosed her" around 2010 and she got a second opinion from Dr. Terie Purser who also concurred.  MRI shows small round foci predominantly in the subcortical and deep white matter in a nonspecific manner.  Her last MRI of the brain was in 2016 and was stable compared to the previous ones.  She had had paresthesias and fatigue in the past that contributed to her diagnosis.   We have discussed that she has been stable times many years or therapy that MS is significantly less likely.   REVIEW OF SYSTEMS:  Constitutional: No fevers, chills, sweats, or change in appetite Eyes: No visual  changes, double vision, eye pain Ear, nose and throat: No hearing loss, ear pain, nasal congestion, sore throat Cardiovascular: No chest pain, palpitations Respiratory:  see above GastrointestinaI: No nausea, vomiting, diarrhea, abdominal pain, fecal incontinence Genitourinary:  No dysuria, urinary retention or frequency.  No nocturia.   Dysfunctional uterine bleeding Musculoskeletal:  notes low back pain that has been worse the last couple months and neck pain that is helped by Botox Integumentary: No rash, pruritus, skin lesions Neurological: as above Psychiatric: No depression at this time.  No anxiety Endocrine: No palpitations, diaphoresis, change in appetite, change in weigh or increased thirst Hematologic/Lymphatic:   She has anemia due to dysfunctional uterine bleeding. Allergic/Immunologic: No itchy/runny eyes, nasal congestion, recent allergic reactions, rashes  ALLERGIES: Allergies  Allergen Reactions  . Cefaclor Rash    Other Reaction: neck swelling  . Prochlorperazine Maleate Rash    Other Reaction: jittery;tachycardia  . Sulfamethoxazole-Trimethoprim Nausea Only  . Tessalon Perles [Benzonatate] Rash    HOME MEDICATIONS: Outpatient Medications Prior to Visit  Medication Sig Dispense Refill  . atenolol (TENORMIN) 25 MG tablet TAKE 1 TABLET EVERY DAY 90 tablet 3  . B-D 3CC LUER-LOK SYR 23GX1" 23G X 1" 3 ML MISC Use as needed with 1 1/2 inch needle (also dispense) 18 each 5  . Botulinum Toxin Type A (BOTOX) 200 UNITS SOLR MD to inject every 3 mos. for migraine headaches. 1 each 3  . cholecalciferol (VITAMIN D) 1000 UNITS tablet Take 2,000 Units by mouth daily.    Marland Kitchen ketorolac (TORADOL) 60 MG/2ML SOLN injection Inject up to 2 mL (60 mg) prn headache.  No more than 2 mL/day or 8 mL/month 8 mL 5  . meloxicam (MOBIC) 15 MG tablet Take 1 tablet (15 mg total) by mouth daily. 30 tablet 5  . Multiple Vitamins-Minerals (MULTIVITAMIN WITH MINERALS) tablet Take by mouth.    Marland Kitchen NEEDLE,  DISP, 25 G 25G X 1-1/2" MISC     . ondansetron (ZOFRAN-ODT) 8 MG disintegrating tablet TAKE 1 TABLET (8 MG TOTAL) BY MOUTH EVERY 8 (EIGHT) HOURS AS NEEDED FOR NAUSEA OR VOMITING. 30 tablet 2  . oxyCODONE-acetaminophen (PERCOCET) 10-325 MG tablet Take 1 tablet by mouth every 8 (eight) hours as needed for pain. 1 pill every 4-6 hours as needed for breakthrough pain as needed 90 tablet 0  . rizatriptan (MAXALT-MLT) 10 MG disintegrating tablet TAKE 1 TABLET BY MOUTH AS NEEDED FOR MIGRAINE. MAY REPEAT IN 2 HOURS IF NEEDED 12 tablet 9  . tiZANidine (ZANAFLEX) 4 MG tablet TAKE 1 TABLET THREE TIMES A DAY 90 tablet 5  . YALE DISP NEEDLES 25GX1-1/2" 25G X 1-1/2" MISC Use for IM injections as needed 18 each 3  . azithromycin (ZITHROMAX Z-PAK) 250 MG tablet Take 2 tablets on day 1, then 1 tablet  daily until gone. (Patient not taking: Reported on 10/27/2017) 6 each 0  . pantoprazole (PROTONIX) 40 MG tablet Take 40 mg by mouth.     No facility-administered medications prior to visit.     PAST MEDICAL HISTORY: Past Medical History:  Diagnosis Date  . Headache   . Hypertension   . Vision abnormalities     PAST SURGICAL HISTORY: Past Surgical History:  Procedure Laterality Date  . SINUS IRRIGATION      FAMILY HISTORY: Family History  Problem Relation Age of Onset  . Breast cancer Mother   . Melanoma Mother   . Atrial fibrillation Mother   . Supraventricular tachycardia Mother   . Heart attack Father   . Hypertension Father   . Hyperlipidemia Father     SOCIAL HISTORY:  Social History   Socioeconomic History  . Marital status: Single    Spouse name: Not on file  . Number of children: Not on file  . Years of education: Not on file  . Highest education level: Not on file  Social Needs  . Financial resource strain: Not on file  . Food insecurity - worry: Not on file  . Food insecurity - inability: Not on file  . Transportation needs - medical: Not on file  . Transportation needs -  non-medical: Not on file  Occupational History  . Not on file  Tobacco Use  . Smoking status: Never Smoker  . Smokeless tobacco: Never Used  Substance and Sexual Activity  . Alcohol use: No    Alcohol/week: 0.0 oz    Comment: Rare  . Drug use: No  . Sexual activity: Not on file  Other Topics Concern  . Not on file  Social History Narrative  . Not on file     PHYSICAL EXAM  Vitals:   10/27/17 1536  BP: 130/78  Pulse: 78  Resp: 16  Weight: 289 lb (131.1 kg)  Height: 5\' 6"  (1.676 m)    Body mass index is 46.65 kg/m.   General: The patient is well-developed and well-nourished and in no acute distress  Neck: The neck has good range of motion. There is mild tenderness at the occiput..   Musculoskeletal: The back has a good range of motion is nontender.  Neurologic Exam  Mental status: The patient is alert and oriented x 3 at the time of the examination.  Speech is normal.  Cranial nerves: Extraocular movements are full.  Facial strength and sensation is normal. Trapezius and sternocleidomastoid strength is normal.. No dysarthria is noted.   No obvious hearing deficits are noted.  Motor:   Muscle tone and bulk is normal. Strength is normal in the arms and legs.  Gait and station: Station and gait are normal.  Romberg is negative. Tandem gait is normal. DTR's:  Deep tendon reflexes are normal and symmetric.    DIAGNOSTIC DATA (LABS, IMAGING, TESTING) - I reviewed patient records, labs, notes, testing and imaging myself where available.     ASSESSMENT AND PLAN  Chronic migraine  Obstructive sleep apnea  Neck pain   1.    Inject Botox 190 units (10 units wasted) :   Frontalis (5 U x 4), nasalis/corrugators (5 U x 3), temporalis  (5 U x 9 - 5R, 4L), occipitalis (5 U x 6), splenius capitis (15 U x 2), trapezius (15 U x 2), C6C7 paraspinal muscles (10 U x 2), wasted (10 U) 2.    We will contact her DME company and see if she  qualifies for a new CPAP machine. If  we need to, we may need to do another study.  3.    She will continue Maxalt and/or IM Toradol if moderate or severe headache occurs.     4.    She will return to see me in 3 months for her next Botox injection, or sooner if she has new or worsening neurologic symptoms.    Corrigan Kretschmer A. Felecia Shelling, MD, PhD 67/05/339, 3:52 PM Certified in Neurology, Clinical Neurophysiology, Sleep Medicine, Pain Medicine and Neuroimaging  [facility] [address], [place], [place] [address] [phone]

## 2017-11-19 DIAGNOSIS — N841 Polyp of cervix uteri: Secondary | ICD-10-CM | POA: Insufficient documentation

## 2017-12-26 ENCOUNTER — Other Ambulatory Visit: Payer: Self-pay | Admitting: Neurology

## 2018-01-27 ENCOUNTER — Other Ambulatory Visit: Payer: Self-pay

## 2018-01-27 ENCOUNTER — Ambulatory Visit: Payer: BC Managed Care – PPO | Admitting: Neurology

## 2018-01-27 ENCOUNTER — Telehealth: Payer: Self-pay | Admitting: Neurology

## 2018-01-27 ENCOUNTER — Encounter: Payer: Self-pay | Admitting: Neurology

## 2018-01-27 DIAGNOSIS — G43709 Chronic migraine without aura, not intractable, without status migrainosus: Secondary | ICD-10-CM

## 2018-01-27 DIAGNOSIS — G4733 Obstructive sleep apnea (adult) (pediatric): Secondary | ICD-10-CM | POA: Diagnosis not present

## 2018-01-27 DIAGNOSIS — M545 Low back pain, unspecified: Secondary | ICD-10-CM

## 2018-01-27 DIAGNOSIS — IMO0002 Reserved for concepts with insufficient information to code with codable children: Secondary | ICD-10-CM

## 2018-01-27 MED ORDER — OXYCODONE-ACETAMINOPHEN 10-325 MG PO TABS: 1.0000 | ORAL_TABLET | Freq: Three times a day (TID) | ORAL | 0 refills | Status: DC | PRN

## 2018-01-27 MED ORDER — KETOROLAC TROMETHAMINE 60 MG/2ML IM SOLN: INTRAMUSCULAR | 5 refills | Status: DC

## 2018-01-27 NOTE — Progress Notes (Signed)
[facility]  PATIENT: Pamela Murphy DOB: 1971/03/29 REASON FOR VISIT: Chronic Migraine headaches   HISTORICAL  CHIEF COMPLAINT:  Chief Complaint  Patient presents with  . Migraines    Botox 100u, 2 vials.  Buy and Bill.  Lot# [id number].  Exp. 06/2020.  NDC 0023-1145-01/fim  . Sleep Apnea    HISTORY OF PRESENT ILLNESS:  Pamela Murphy is a 47 year old woman with a chronic migraine disorder, obstructive sleep apnea and abnormal MRI of the brain.   Update 01/27/2018: She feels her migraines are about the same.    Botox has helped a lot.   She has been on it for about 9 years.   She still notes that headaches are worse with barometric changes and traveling.    For breakthrough migraines at a more severe she will sometimes do Toradol shots with benefit. When present, pain is throbbing and associated with photophobia, phonophobia and nausea.  Her OSA does well. She uses CPAP nightly. She feels better the next morning and day when she uses CPAP at night. She has lost some 10-12 pounds since the last visit and she feels a little better.  Her lower back pain is a little bit better with the weight loss.    Update 10/27/17:  She got her new machine through [employer].   She notes it seems to be working and she is compliant (AHI = 0.7 and compliance is 100%).    However, she feels the pressure is much lower than the other machine.    We discussed switching to Auto-PAP.      Her chronic migraines are doing well.   She did have a lot of migraines the first 2 -3 weeks of the cycle but then had very few the past 2- 2 1/2 months.  When present, headaches are associated with nausea, photophobia and phonophobia. Moving will make the headaches worse She notes some neck pain.  From 07/22/2017:     Chronic Migraine/neck pain:    Her headaches continued to do well with Botox therapy. She's been on Botox since 2010 which has led to a great reduction in her headache frequency from 29-30/30 a month  to just a couple of months.      When she has a migraine, pain is usually worse on the right than the left. Headaches can be triggered by flying. When present, pain is throbbing and is associated with photophobia and phonophobia. With Botox, she has fewer headaches, especially the first 2 months of each cycle.      When she has a more severe migraine she takes Toradol injections or Maxalt pills. She has tolerated Botox very well and has not had any complications.   She is more likely to get as severe migraine during the last couple weeks of each cycle.  Migraine history:  She has a long history of chronic migraines that were occurring almost every day (25-28 days/month) for 4 or more hours a day. Multiple medications were tried for prophylactic and abortive therapies with suboptimal response.    In the past, she has failed multiple medications including:  Antiepileptics (Topamax, Keppra, zonisamide), anti-depressants (Cymbalta, Prozac), multiple NSAID's, betas blockers (Inderal, atenolol), muscle relaxants (soma, tizanidine, cyclobenzaprine) in various combinations.   Once the headache occurs, she has tried triptan's with incomplete benefit much of the time. She has also tried opiates.   IM Toradol has been most effective when one occurs.  Neck pain:  She gets a migraine she will have neck  pain and she also notes neck pain at times. Botox has helped the neck pain as well. She has no pain radiating into the arms. MRI on 09/12/2014 showed mild degenerative changes at C5-C6 and C6-C7 with no definite nerve root compression.       OSA:  She continues to use her CPAP on a nightly basis. She has trouble sleeping without CPAP and she takes it with her when she travels most of the time. She has had some issues with the machine and it is time for her to get a new one.   She feels less fatigue when she uses her CPAP the next day.  She deneis much dayitme hypersomnia.   She tolerates it well.    Abnormal MRI/?MS:  She  was diagnosed with MS in the past and was on interferon therapy for several years.   I "undiagnosed her" around 2010 and she got a second opinion from Dr. Terie Purser who also concurred.  MRI shows small round foci predominantly in the subcortical and deep white matter in a nonspecific manner.  Her last MRI of the brain was in 2016 and was stable compared to the previous ones.  She had had paresthesias and fatigue in the past that contributed to her diagnosis.   We have discussed that she has been stable times many years or therapy that MS is significantly less likely.   REVIEW OF SYSTEMS:  Constitutional: No fevers, chills, sweats, or change in appetite Eyes: No visual changes, double vision, eye pain Ear, nose and throat: No hearing loss, ear pain, nasal congestion, sore throat Cardiovascular: No chest pain, palpitations Respiratory:  see above GastrointestinaI: No nausea, vomiting, diarrhea, abdominal pain, fecal incontinence Genitourinary:  No dysuria, urinary retention or frequency.  No nocturia.   Dysfunctional uterine bleeding Musculoskeletal:  notes low back pain that has been worse the last couple months and neck pain that is helped by Botox Integumentary: No rash, pruritus, skin lesions Neurological: as above Psychiatric: No depression at this time.  No anxiety Endocrine: No palpitations, diaphoresis, change in appetite, change in weigh or increased thirst Hematologic/Lymphatic:   She has anemia due to dysfunctional uterine bleeding. Allergic/Immunologic: No itchy/runny eyes, nasal congestion, recent allergic reactions, rashes  ALLERGIES: Allergies  Allergen Reactions  . Cefaclor Rash    Other Reaction: neck swelling  . Prochlorperazine Maleate Rash    Other Reaction: jittery;tachycardia  . Sulfamethoxazole-Trimethoprim Nausea Only  . Tessalon Perles [Benzonatate] Rash    HOME MEDICATIONS: Outpatient Medications Prior to Visit  Medication Sig Dispense Refill  . atenolol  (TENORMIN) 25 MG tablet TAKE 1 TABLET EVERY DAY 90 tablet 3  . B-D 3CC LUER-LOK SYR 23GX1" 23G X 1" 3 ML MISC Use as needed with 1 1/2 inch needle (also dispense) 18 each 5  . Botulinum Toxin Type A (BOTOX) 200 UNITS SOLR MD to inject every 3 mos. for migraine headaches. 1 each 3  . cholecalciferol (VITAMIN D) 1000 UNITS tablet Take 2,000 Units by mouth daily.    . meloxicam (MOBIC) 15 MG tablet TAKE 1 TABLET BY MOUTH EVERY DAY 30 tablet 1  . Multiple Vitamins-Minerals (MULTIVITAMIN WITH MINERALS) tablet Take by mouth.    Marland Kitchen NEEDLE, DISP, 25 G 25G X 1-1/2" MISC     . ondansetron (ZOFRAN-ODT) 8 MG disintegrating tablet TAKE 1 TABLET (8 MG TOTAL) BY MOUTH EVERY 8 (EIGHT) HOURS AS NEEDED FOR NAUSEA OR VOMITING. 30 tablet 2  . rizatriptan (MAXALT-MLT) 10 MG disintegrating tablet TAKE 1 TABLET BY  MOUTH AS NEEDED FOR MIGRAINE. MAY REPEAT IN 2 HOURS IF NEEDED 12 tablet 9  . tiZANidine (ZANAFLEX) 4 MG tablet TAKE 1 TABLET THREE TIMES A DAY 90 tablet 5  . YALE DISP NEEDLES 25GX1-1/2" 25G X 1-1/2" MISC Use for IM injections as needed 18 each 3  . ketorolac (TORADOL) 60 MG/2ML SOLN injection Inject up to 2 mL (60 mg) prn headache.  No more than 2 mL/day or 8 mL/month 8 mL 5  . oxyCODONE-acetaminophen (PERCOCET) 10-325 MG tablet Take 1 tablet by mouth every 8 (eight) hours as needed for pain. 1 pill every 4-6 hours as needed for breakthrough pain as needed 90 tablet 0  . azithromycin (ZITHROMAX Z-PAK) 250 MG tablet Take 2 tablets on day 1, then 1 tablet daily until gone. (Patient not taking: Reported on 10/27/2017) 6 each 0  . pantoprazole (PROTONIX) 40 MG tablet Take 40 mg by mouth.     No facility-administered medications prior to visit.     PAST MEDICAL HISTORY: Past Medical History:  Diagnosis Date  . Headache   . Hypertension   . Vision abnormalities     PAST SURGICAL HISTORY: Past Surgical History:  Procedure Laterality Date  . SINUS IRRIGATION      FAMILY HISTORY: Family History  Problem  Relation Age of Onset  . Breast cancer Mother   . Melanoma Mother   . Atrial fibrillation Mother   . Supraventricular tachycardia Mother   . Heart attack Father   . Hypertension Father   . Hyperlipidemia Father     SOCIAL HISTORY:  Social History   Socioeconomic History  . Marital status: Single    Spouse name: Not on file  . Number of children: Not on file  . Years of education: Not on file  . Highest education level: Not on file  Social Needs  . Financial resource strain: Not on file  . Food insecurity - worry: Not on file  . Food insecurity - inability: Not on file  . Transportation needs - medical: Not on file  . Transportation needs - non-medical: Not on file  Occupational History  . Not on file  Tobacco Use  . Smoking status: Never Smoker  . Smokeless tobacco: Never Used  Substance and Sexual Activity  . Alcohol use: No    Alcohol/week: 0.0 oz    Comment: Rare  . Drug use: No  . Sexual activity: Not on file  Other Topics Concern  . Not on file  Social History Narrative  . Not on file     PHYSICAL EXAM  There were no vitals filed for this visit.  There is no height or weight on file to calculate BMI.   General: The patient is well-developed and well-nourished and in no acute distress  Musculoskeletal: The back has a good range of motion is nontender.   Neck has good range of motion and is currently nontender.  Neurologic Exam  Mental status: The patient is alert and oriented x 3 at the time of the examination.  Speech is normal.  Cranial nerves: Extraocular movements are full.  Administration strength and sensation is normal. Trapezius strength is normal.   No obvious hearing deficits are noted.  Motor:   Muscle tone and bulk is normal. Strength is normal in the arms and legs.  Gait and station: Station and gait are normal.  Romberg is negative. Tandem gait is normal. DTR's:  Deep tendon reflexes are normal and symmetric.    DIAGNOSTIC DATA  (LABS,  IMAGING, TESTING) - I reviewed patient records, labs, notes, testing and imaging myself where available.     ASSESSMENT AND PLAN  Chronic migraine  Obstructive sleep apnea  Midline low back pain without sciatica, unspecified chronicity   1.    Inject Botox 190 units (10 units wasted) :   Frontalis (5 U x 4), nasalis/corrugators (5 U x 3), temporalis  (5 U x 9 - 5R, 4L), occipitalis (5 U x 6), splenius capitis (15 U x 2), trapezius (15 U x 2), C6C7 paraspinal muscles (10 U x 2), wasted (10 U) 2.    Continue to use CPAP nightly.  3.    She will continue Maxalt and/or IM Toradol if moderate or severe headache occurs.     4.    return to see me in about 3 months for the next Botox injection, or sooner if she has new or worsening neurologic symptoms.    Richard A. Felecia Shelling, MD, PhD 04/27/7845, 9:62 PM Certified in Neurology, Clinical Neurophysiology, Sleep Medicine, Pain Medicine and Neuroimaging  [facility] [address] [place], [place] [address] [phone]

## 2018-01-27 NOTE — Telephone Encounter (Signed)
I made her 3 month botox appt. Hopefully this is okay.

## 2018-01-31 ENCOUNTER — Other Ambulatory Visit: Payer: Self-pay | Admitting: Neurology

## 2018-01-31 NOTE — Telephone Encounter (Signed)
Noted, thank you

## 2018-02-15 ENCOUNTER — Telehealth: Payer: Self-pay | Admitting: Neurology

## 2018-02-15 NOTE — Telephone Encounter (Signed)
Patients Botox has been approved, authorization is [id number] and [phone] (01/25/19), patient is B/B.

## 2018-02-16 ENCOUNTER — Encounter: Payer: Self-pay | Admitting: Neurology

## 2018-03-08 ENCOUNTER — Other Ambulatory Visit: Payer: Self-pay | Admitting: Neurology

## 2018-04-03 ENCOUNTER — Other Ambulatory Visit: Payer: Self-pay | Admitting: Neurology

## 2018-04-26 NOTE — Telephone Encounter (Signed)
I called [employer] to make sure the auths were still valid and billable, they are. Ref#[id number]. 04/26/18. DW

## 2018-05-02 ENCOUNTER — Encounter: Payer: Self-pay | Admitting: Neurology

## 2018-05-02 ENCOUNTER — Telehealth: Payer: Self-pay | Admitting: Neurology

## 2018-05-02 ENCOUNTER — Ambulatory Visit: Payer: BC Managed Care – PPO | Admitting: Neurology

## 2018-05-02 VITALS — BP 147/90 | HR 85 | Ht 66.0 in | Wt 282.0 lb

## 2018-05-02 DIAGNOSIS — G4733 Obstructive sleep apnea (adult) (pediatric): Secondary | ICD-10-CM

## 2018-05-02 DIAGNOSIS — IMO0002 Reserved for concepts with insufficient information to code with codable children: Secondary | ICD-10-CM

## 2018-05-02 DIAGNOSIS — G43709 Chronic migraine without aura, not intractable, without status migrainosus: Secondary | ICD-10-CM | POA: Diagnosis not present

## 2018-05-02 DIAGNOSIS — R9089 Other abnormal findings on diagnostic imaging of central nervous system: Secondary | ICD-10-CM

## 2018-05-02 NOTE — Progress Notes (Signed)
[facility]  PATIENT: Pamela Murphy DOB: 1971-08-13 REASON FOR VISIT: Chronic Migraine headaches   HISTORICAL  CHIEF COMPLAINT:  Chief Complaint  Patient presents with  . Migraines    Botox 100u, 2 vials. Lot# [id number]. Exp. 11/21.  NDC 0023-1145-01/fim    HISTORY OF PRESENT ILLNESS:  Pamela Murphy is a 47 year old woman with a chronic migraine disorder, obstructive sleep apnea and abnormal MRI of the brain.   Update 05/02/2018: Migraines continue to do fairly well on Botox.  She has tolerated the injections well.  She has been on the therapy for 9 to 10 years.  Headaches are most likely to occur when she travels.  Migraine pain is throbbing and is associated with nausea, photophobia and phonophobia.  Moving will worsen the pain.  When a more severe migraine occurs, she often takes self-administered IM Toradol with benefit.  She is on CPAP nightly for obstructive sleep apnea with benefit.  With CPAP she generally feels more alert and less tired and sleepy the next day.  Update 01/27/2018: She feels her migraines are about the same.    Botox has helped a lot.   She has been on it for about 9 years.   She still notes that headaches are worse with barometric changes and traveling.    For breakthrough migraines at a more severe she will sometimes do Toradol shots with benefit. When present, pain is throbbing and associated with photophobia, phonophobia and nausea.  Her OSA does well. She uses CPAP nightly. She feels better the next morning and day when she uses CPAP at night. She has lost some 10-12 pounds since the last visit and she feels a little better.  Her lower back pain is a little bit better with the weight loss.    Update 10/27/17:  She got her new machine through [facility].   She notes it seems to be working and she is compliant (AHI = 0.7 and compliance is 100%).    However, she feels the pressure is much lower than the other machine.    We discussed switching to  Auto-PAP.      Her chronic migraines are doing well.   She did have a lot of migraines the first 2 -3 weeks of the cycle but then had very few the past 2- 2 1/2 months.  When present, headaches are associated with nausea, photophobia and phonophobia. Moving will make the headaches worse She notes some neck pain.  From 07/22/2017:     Chronic Migraine/neck pain:    Her headaches continued to do well with Botox therapy. She's been on Botox since 2010 which has led to a great reduction in her headache frequency from 29-30/30 a month to just a couple of months.      When she has a migraine, pain is usually worse on the right than the left. Headaches can be triggered by flying. When present, pain is throbbing and is associated with photophobia and phonophobia. With Botox, she has fewer headaches, especially the first 2 months of each cycle.      When she has a more severe migraine she takes Toradol injections or Maxalt pills. She has tolerated Botox very well and has not had any complications.   She is more likely to get as severe migraine during the last couple weeks of each cycle.  Migraine history:  She has a long history of chronic migraines that were occurring almost every day (25-28 days/month) for 4 or more hours a  day. Multiple medications were tried for prophylactic and abortive therapies with suboptimal response.    In the past, she has failed multiple medications including:  Antiepileptics (Topamax, Keppra, zonisamide), anti-depressants (Cymbalta, Prozac), multiple NSAID's, betas blockers (Inderal, atenolol), muscle relaxants (soma, tizanidine, cyclobenzaprine) in various combinations.   Once the headache occurs, she has tried triptan's with incomplete benefit much of the time. She has also tried opiates.   IM Toradol has been most effective when one occurs.  Neck pain:  She gets a migraine she will have neck pain and she also notes neck pain at times. Botox has helped the neck pain as well. She has  no pain radiating into the arms. MRI on 09/12/2014 showed mild degenerative changes at C5-C6 and C6-C7 with no definite nerve root compression.       OSA:  She continues to use her CPAP on a nightly basis. She has trouble sleeping without CPAP and she takes it with her when she travels most of the time. She has had some issues with the machine and it is time for her to get a new one.   She feels less fatigue when she uses her CPAP the next day.  She deneis much dayitme hypersomnia.   She tolerates it well.    Abnormal MRI/?MS:  She was diagnosed with MS in the past and was on interferon therapy for several years.   I "undiagnosed her" around 2010 and she got a second opinion from Dr. Terie Purser who also concurred.  MRI shows small round foci predominantly in the subcortical and deep white matter in a nonspecific manner.  Her last MRI of the brain was in 2016 and was stable compared to the previous ones.  She had had paresthesias and fatigue in the past that contributed to her diagnosis.   We have discussed that she has been stable times many years or therapy that MS is significantly less likely.   REVIEW OF SYSTEMS:  Constitutional: No fevers, chills, sweats, or change in appetite Eyes: No visual changes, double vision, eye pain Ear, nose and throat: No hearing loss, ear pain, nasal congestion, sore throat Cardiovascular: No chest pain, palpitations Respiratory:  see above GastrointestinaI: No nausea, vomiting, diarrhea, abdominal pain, fecal incontinence Genitourinary:  No dysuria, urinary retention or frequency.  No nocturia.   Dysfunctional uterine bleeding Musculoskeletal:  notes low back pain that has been worse the last couple months and neck pain that is helped by Botox Integumentary: No rash, pruritus, skin lesions Neurological: as above Psychiatric: No depression at this time.  No anxiety Endocrine: No palpitations, diaphoresis, change in appetite, change in weigh or increased  thirst Hematologic/Lymphatic:   She has anemia due to dysfunctional uterine bleeding. Allergic/Immunologic: No itchy/runny eyes, nasal congestion, recent allergic reactions, rashes  ALLERGIES: Allergies  Allergen Reactions  . Cefaclor Rash    Other Reaction: neck swelling  . Prochlorperazine Maleate Rash    Other Reaction: jittery;tachycardia  . Sulfamethoxazole-Trimethoprim Nausea Only  . Tessalon Perles [Benzonatate] Rash    HOME MEDICATIONS: Outpatient Medications Prior to Visit  Medication Sig Dispense Refill  . atenolol (TENORMIN) 25 MG tablet TAKE 1 TABLET BY MOUTH EVERY DAY 90 tablet 3  . B-D 3CC LUER-LOK SYR 23GX1" 23G X 1" 3 ML MISC Use as needed with 1 1/2 inch needle (also dispense) 18 each 5  . Botulinum Toxin Type A (BOTOX) 200 UNITS SOLR MD to inject every 3 mos. for migraine headaches. 1 each 3  . cholecalciferol (VITAMIN D) 1000  UNITS tablet Take 2,000 Units by mouth daily.    Marland Kitchen ketorolac (TORADOL) 60 MG/2ML SOLN injection Inject up to 2 mL (60 mg) prn headache.  No more than 2 mL/day or 8 mL/month 8 mL 5  . meloxicam (MOBIC) 15 MG tablet TAKE 1 TABLET BY MOUTH EVERY DAY 30 tablet 1  . Multiple Vitamins-Minerals (MULTIVITAMIN WITH MINERALS) tablet Take by mouth.    Marland Kitchen NEEDLE, DISP, 25 G 25G X 1-1/2" MISC     . ondansetron (ZOFRAN-ODT) 8 MG disintegrating tablet TAKE 1 TABLET (8 MG TOTAL) BY MOUTH EVERY 8 (EIGHT) HOURS AS NEEDED FOR NAUSEA OR VOMITING. 30 tablet 2  . oxyCODONE-acetaminophen (PERCOCET) 10-325 MG tablet Take 1 tablet by mouth every 8 (eight) hours as needed for pain. 1 pill every 4-6 hours as needed for breakthrough pain as needed 90 tablet 0  . rizatriptan (MAXALT-MLT) 10 MG disintegrating tablet TAKE 1 TABLET BY MOUTH AS NEEDED FOR MIGRAINE. MAY REPEAT IN 2 HOURS IF NEEDED 12 tablet 9  . tiZANidine (ZANAFLEX) 4 MG tablet TAKE 1 TABLET THREE TIMES A DAY 90 tablet 5  . YALE DISP NEEDLES 25GX1-1/2" 25G X 1-1/2" MISC Use for IM injections as needed 18 each 3   . pantoprazole (PROTONIX) 40 MG tablet Take 40 mg by mouth.     No facility-administered medications prior to visit.     PAST MEDICAL HISTORY: Past Medical History:  Diagnosis Date  . Headache   . Hypertension   . Vision abnormalities     PAST SURGICAL HISTORY: Past Surgical History:  Procedure Laterality Date  . SINUS IRRIGATION      FAMILY HISTORY: Family History  Problem Relation Age of Onset  . Breast cancer Mother   . Melanoma Mother   . Atrial fibrillation Mother   . Supraventricular tachycardia Mother   . Heart attack Father   . Hypertension Father   . Hyperlipidemia Father     SOCIAL HISTORY:  Social History   Socioeconomic History  . Marital status: Single    Spouse name: Not on file  . Number of children: Not on file  . Years of education: Not on file  . Highest education level: Not on file  Occupational History  . Not on file  Social Needs  . Financial resource strain: Not on file  . Food insecurity:    Worry: Not on file    Inability: Not on file  . Transportation needs:    Medical: Not on file    Non-medical: Not on file  Tobacco Use  . Smoking status: Never Smoker  . Smokeless tobacco: Never Used  Substance and Sexual Activity  . Alcohol use: No    Alcohol/week: 0.0 oz    Comment: Rare  . Drug use: No  . Sexual activity: Not on file  Lifestyle  . Physical activity:    Days per week: Not on file    Minutes per session: Not on file  . Stress: Not on file  Relationships  . Social connections:    Talks on phone: Not on file    Gets together: Not on file    Attends religious service: Not on file    Active member of club or organization: Not on file    Attends meetings of clubs or organizations: Not on file    Relationship status: Not on file  . Intimate partner violence:    Fear of current or ex partner: Not on file    Emotionally abused: Not on file  Physically abused: Not on file    Forced sexual activity: Not on file  Other  Topics Concern  . Not on file  Social History Narrative  . Not on file     PHYSICAL EXAM  Vitals:   05/02/18 1430  BP: (!) 147/90  Pulse: 85  Weight: 282 lb (127.9 kg)  Height: 5\' 6"  (1.676 m)    Body mass index is 45.52 kg/m.   General: The patient is well-developed and well-nourished and in no acute distress  Musculoskeletal: The back has a good range of motion is nontender.   Neck has good range of motion and is currently nontender.  Neurologic Exam  Mental status: The patient is alert and oriented x 3 at the time of the examination.  Speech is normal.  Cranial nerves: Extraocular movements are full.  Administration strength and sensation is normal. Trapezius strength is normal.   No obvious hearing deficits are noted.  Motor:   Muscle tone and bulk is normal. She has normal strength.    Gait and station: Station and gait are normal.  Romberg is negative. Tandem gait is normal. DTR's:      DIAGNOSTIC DATA (LABS, IMAGING, TESTING) - I reviewed patient records, labs, notes, testing and imaging myself where available.     ASSESSMENT AND PLAN  Chronic migraine  Obstructive sleep apnea  Abnormal brain MRI   1.    Inject Botox 190 units (10 units wasted) :   Frontalis (5 U x 4), nasalis/corrugators (5 U x 3), temporalis  (5 U x 9 - 5R, 4L), occipitalis (5 U x 6), splenius capitis (15 U x 2), trapezius (15 U x 2), C6C7 paraspinal muscles (10 U x 2), wasted (10 U) 2.    If a migraine occurs, can use Maxalt and/or IM Toradol if moderate or severe headache occurs.     3.  Use CPAP nightly 4.    return to see me in about 3 months for the next Botox injection, or sooner if she has new or worsening neurologic symptoms.    Richard A. Felecia Shelling, MD, PhD 1/44/8185, 6:31 PM Certified in Neurology, Clinical Neurophysiology, Sleep Medicine, Pain Medicine and Neuroimaging  [facility] [address] [place], [place] [address] [phone]

## 2018-05-02 NOTE — Telephone Encounter (Signed)
Spoke with Jeryn and gave appt. 1430 this afternoon/fim

## 2018-05-02 NOTE — Telephone Encounter (Signed)
Pt has called to attend to family member getting out of hospital on tomorrow afternoon and she'd like to know if Dr Felecia Shelling could possibly do her botox sometime this afternoon or 1st thing tomorrow morning.  Pt is asking for a call because she does not want to miss appointment.  Please call

## 2018-05-03 ENCOUNTER — Ambulatory Visit: Payer: BC Managed Care – PPO | Admitting: Neurology

## 2018-05-23 DIAGNOSIS — R079 Chest pain, unspecified: Secondary | ICD-10-CM | POA: Insufficient documentation

## 2018-06-02 ENCOUNTER — Other Ambulatory Visit: Payer: Self-pay | Admitting: Neurology

## 2018-06-08 DIAGNOSIS — E119 Type 2 diabetes mellitus without complications: Secondary | ICD-10-CM

## 2018-06-08 HISTORY — DX: Type 2 diabetes mellitus without complications: E11.9

## 2018-07-06 ENCOUNTER — Other Ambulatory Visit: Payer: Self-pay

## 2018-07-06 ENCOUNTER — Ambulatory Visit (INDEPENDENT_AMBULATORY_CARE_PROVIDER_SITE_OTHER): Payer: BC Managed Care – PPO

## 2018-07-06 ENCOUNTER — Telehealth: Payer: Self-pay | Admitting: Neurology

## 2018-07-06 ENCOUNTER — Ambulatory Visit (INDEPENDENT_AMBULATORY_CARE_PROVIDER_SITE_OTHER): Payer: BC Managed Care – PPO | Admitting: Neurology

## 2018-07-06 ENCOUNTER — Encounter: Payer: Self-pay | Admitting: Neurology

## 2018-07-06 VITALS — BP 138/87 | HR 69 | Resp 20 | Ht 66.0 in | Wt 282.0 lb

## 2018-07-06 DIAGNOSIS — R29898 Other symptoms and signs involving the musculoskeletal system: Secondary | ICD-10-CM | POA: Diagnosis not present

## 2018-07-06 DIAGNOSIS — IMO0002 Reserved for concepts with insufficient information to code with codable children: Secondary | ICD-10-CM

## 2018-07-06 DIAGNOSIS — M47816 Spondylosis without myelopathy or radiculopathy, lumbar region: Secondary | ICD-10-CM | POA: Diagnosis not present

## 2018-07-06 DIAGNOSIS — M545 Low back pain, unspecified: Secondary | ICD-10-CM

## 2018-07-06 DIAGNOSIS — G43709 Chronic migraine without aura, not intractable, without status migrainosus: Secondary | ICD-10-CM | POA: Diagnosis not present

## 2018-07-06 DIAGNOSIS — G4733 Obstructive sleep apnea (adult) (pediatric): Secondary | ICD-10-CM

## 2018-07-06 MED ORDER — METHYLPREDNISOLONE 4 MG PO TABS
ORAL_TABLET | ORAL | 0 refills | Status: DC
Start: 2018-07-06 — End: 2018-08-04

## 2018-07-06 NOTE — Telephone Encounter (Signed)
MR Lumbar spine wo contrast Dr. Cheree Ditto Auth: [id number] (exp. 07/06/18 to 08/04/18). Patient is scheduled for her MRI for 07/06/18 at [facility].
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
## 2018-07-11 ENCOUNTER — Telehealth: Payer: Self-pay | Admitting: *Deleted

## 2018-07-11 ENCOUNTER — Other Ambulatory Visit: Payer: Self-pay | Admitting: *Deleted

## 2018-07-11 DIAGNOSIS — M47816 Spondylosis without myelopathy or radiculopathy, lumbar region: Secondary | ICD-10-CM

## 2018-07-11 DIAGNOSIS — M5416 Radiculopathy, lumbar region: Secondary | ICD-10-CM

## 2018-07-11 NOTE — Telephone Encounter (Signed)
Results sent via My Chart/fim

## 2018-07-11 NOTE — Telephone Encounter (Signed)
-----   Message from Britt Bottom, MD sent at 07/08/2018 12:42 PM EDT ----- Please let her know the MRI does shows severe arthritic changes at L5-S1 mor eto the left and that may be radiating some to the buttock.   Moderate arthrititc changes at L4L5.   No nerve root compression though  If she does not improve any, we could consider sending her for medial branch blocks/RFA

## 2018-07-18 ENCOUNTER — Other Ambulatory Visit: Payer: Self-pay | Admitting: *Deleted

## 2018-07-18 DIAGNOSIS — M5416 Radiculopathy, lumbar region: Secondary | ICD-10-CM

## 2018-07-18 DIAGNOSIS — M47816 Spondylosis without myelopathy or radiculopathy, lumbar region: Secondary | ICD-10-CM

## 2018-07-20 ENCOUNTER — Other Ambulatory Visit: Payer: Self-pay | Admitting: Neurology

## 2018-07-20 DIAGNOSIS — M5442 Lumbago with sciatica, left side: Secondary | ICD-10-CM

## 2018-07-20 DIAGNOSIS — M47816 Spondylosis without myelopathy or radiculopathy, lumbar region: Secondary | ICD-10-CM

## 2018-07-21 ENCOUNTER — Telehealth: Payer: Self-pay | Admitting: Neurology

## 2018-07-21 ENCOUNTER — Other Ambulatory Visit: Payer: Self-pay | Admitting: *Deleted

## 2018-07-21 DIAGNOSIS — M5442 Lumbago with sciatica, left side: Secondary | ICD-10-CM

## 2018-07-21 NOTE — Progress Notes (Signed)
dg 

## 2018-07-21 NOTE — Telephone Encounter (Signed)
Please call Roberta/GI @ [phone]

## 2018-07-21 NOTE — Telephone Encounter (Signed)
Spoke with Pamela Murphy and she walked me thru putting orders in for the facet joint injections/fim

## 2018-07-21 NOTE — Telephone Encounter (Signed)
LMOM for Roberta to call back/fim

## 2018-08-02 ENCOUNTER — Ambulatory Visit: Payer: BC Managed Care – PPO | Admitting: Neurology

## 2018-08-04 ENCOUNTER — Encounter: Payer: Self-pay | Admitting: Neurology

## 2018-08-04 ENCOUNTER — Ambulatory Visit (INDEPENDENT_AMBULATORY_CARE_PROVIDER_SITE_OTHER): Payer: BC Managed Care – PPO | Admitting: Neurology

## 2018-08-04 ENCOUNTER — Other Ambulatory Visit: Payer: Self-pay

## 2018-08-04 VITALS — BP 128/79 | HR 69 | Resp 18 | Ht 66.0 in | Wt 280.5 lb

## 2018-08-04 DIAGNOSIS — M5442 Lumbago with sciatica, left side: Secondary | ICD-10-CM

## 2018-08-04 DIAGNOSIS — G43709 Chronic migraine without aura, not intractable, without status migrainosus: Secondary | ICD-10-CM | POA: Diagnosis not present

## 2018-08-04 DIAGNOSIS — G4733 Obstructive sleep apnea (adult) (pediatric): Secondary | ICD-10-CM

## 2018-08-04 DIAGNOSIS — M47816 Spondylosis without myelopathy or radiculopathy, lumbar region: Secondary | ICD-10-CM

## 2018-08-04 DIAGNOSIS — IMO0002 Reserved for concepts with insufficient information to code with codable children: Secondary | ICD-10-CM

## 2018-08-04 MED ORDER — OXYCODONE-ACETAMINOPHEN 10-325 MG PO TABS: 1.0000 | ORAL_TABLET | Freq: Three times a day (TID) | ORAL | 0 refills | Status: DC | PRN

## 2018-08-04 MED ORDER — ONDANSETRON 8 MG PO TBDP: 8.0000 mg | ORAL_TABLET | Freq: Three times a day (TID) | ORAL | 2 refills | Status: DC | PRN

## 2018-08-04 NOTE — Progress Notes (Signed)
[facility]  PATIENT: Pamela Murphy DOB: 1971-05-14 REASON FOR VISIT: Chronic Migraine headaches   HISTORICAL  CHIEF COMPLAINT:  Chief Complaint  Patient presents with  . Migraines    Botox 100u 2 vials, [facility]. Lot# [id number] Exp. 03/22. NDC 0023-1145-01/fim    HISTORY OF PRESENT ILLNESS:  Pamela Murphy is a 47 y.o. woman with a chronic migraine disorder, obstructive sleep apnea and abnormal MRI of the brain.   Update 08/04/2018: Her chronic migraine is generally doing well with Botox.  Is tolerating the injections well.  She has now been on therapy since 2009.  She still gets occasional headaches that can be severe, especially when she travels.  When the migraine occurs she gets throbbing pain associated with nausea, vomiting and photophobia.  Moving worsens the pain.  Self-administered IM Toradol shots have been the most beneficial.  She is noting a lot of back pain.  She has left greater than right facet hypertrophy that is severe at L5-S1 and moderate at L4-L5.  She is scheduled to have a facet injection next week.  We discussed that if she gets a benefit from that then we could consider a medial branch blocks/RFA based on results  IMPRESSION: This MRI of the lumbar spine without contrast multilevel degenerative changes as detailed above.  Most significant findings are:: 1.    At L4-L5, there is moderate facet hypertrophy and a small joint effusion on the right, and left ligamentunm flavum hypertrophy.  There is no nerve root compression. 2.    At L5-S1, there is severe facet hypertrophy worse on the left and small joint effusion on the left.  There is no nerve root compression  She has obstructive sleep apnea and uses CPAP regularly.  She is tolerating it well.   Update 05/02/2018: Migraines continue to do fairly well on Botox.  She has tolerated the injections well.  She has been on the therapy for 9 to 10 years.  Headaches are most likely to occur when she  travels.  Migraine pain is throbbing and is associated with nausea, photophobia and phonophobia.  Moving will worsen the pain.  When a more severe migraine occurs, she often takes self-administered IM Toradol with benefit.  She is on CPAP nightly for obstructive sleep apnea with benefit.  With CPAP she generally feels more alert and less tired and sleepy the next day.  Update 01/27/2018: She feels her migraines are about the same.    Botox has helped a lot.   She has been on it for about 9 years.   She still notes that headaches are worse with barometric changes and traveling.    For breakthrough migraines at a more severe she will sometimes do Toradol shots with benefit. When present, pain is throbbing and associated with photophobia, phonophobia and nausea.  Her OSA does well. She uses CPAP nightly. She feels better the next morning and day when she uses CPAP at night. She has lost some 10-12 pounds since the last visit and she feels a little better.  Her lower back pain is a little bit better with the weight loss.    Update 10/27/17:  She got her new machine through [facility].   She notes it seems to be working and she is compliant (AHI = 0.7 and compliance is 100%).    However, she feels the pressure is much lower than the other machine.    We discussed switching to Auto-PAP.      Her chronic migraines  are doing well.   She did have a lot of migraines the first 2 -3 weeks of the cycle but then had very few the past 2- 2 1/2 months.  When present, headaches are associated with nausea, photophobia and phonophobia. Moving will make the headaches worse She notes some neck pain.  From 07/22/2017:     Chronic Migraine/neck pain:    Her headaches continued to do well with Botox therapy. She's been on Botox since 2010 which has led to a great reduction in her headache frequency from 29-30/30 a month to just a couple of months.      When she has a migraine, pain is usually worse on the right than the left.  Headaches can be triggered by flying. When present, pain is throbbing and is associated with photophobia and phonophobia. With Botox, she has fewer headaches, especially the first 2 months of each cycle.      When she has a more severe migraine she takes Toradol injections or Maxalt pills. She has tolerated Botox very well and has not had any complications.   She is more likely to get as severe migraine during the last couple weeks of each cycle.  Migraine history:  She has a long history of chronic migraines that were occurring almost every day (25-28 days/month) for 4 or more hours a day. Multiple medications were tried for prophylactic and abortive therapies with suboptimal response.    In the past, she has failed multiple medications including:  Antiepileptics (Topamax, Keppra, zonisamide), anti-depressants (Cymbalta, Prozac), multiple NSAID's, betas blockers (Inderal, atenolol), muscle relaxants (soma, tizanidine, cyclobenzaprine) in various combinations.   Once the headache occurs, she has tried triptan's with incomplete benefit much of the time. She has also tried opiates.   IM Toradol has been most effective when one occurs.  Neck pain:  She gets a migraine she will have neck pain and she also notes neck pain at times. Botox has helped the neck pain as well. She has no pain radiating into the arms. MRI on 09/12/2014 showed mild degenerative changes at C5-C6 and C6-C7 with no definite nerve root compression.       OSA:  She continues to use her CPAP on a nightly basis. She has trouble sleeping without CPAP and she takes it with her when she travels most of the time. She has had some issues with the machine and it is time for her to get a new one.   She feels less fatigue when she uses her CPAP the next day.  She deneis much dayitme hypersomnia.   She tolerates it well.    Abnormal MRI/?MS:  She was diagnosed with MS in the past and was on interferon therapy for several years.   I "undiagnosed her"  around 2010 and she got a second opinion from Dr. Terie Purser who also concurred.  MRI shows small round foci predominantly in the subcortical and deep white matter in a nonspecific manner.  Her last MRI of the brain was in 2016 and was stable compared to the previous ones.  She had had paresthesias and fatigue in the past that contributed to her diagnosis.   We have discussed that she has been stable times many years or therapy that MS is significantly less likely.   REVIEW OF SYSTEMS:  Constitutional: No fevers, chills, sweats, or change in appetite Eyes: No visual changes, double vision, eye pain Ear, nose and throat: No hearing loss, ear pain, nasal congestion, sore throat Cardiovascular: No chest  pain, palpitations Respiratory:  see above GastrointestinaI: No nausea, vomiting, diarrhea, abdominal pain, fecal incontinence Genitourinary:  No dysuria, urinary retention or frequency.  No nocturia.   Dysfunctional uterine bleeding Musculoskeletal:  notes low back pain that has been worse the last couple months and neck pain that is helped by Botox Integumentary: No rash, pruritus, skin lesions Neurological: as above Psychiatric: No depression at this time.  No anxiety Endocrine: No palpitations, diaphoresis, change in appetite, change in weigh or increased thirst Hematologic/Lymphatic:   She has anemia due to dysfunctional uterine bleeding. Allergic/Immunologic: No itchy/runny eyes, nasal congestion, recent allergic reactions, rashes  ALLERGIES: Allergies  Allergen Reactions  . Cefaclor Rash    Other Reaction: neck swelling  . Prochlorperazine Maleate Rash    Other Reaction: jittery;tachycardia  . Sulfamethoxazole-Trimethoprim Nausea Only  . Tessalon Perles [Benzonatate] Rash    HOME MEDICATIONS: Outpatient Medications Prior to Visit  Medication Sig Dispense Refill  . atenolol (TENORMIN) 25 MG tablet TAKE 1 TABLET BY MOUTH EVERY DAY 90 tablet 3  . B-D 3CC LUER-LOK SYR 23GX1" 23G X 1" 3  ML MISC Use as needed with 1 1/2 inch needle (also dispense) 18 each 5  . Botulinum Toxin Type A (BOTOX) 200 UNITS SOLR MD to inject every 3 mos. for migraine headaches. 1 each 3  . ketorolac (TORADOL) 60 MG/2ML SOLN injection Inject up to 2 mL (60 mg) prn headache.  No more than 2 mL/day or 8 mL/month 8 mL 5  . meloxicam (MOBIC) 15 MG tablet TAKE 1 TABLET BY MOUTH EVERY DAY 30 tablet 1  . metFORMIN (GLUCOPHAGE-XR) 500 MG 24 hr tablet TAKE 1 TABLET BY MOUTH EVERY DAY WITH BREAKFAST  0  . Multiple Vitamins-Minerals (MULTIVITAMIN WITH MINERALS) tablet Take by mouth.    Marland Kitchen NEEDLE, DISP, 25 G 25G X 1-1/2" MISC     . rizatriptan (MAXALT-MLT) 10 MG disintegrating tablet TAKE 1 TABLET BY MOUTH AS NEEDED FOR MIGRAINE. MAY REPEAT IN 2 HOURS IF NEEDED 12 tablet 9  . tiZANidine (ZANAFLEX) 4 MG tablet TAKE 1 TABLET THREE TIMES A DAY 90 tablet 5  . YALE DISP NEEDLES 25GX1-1/2" 25G X 1-1/2" MISC Use for IM injections as needed 18 each 3  . ondansetron (ZOFRAN-ODT) 8 MG disintegrating tablet TAKE 1 TABLET (8 MG TOTAL) BY MOUTH EVERY 8 (EIGHT) HOURS AS NEEDED FOR NAUSEA OR VOMITING. 30 tablet 2  . oxyCODONE-acetaminophen (PERCOCET) 10-325 MG tablet Take 1 tablet by mouth every 8 (eight) hours as needed for pain. 1 pill every 4-6 hours as needed for breakthrough pain as needed 90 tablet 0  . pantoprazole (PROTONIX) 40 MG tablet Take 40 mg by mouth 2 (two) times daily.     . methylPREDNISolone (MEDROL) 4 MG tablet Taper from 6 pills po for one day to 1 pill po the last day over 6 days 21 tablet 0   No facility-administered medications prior to visit.     PAST MEDICAL HISTORY: Past Medical History:  Diagnosis Date  . Headache   . Hypertension   . Vision abnormalities     PAST SURGICAL HISTORY: Past Surgical History:  Procedure Laterality Date  . SINUS IRRIGATION      FAMILY HISTORY: Family History  Problem Relation Age of Onset  . Breast cancer Mother   . Melanoma Mother   . Atrial fibrillation  Mother   . Supraventricular tachycardia Mother   . Heart attack Father   . Hypertension Father   . Hyperlipidemia Father  SOCIAL HISTORY:  Social History   Socioeconomic History  . Marital status: Single    Spouse name: Not on file  . Number of children: Not on file  . Years of education: Not on file  . Highest education level: Not on file  Occupational History  . Not on file  Social Needs  . Financial resource strain: Not on file  . Food insecurity:    Worry: Not on file    Inability: Not on file  . Transportation needs:    Medical: Not on file    Non-medical: Not on file  Tobacco Use  . Smoking status: Never Smoker  . Smokeless tobacco: Never Used  Substance and Sexual Activity  . Alcohol use: No    Alcohol/week: 0.0 standard drinks    Comment: Rare  . Drug use: No  . Sexual activity: Not on file  Lifestyle  . Physical activity:    Days per week: Not on file    Minutes per session: Not on file  . Stress: Not on file  Relationships  . Social connections:    Talks on phone: Not on file    Gets together: Not on file    Attends religious service: Not on file    Active member of club or organization: Not on file    Attends meetings of clubs or organizations: Not on file    Relationship status: Not on file  . Intimate partner violence:    Fear of current or ex partner: Not on file    Emotionally abused: Not on file    Physically abused: Not on file    Forced sexual activity: Not on file  Other Topics Concern  . Not on file  Social History Narrative  . Not on file     PHYSICAL EXAM  Vitals:   08/04/18 0857  BP: 128/79  Pulse: 69  Resp: 18  Weight: 280 lb 8 oz (127.2 kg)  Height: 5\' 6"  (1.676 m)    Body mass index is 45.27 kg/m.   General: The patient is well-developed and well-nourished and in no acute distress   Musculoskeletal: She has mild tenderness over the left lumbar paraspinal muscles in the lower back.    Neck has good range of  motion and is currently nontender.  Neurologic Exam  Mental status: The patient is alert and oriented x 3 at the time of the examination.  Speech is normal.  Cranial nerves: Extraocular movements are full.  Facial strength and sensation is normal. Trapezius strength is normal.   No obvious hearing deficits are noted.  Motor:   Muscle tone and bulk is normal. She has normal strength.    Gait and station: Station and gait are normal.  Romberg is negative. Tandem gait is normal.   DTR's: Normal in legs.    DIAGNOSTIC DATA (LABS, IMAGING, TESTING) - I reviewed patient records, labs, notes, testing and imaging myself where available.     ASSESSMENT AND PLAN  Chronic migraine  Obstructive sleep apnea  Facet hypertrophy of lumbar region  Obstructive apnea   1.    Inject 190 units Botox (10 units wasted) :   Frontalis (5 U x 4), nasalis/corrugators (5 U x 3), temporalis  (5 U x 9 - 5R, 4L), occipitalis (5 U x 6), splenius capitis (15 U x 2), trapezius (15 U x 2), C6C7 paraspinal muscles (10 U x 2), wasted (10 U) 2.    When she gets a migraine she will take Maxalt  or IM Toradol.  Percocet for breakthrough.   3.   Use CPAP nightly 4.    return to see me in about 3 months for the next Botox injection, or sooner if she has new or worsening neurologic symptoms.    Izel Eisenhardt A. Felecia Shelling, MD, PhD 1/77/1165, 7:90 PM Certified in Neurology, Clinical Neurophysiology, Sleep Medicine, Pain Medicine and Neuroimaging  [facility] [address], [place], [place] [address] [phone]

## 2018-08-06 ENCOUNTER — Other Ambulatory Visit: Payer: Self-pay | Admitting: Neurology

## 2018-08-09 ENCOUNTER — Telehealth: Payer: Self-pay | Admitting: *Deleted

## 2018-08-09 NOTE — Telephone Encounter (Signed)
Submitted PA oxycodone on covermymeds. EBV:[id number]. Waiting on determination. FC:ZGQH pain (M54.2), Lower back pain (M54.5)  "Your information has been submitted to [place]. If [employer] has not responded to your request within 24 hours, contact [place] at [phone]."

## 2018-08-10 ENCOUNTER — Ambulatory Visit
Admission: RE | Admit: 2018-08-10 | Discharge: 2018-08-10 | Disposition: A | Discharge status: Home / Self Care | Payer: BC Managed Care – PPO | Source: Ambulatory Visit | Attending: Neurology | Admitting: Neurology

## 2018-08-10 DIAGNOSIS — M5442 Lumbago with sciatica, left side: Secondary | ICD-10-CM

## 2018-08-10 MED ORDER — IOPAMIDOL (ISOVUE-M 200) INJECTION 41%
1.0000 mL | Freq: Once | INTRAMUSCULAR | Status: AC
  Administered 2018-08-10: 1 mL via INTRA_ARTICULAR

## 2018-08-10 MED ORDER — METHYLPREDNISOLONE ACETATE 40 MG/ML INJ SUSP (RADIOLOG
120.0000 mg | Freq: Once | INTRAMUSCULAR | Status: AC
  Administered 2018-08-10: 120 mg via INTRA_ARTICULAR

## 2018-08-10 NOTE — Discharge Instructions (Signed)

## 2018-08-10 NOTE — Telephone Encounter (Signed)
Pa approved effective 08/09/18-08/10/19.

## 2018-08-16 ENCOUNTER — Other Ambulatory Visit: Payer: Self-pay | Admitting: Neurology

## 2018-08-16 DIAGNOSIS — M47816 Spondylosis without myelopathy or radiculopathy, lumbar region: Secondary | ICD-10-CM

## 2018-08-16 DIAGNOSIS — M5416 Radiculopathy, lumbar region: Secondary | ICD-10-CM

## 2018-08-21 ENCOUNTER — Other Ambulatory Visit: Payer: Self-pay | Admitting: Neurology

## 2018-08-29 ENCOUNTER — Other Ambulatory Visit: Payer: Self-pay | Admitting: Neurology

## 2018-08-29 ENCOUNTER — Ambulatory Visit
Admission: RE | Admit: 2018-08-29 | Discharge: 2018-08-29 | Disposition: A | Discharge status: Home / Self Care | Payer: BC Managed Care – PPO | Source: Ambulatory Visit | Attending: Neurology | Admitting: Neurology

## 2018-08-29 DIAGNOSIS — M47816 Spondylosis without myelopathy or radiculopathy, lumbar region: Secondary | ICD-10-CM

## 2018-08-29 DIAGNOSIS — M5416 Radiculopathy, lumbar region: Secondary | ICD-10-CM

## 2018-09-01 ENCOUNTER — Other Ambulatory Visit: Payer: Self-pay | Admitting: Neurology

## 2018-09-01 ENCOUNTER — Ambulatory Visit
Admission: RE | Admit: 2018-09-01 | Discharge: 2018-09-01 | Disposition: A | Discharge status: Home / Self Care | Payer: BC Managed Care – PPO | Source: Ambulatory Visit | Attending: Neurology | Admitting: Neurology

## 2018-09-01 DIAGNOSIS — M5416 Radiculopathy, lumbar region: Secondary | ICD-10-CM

## 2018-09-01 DIAGNOSIS — M47816 Spondylosis without myelopathy or radiculopathy, lumbar region: Secondary | ICD-10-CM

## 2018-09-01 MED ORDER — SODIUM CHLORIDE 0.9 % IV SOLN
Freq: Once | INTRAVENOUS | Status: AC
  Administered 2018-09-01: 12:00:00 via INTRAVENOUS

## 2018-09-01 MED ORDER — MIDAZOLAM HCL 2 MG/2ML IJ SOLN
1.0000 mg | INTRAMUSCULAR | Status: DC | PRN
  Administered 2018-09-01: 2 mg via INTRAVENOUS

## 2018-09-01 MED ORDER — KETOROLAC TROMETHAMINE 30 MG/ML IJ SOLN
30.0000 mg | Freq: Once | INTRAMUSCULAR | Status: AC
  Administered 2018-09-01: 30 mg via INTRAVENOUS

## 2018-09-01 MED ORDER — FENTANYL CITRATE (PF) 100 MCG/2ML IJ SOLN
25.0000 ug | INTRAMUSCULAR | Status: DC | PRN
  Administered 2018-09-01: 50 ug via INTRAVENOUS

## 2018-09-01 NOTE — Discharge Instructions (Signed)
Radio Frequency Ablation Post Procedure Discharge Instructions ° °1. May resume a regular diet and any medications that you routinely take (including pain medications). °2. No driving day of procedure. °3. Upon discharge go home and rest for at least 4 hours.  May use an ice pack as needed to injection sites on back. °4. Remove bandades later, today. ° ° ° °Please contact our office at [phone] for the following symptoms: ° °· Fever greater than 100 degrees °· Increased swelling, pain, or redness at injection site. ° ° °Thank you for visiting [facility]. °

## 2018-09-01 NOTE — Progress Notes (Signed)
Procedure is complete. Pt tolerated procedure well. Pt currently awake and speaking in complete sentences. Pt currently resting in nurses station.

## 2018-09-22 ENCOUNTER — Telehealth: Payer: Self-pay | Admitting: Neurology

## 2018-09-22 NOTE — Telephone Encounter (Signed)
Per Dr. Felecia Shelling- he is going to use Depo-medrol and marcaine for nerve block.

## 2018-09-22 NOTE — Telephone Encounter (Signed)
I called patient back. She is having severe headache. She is having pain at base of head. Rain is a trigger for her headahces/migraines. She took two maxalt last night, percocet this am and nothing has helped. She is requesting to come in for nerve block today. Advised I will need to speak with Dr. Felecia Shelling and call her back. She verbalized understanding.

## 2018-09-22 NOTE — Telephone Encounter (Signed)
Called, LVM for pt to call office. 

## 2018-09-22 NOTE — Telephone Encounter (Signed)
Pt returning RN's call.

## 2018-09-22 NOTE — Telephone Encounter (Signed)
I called patient back. Advised we can work her in tomorrow morning for nerve block. She wanted to know what she should do in the meantime. She took maxalt last at 10am and percocet. Asked if she tried taking tizanidine that is prescribed. She states she only takes at night. I advised she can take a dose if she is at home. She states she ran to drug store and we be going home. She will try taking tizanidine and percocet when next due. Scheduled her for appt tomorrow, check in 9am. I placed her on schedule. She will call back if HA improves and does not need appt tomorrow.

## 2018-09-22 NOTE — Telephone Encounter (Signed)
Per Dr. Felecia Shelling- can work pt in tomorrow morning for nerve block.

## 2018-09-22 NOTE — Telephone Encounter (Signed)
Pt has called to inform that rain is a trigger for her sever migraines, pt would like to know if she could be squeezed in for a nerve block today.  Please call

## 2018-09-23 ENCOUNTER — Ambulatory Visit: Payer: Self-pay | Admitting: Neurology

## 2018-10-06 ENCOUNTER — Other Ambulatory Visit: Payer: Self-pay | Admitting: *Deleted

## 2018-10-06 MED ORDER — MELOXICAM 15 MG PO TABS: 15.0000 mg | ORAL_TABLET | Freq: Every day | ORAL | 1 refills | Status: DC

## 2018-10-10 ENCOUNTER — Telehealth: Payer: Self-pay | Admitting: Neurology

## 2018-10-10 ENCOUNTER — Ambulatory Visit (INDEPENDENT_AMBULATORY_CARE_PROVIDER_SITE_OTHER): Payer: BC Managed Care – PPO | Admitting: Neurology

## 2018-10-10 ENCOUNTER — Encounter: Payer: Self-pay | Admitting: Neurology

## 2018-10-10 VITALS — BP 126/79 | HR 71 | Ht 66.0 in | Wt 282.5 lb

## 2018-10-10 DIAGNOSIS — M542 Cervicalgia: Secondary | ICD-10-CM

## 2018-10-10 DIAGNOSIS — G43709 Chronic migraine without aura, not intractable, without status migrainosus: Secondary | ICD-10-CM | POA: Diagnosis not present

## 2018-10-10 DIAGNOSIS — M47816 Spondylosis without myelopathy or radiculopathy, lumbar region: Secondary | ICD-10-CM

## 2018-10-10 DIAGNOSIS — M5442 Lumbago with sciatica, left side: Secondary | ICD-10-CM

## 2018-10-10 DIAGNOSIS — IMO0002 Reserved for concepts with insufficient information to code with codable children: Secondary | ICD-10-CM

## 2018-10-10 NOTE — Telephone Encounter (Signed)
Patient states has had a migraine since last Friday. She would like to discuss getting a nerve block.

## 2018-10-10 NOTE — Progress Notes (Signed)
[facility]  PATIENT: Pamela Murphy DOB: 1971-01-24 REASON FOR VISIT: Chronic Migraine headaches   HISTORICAL  CHIEF COMPLAINT:  Chief Complaint  Patient presents with  . Follow-up    RM 12, alone. Last seen 08/04/18. Here for nerve block for migraine.     HISTORY OF PRESENT ILLNESS:  Pamela Murphy is a 47 y.o. woman with a chronic migraine disorder, now experiencing a severe occipital headache.     Update 10/10/2018: She has had a severe occipital headache bilaterally for the past 2 weeks.  She notes that this headache is very similar to ones that she has experienced in the past and improved with trigger point injections/occipital nerve blocks.  Pain is occipital and radiates towards the forehead.  Pain is worse with movements, especially sudden jarring movement.  In general, migraines have done much better since being on Botox therapy but she notes that a few times a year she will get the severe occipital headaches..  She has had a radiofrequency ablation for her lumbar facet hypertrophy.  Lower back pain is much better.  She tolerated the procedures well.  Update 08/04/2018: Her chronic migraine is generally doing well with Botox.  Is tolerating the injections well.  She has now been on therapy since 2009.  She still gets occasional headaches that can be severe, especially when she travels.  When the migraine occurs she gets throbbing pain associated with nausea, vomiting and photophobia.  Moving worsens the pain.  Self-administered IM Toradol shots have been the most beneficial.  She is noting a lot of back pain.  She has left greater than right facet hypertrophy that is severe at L5-S1 and moderate at L4-L5.  She is scheduled to have a facet injection next week.  We discussed that if she gets a benefit from that then we could consider a medial branch blocks/RFA based on results  IMPRESSION: This MRI of the lumbar spine without contrast multilevel degenerative  changes as detailed above.  Most significant findings are:: 1.    At L4-L5, there is moderate facet hypertrophy and a small joint effusion on the right, and left ligamentunm flavum hypertrophy.  There is no nerve root compression. 2.    At L5-S1, there is severe facet hypertrophy worse on the left and small joint effusion on the left.  There is no nerve root compression  She has obstructive sleep apnea and uses CPAP regularly.  She is tolerating it well.   Update 05/02/2018: Migraines continue to do fairly well on Botox.  She has tolerated the injections well.  She has been on the therapy for 9 to 10 years.  Headaches are most likely to occur when she travels.  Migraine pain is throbbing and is associated with nausea, photophobia and phonophobia.  Moving will worsen the pain.  When a more severe migraine occurs, she often takes self-administered IM Toradol with benefit.  She is on CPAP nightly for obstructive sleep apnea with benefit.  With CPAP she generally feels more alert and less tired and sleepy the next day.  Update 01/27/2018: She feels her migraines are about the same.    Botox has helped a lot.   She has been on it for about 9 years.   She still notes that headaches are worse with barometric changes and traveling.    For breakthrough migraines at a more severe she will sometimes do Toradol shots with benefit. When present, pain is throbbing and associated with photophobia, phonophobia and  nausea.  Her OSA does well. She uses CPAP nightly. She feels better the next morning and day when she uses CPAP at night. She has lost some 10-12 pounds since the last visit and she feels a little better.  Her lower back pain is a little bit better with the weight loss.    Update 10/27/17:  She got her new machine through [facility].   She notes it seems to be working and she is compliant (AHI = 0.7 and compliance is 100%).    However, she feels the pressure is much lower than the other machine.    We  discussed switching to Auto-PAP.      Her chronic migraines are doing well.   She did have a lot of migraines the first 2 -3 weeks of the cycle but then had very few the past 2- 2 1/2 months.  When present, headaches are associated with nausea, photophobia and phonophobia. Moving will make the headaches worse She notes some neck pain.  From 07/22/2017:     Chronic Migraine/neck pain:    Her headaches continued to do well with Botox therapy. She's been on Botox since 2010 which has led to a great reduction in her headache frequency from 29-30/30 a month to just a couple of months.      When she has a migraine, pain is usually worse on the right than the left. Headaches can be triggered by flying. When present, pain is throbbing and is associated with photophobia and phonophobia. With Botox, she has fewer headaches, especially the first 2 months of each cycle.      When she has a more severe migraine she takes Toradol injections or Maxalt pills. She has tolerated Botox very well and has not had any complications.   She is more likely to get as severe migraine during the last couple weeks of each cycle.  Migraine history:  She has a long history of chronic migraines that were occurring almost every day (25-28 days/month) for 4 or more hours a day. Multiple medications were tried for prophylactic and abortive therapies with suboptimal response.    In the past, she has failed multiple medications including:  Antiepileptics (Topamax, Keppra, zonisamide), anti-depressants (Cymbalta, Prozac), multiple NSAID's, betas blockers (Inderal, atenolol), muscle relaxants (soma, tizanidine, cyclobenzaprine) in various combinations.   Once the headache occurs, she has tried triptan's with incomplete benefit much of the time. She has also tried opiates.   IM Toradol has been most effective when one occurs.  Neck pain:  She gets a migraine she will have neck pain and she also notes neck pain at times. Botox has helped the neck  pain as well. She has no pain radiating into the arms. MRI on 09/12/2014 showed mild degenerative changes at C5-C6 and C6-C7 with no definite nerve root compression.       OSA:  She continues to use her CPAP on a nightly basis. She has trouble sleeping without CPAP and she takes it with her when she travels most of the time. She has had some issues with the machine and it is time for her to get a new one.   She feels less fatigue when she uses her CPAP the next day.  She deneis much dayitme hypersomnia.   She tolerates it well.    Abnormal MRI/?MS:  She was diagnosed with MS in the past and was on interferon therapy for several years.   I "undiagnosed her" around 2010 and she got a second  opinion from Dr. Terie Purser who also concurred.  MRI shows small round foci predominantly in the subcortical and deep white matter in a nonspecific manner.  Her last MRI of the brain was in 2016 and was stable compared to the previous ones.  She had had paresthesias and fatigue in the past that contributed to her diagnosis.   We have discussed that she has been stable times many years or therapy that MS is significantly less likely.   REVIEW OF SYSTEMS:  Constitutional: No fevers, chills, sweats, or change in appetite Eyes: No visual changes, double vision, eye pain Ear, nose and throat: No hearing loss, ear pain, nasal congestion, sore throat Cardiovascular: No chest pain, palpitations Respiratory:  see above GastrointestinaI: No nausea, vomiting, diarrhea, abdominal pain, fecal incontinence Genitourinary:  No dysuria, urinary retention or frequency.  No nocturia.   Dysfunctional uterine bleeding Musculoskeletal:  notes low back pain that has been worse the last couple months and neck pain that is helped by Botox Integumentary: No rash, pruritus, skin lesions Neurological: as above Psychiatric: No depression at this time.  No anxiety Endocrine: No palpitations, diaphoresis, change in appetite, change in weigh or  increased thirst Hematologic/Lymphatic:   She has anemia due to dysfunctional uterine bleeding. Allergic/Immunologic: No itchy/runny eyes, nasal congestion, recent allergic reactions, rashes  ALLERGIES: Allergies  Allergen Reactions  . Cefaclor Swelling and Rash     neck swelling  . Prochlorperazine Maleate Rash and Other (See Comments)     jittery;tachycardia  . Bupropion Anxiety  . Sulfamethoxazole-Trimethoprim Nausea Only  . Tessalon Perles [Benzonatate] Rash    HOME MEDICATIONS: Outpatient Medications Prior to Visit  Medication Sig Dispense Refill  . atenolol (TENORMIN) 25 MG tablet TAKE 1 TABLET BY MOUTH EVERY DAY 90 tablet 3  . B-D 3CC LUER-LOK SYR 23GX1" 23G X 1" 3 ML MISC Use as needed with 1 1/2 inch needle (also dispense) 18 each 5  . Botulinum Toxin Type A (BOTOX) 200 UNITS SOLR MD to inject every 3 mos. for migraine headaches. 1 each 3  . ketorolac (TORADOL) 60 MG/2ML SOLN injection Inject up to 2 mL (60 mg) prn headache.  No more than 2 mL/day or 8 mL/month 8 mL 5  . meloxicam (MOBIC) 15 MG tablet Take 1 tablet (15 mg total) by mouth daily. 30 tablet 1  . metFORMIN (GLUCOPHAGE-XR) 500 MG 24 hr tablet TAKE 1 TABLET BY MOUTH EVERY DAY WITH BREAKFAST  0  . Multiple Vitamins-Minerals (MULTIVITAMIN WITH MINERALS) tablet Take by mouth.    Marland Kitchen NEEDLE, DISP, 25 G 25G X 1-1/2" MISC     . ondansetron (ZOFRAN-ODT) 8 MG disintegrating tablet Take 1 tablet (8 mg total) by mouth every 8 (eight) hours as needed for nausea or vomiting. 30 tablet 2  . oxyCODONE-acetaminophen (PERCOCET) 10-325 MG tablet Take 1 tablet by mouth every 8 (eight) hours as needed for pain. 1 pill every 4-6 hours as needed for breakthrough pain as needed 90 tablet 0  . rizatriptan (MAXALT-MLT) 10 MG disintegrating tablet TAKE 1 TABLET BY MOUTH AS NEEDED FOR MIGRAINE. MAY REPEAT IN 2 HOURS IF NEEDED 12 tablet 9  . tiZANidine (ZANAFLEX) 4 MG tablet TAKE 1 TABLET BY MOUTH THREE TIMES A DAY 90 tablet 5  . YALE DISP  NEEDLES 25GX1-1/2" 25G X 1-1/2" MISC Use for IM injections as needed 18 each 3  . pantoprazole (PROTONIX) 40 MG tablet Take 40 mg by mouth 2 (two) times daily.      No facility-administered medications prior  to visit.     PAST MEDICAL HISTORY: Past Medical History:  Diagnosis Date  . Headache   . Hypertension   . Vision abnormalities     PAST SURGICAL HISTORY: Past Surgical History:  Procedure Laterality Date  . SINUS IRRIGATION      FAMILY HISTORY: Family History  Problem Relation Age of Onset  . Breast cancer Mother   . Melanoma Mother   . Atrial fibrillation Mother   . Supraventricular tachycardia Mother   . Heart attack Father   . Hypertension Father   . Hyperlipidemia Father     SOCIAL HISTORY:  Social History   Socioeconomic History  . Marital status: Single    Spouse name: Not on file  . Number of children: Not on file  . Years of education: Not on file  . Highest education level: Not on file  Occupational History  . Not on file  Social Needs  . Financial resource strain: Not on file  . Food insecurity:    Worry: Not on file    Inability: Not on file  . Transportation needs:    Medical: Not on file    Non-medical: Not on file  Tobacco Use  . Smoking status: Never Smoker  . Smokeless tobacco: Never Used  Substance and Sexual Activity  . Alcohol use: No    Alcohol/week: 0.0 standard drinks    Comment: Rare  . Drug use: No  . Sexual activity: Not on file  Lifestyle  . Physical activity:    Days per week: Not on file    Minutes per session: Not on file  . Stress: Not on file  Relationships  . Social connections:    Talks on phone: Not on file    Gets together: Not on file    Attends religious service: Not on file    Active member of club or organization: Not on file    Attends meetings of clubs or organizations: Not on file    Relationship status: Not on file  . Intimate partner violence:    Fear of current or ex partner: Not on file     Emotionally abused: Not on file    Physically abused: Not on file    Forced sexual activity: Not on file  Other Topics Concern  . Not on file  Social History Narrative  . Not on file     PHYSICAL EXAM  Vitals:   10/10/18 1505  BP: 126/79  Pulse: 71  Weight: 282 lb 8 oz (128.1 kg)  Height: 5\' 6"  (1.676 m)    Body mass index is 45.6 kg/m.   General: The patient is well-developed and well-nourished and in no acute distress   Musculoskeletal: There is tenderness over the splenius capitis and upper cervical paraspinal muscles bilaterally.  Slightly reduced range of motion in the neck.  Neurologic Exam  Mental status: The patient is alert and oriented x 3 at the time of the examination.  Speech is normal.  Cranial nerves: Extraocular movements are full.  Facial strength is normal. Trapezius strength is normal.   No obvious hearing deficits are noted.  Motor:   Muscle tone and bulk is normal. She has normal strength.    Gait and station: Station and gait are normal.         DIAGNOSTIC DATA (LABS, IMAGING, TESTING) - I reviewed patient records, labs, notes, testing and imaging myself where available.     ASSESSMENT AND PLAN  Neck pain  Chronic migraine  Facet hypertrophy of lumbar region  Acute bilateral low back pain with left-sided sciatica   1.    Trigger point injection 80 mg Depo-Medrol in 3 cc Marcaine into the splenius capitis muscles bilaterally using sterile technique.  She tolerated the injection well and there were no complications. 2.    She will continue Botox for chronic migraine.  When she gets a migraine she will take Maxalt or IM Toradol.  Percocet for breakthrough.   3.   Use CPAP nightly 4.    return to see me in for the next Botox injection, or sooner if she has new or worsening neurologic symptoms.    Terrell Ostrand A. Felecia Shelling, MD, PhD 34/35/6861, 6:83 PM Certified in Neurology, Clinical Neurophysiology, Sleep Medicine, Pain Medicine and  Neuroimaging  [facility] [address] [place],  [address] [phone]

## 2018-10-10 NOTE — Telephone Encounter (Signed)
Called pt. Scheduled her for today at 3pm with Dr. Felecia Shelling for nerve block-work in appt.

## 2018-11-09 ENCOUNTER — Ambulatory Visit (INDEPENDENT_AMBULATORY_CARE_PROVIDER_SITE_OTHER): Payer: BC Managed Care – PPO | Admitting: Neurology

## 2018-11-09 ENCOUNTER — Encounter: Payer: Self-pay | Admitting: Neurology

## 2018-11-09 VITALS — BP 132/87 | HR 65 | Ht 66.0 in | Wt 280.5 lb

## 2018-11-09 DIAGNOSIS — G43019 Migraine without aura, intractable, without status migrainosus: Secondary | ICD-10-CM

## 2018-11-09 DIAGNOSIS — IMO0002 Reserved for concepts with insufficient information to code with codable children: Secondary | ICD-10-CM

## 2018-11-09 DIAGNOSIS — M542 Cervicalgia: Secondary | ICD-10-CM | POA: Diagnosis not present

## 2018-11-09 DIAGNOSIS — G4733 Obstructive sleep apnea (adult) (pediatric): Secondary | ICD-10-CM

## 2018-11-09 DIAGNOSIS — G43709 Chronic migraine without aura, not intractable, without status migrainosus: Secondary | ICD-10-CM

## 2018-11-09 NOTE — Progress Notes (Signed)
[facility]  PATIENT: Pamela Murphy DOB: [birth year] REASON FOR VISIT: Chronic Migraine headaches   HISTORICAL  CHIEF COMPLAINT:  Chief Complaint  Patient presents with  . Follow-up    RM 13, alone.   Here for work in nerve block for migraines.     HISTORY OF PRESENT ILLNESS:  Pamela Murphy is a 47 y.o. woman with a chronic migraine disorder, now experiencing a severe occipital headache.     Update 11/09/2018: Since 11/04/2018, she has had a severe headache with neck pain, bilaterally.   Usually, tizanidine and/or IM Toradol helps but this time it did not.   In the past, occipital nerve blocks/TPI has helped.   Pain worsens with movements.     Her migraines are doing ok.   She is on Botox and tolerates it well.    It has reduced her migraine frequency tremendously.     Update 10/10/2018: She has had a severe occipital headache bilaterally for the past 2 weeks.  She notes that this headache is very similar to ones that she has experienced in the past and improved with trigger point injections/occipital nerve blocks.  Pain is occipital and radiates towards the forehead.  Pain is worse with movements, especially sudden jarring movement.  In general, migraines have done much better since being on Botox therapy but she notes that a few times a year she will get the severe occipital headaches..  She has had a radiofrequency ablation for her lumbar facet hypertrophy.  Lower back pain is much better.  She tolerated the procedures well.  Update 08/04/2018: Her chronic migraine is generally doing well with Botox.  Is tolerating the injections well.  She has now been on therapy since 2009.  She still gets occasional headaches that can be severe, especially when she travels.  When the migraine occurs she gets throbbing pain associated with nausea, vomiting and photophobia.  Moving worsens the pain.  Self-administered IM Toradol shots have been the most beneficial.  She is  noting a lot of back pain.  She has left greater than right facet hypertrophy that is severe at L5-S1 and moderate at L4-L5.  She is scheduled to have a facet injection next week.  We discussed that if she gets a benefit from that then we could consider a medial branch blocks/RFA based on results  IMPRESSION: This MRI of the lumbar spine without contrast multilevel degenerative changes as detailed above.  Most significant findings are:: 1.    At L4-L5, there is moderate facet hypertrophy and a small joint effusion on the right, and left ligamentunm flavum hypertrophy.  There is no nerve root compression. 2.    At L5-S1, there is severe facet hypertrophy worse on the left and small joint effusion on the left.  There is no nerve root compression  She has obstructive sleep apnea and uses CPAP regularly.  She is tolerating it well.   Update 05/02/2018: Migraines continue to do fairly well on Botox.  She has tolerated the injections well.  She has been on the therapy for 9 to 10 years.  Headaches are most likely to occur when she travels.  Migraine pain is throbbing and is associated with nausea, photophobia and phonophobia.  Moving will worsen the pain.  When a more severe migraine occurs, she often takes self-administered IM Toradol with benefit.  She is on CPAP nightly for obstructive sleep apnea with benefit.  With CPAP she generally feels more alert and less tired  and sleepy the next day.  Update 01/27/2018: She feels her migraines are about the same.    Botox has helped a lot.   She has been on it for about 9 years.   She still notes that headaches are worse with barometric changes and traveling.    For breakthrough migraines at a more severe she will sometimes do Toradol shots with benefit. When present, pain is throbbing and associated with photophobia, phonophobia and nausea.  Her OSA does well. She uses CPAP nightly. She feels better the next morning and day when she uses CPAP at night. She has  lost some 10-12 pounds since the last visit and she feels a little better.  Her lower back pain is a little bit better with the weight loss.    Update 10/27/17:  She got her new machine through [facility].   She notes it seems to be working and she is compliant (AHI = 0.7 and compliance is 100%).    However, she feels the pressure is much lower than the other machine.    We discussed switching to Auto-PAP.      Her chronic migraines are doing well.   She did have a lot of migraines the first 2 -3 weeks of the cycle but then had very few the past 2- 2 1/2 months.  When present, headaches are associated with nausea, photophobia and phonophobia. Moving will make the headaches worse She notes some neck pain.  From 07/22/2017:     Chronic Migraine/neck pain:    Her headaches continued to do well with Botox therapy. She's been on Botox since 2010 which has led to a great reduction in her headache frequency from 29-30/30 a month to just a couple of months.      When she has a migraine, pain is usually worse on the right than the left. Headaches can be triggered by flying. When present, pain is throbbing and is associated with photophobia and phonophobia. With Botox, she has fewer headaches, especially the first 2 months of each cycle.      When she has a more severe migraine she takes Toradol injections or Maxalt pills. She has tolerated Botox very well and has not had any complications.   She is more likely to get as severe migraine during the last couple weeks of each cycle.  Migraine history:  She has a long history of chronic migraines that were occurring almost every day (25-28 days/month) for 4 or more hours a day. Multiple medications were tried for prophylactic and abortive therapies with suboptimal response.    In the past, she has failed multiple medications including:  Antiepileptics (Topamax, Keppra, zonisamide), anti-depressants (Cymbalta, Prozac), multiple NSAID's, betas blockers (Inderal, atenolol),  muscle relaxants (soma, tizanidine, cyclobenzaprine) in various combinations.   Once the headache occurs, she has tried triptan's with incomplete benefit much of the time. She has also tried opiates.   IM Toradol has been most effective when one occurs.  Neck pain:  She gets a migraine she will have neck pain and she also notes neck pain at times. Botox has helped the neck pain as well. She has no pain radiating into the arms. MRI on 09/12/2014 showed mild degenerative changes at C5-C6 and C6-C7 with no definite nerve root compression.       OSA:  She continues to use her CPAP on a nightly basis. She has trouble sleeping without CPAP and she takes it with her when she travels most of the time. She  has had some issues with the machine and it is time for her to get a new one.   She feels less fatigue when she uses her CPAP the next day.  She deneis much dayitme hypersomnia.   She tolerates it well.    Abnormal MRI/?MS:  She was diagnosed with MS in the past and was on interferon therapy for several years.   I "undiagnosed her" around 2010 and she got a second opinion from Dr. Terie Purser who also concurred.  MRI shows small round foci predominantly in the subcortical and deep white matter in a nonspecific manner.  Her last MRI of the brain was in 2016 and was stable compared to the previous ones.  She had had paresthesias and fatigue in the past that contributed to her diagnosis.   We have discussed that she has been stable times many years or therapy that MS is significantly less likely.   REVIEW OF SYSTEMS:  Constitutional: No fevers, chills, sweats, or change in appetite Eyes: No visual changes, double vision, eye pain Ear, nose and throat: No hearing loss, ear pain, nasal congestion, sore throat Cardiovascular: No chest pain, palpitations Respiratory:  see above GastrointestinaI: No nausea, vomiting, diarrhea, abdominal pain, fecal incontinence Genitourinary:  No dysuria, urinary retention or  frequency.  No nocturia.   Dysfunctional uterine bleeding Musculoskeletal:  notes low back pain that has been worse the last couple months and neck pain that is helped by Botox Integumentary: No rash, pruritus, skin lesions Neurological: as above Psychiatric: No depression at this time.  No anxiety Endocrine: No palpitations, diaphoresis, change in appetite, change in weigh or increased thirst Hematologic/Lymphatic:   She has anemia due to dysfunctional uterine bleeding. Allergic/Immunologic: No itchy/runny eyes, nasal congestion, recent allergic reactions, rashes  ALLERGIES: Allergies  Allergen Reactions  . Cefaclor Swelling and Rash     neck swelling  . Prochlorperazine Maleate Rash and Other (See Comments)     jittery;tachycardia  . Bupropion Anxiety  . Sulfamethoxazole-Trimethoprim Nausea Only  . Tessalon Perles [Benzonatate] Rash    HOME MEDICATIONS: Outpatient Medications Prior to Visit  Medication Sig Dispense Refill  . atenolol (TENORMIN) 25 MG tablet TAKE 1 TABLET BY MOUTH EVERY DAY 90 tablet 3  . B-D 3CC LUER-LOK SYR 23GX1" 23G X 1" 3 ML MISC Use as needed with 1 1/2 inch needle (also dispense) 18 each 5  . Botulinum Toxin Type A (BOTOX) 200 UNITS SOLR MD to inject every 3 mos. for migraine headaches. 1 each 3  . ketorolac (TORADOL) 60 MG/2ML SOLN injection Inject up to 2 mL (60 mg) prn headache.  No more than 2 mL/day or 8 mL/month 8 mL 5  . meloxicam (MOBIC) 15 MG tablet Take 1 tablet (15 mg total) by mouth daily. 30 tablet 1  . metFORMIN (GLUCOPHAGE-XR) 500 MG 24 hr tablet TAKE 1 TABLET BY MOUTH EVERY DAY WITH BREAKFAST  0  . Multiple Vitamins-Minerals (MULTIVITAMIN WITH MINERALS) tablet Take by mouth.    Marland Kitchen NEEDLE, DISP, 25 G 25G X 1-1/2" MISC     . ondansetron (ZOFRAN-ODT) 8 MG disintegrating tablet Take 1 tablet (8 mg total) by mouth every 8 (eight) hours as needed for nausea or vomiting. 30 tablet 2  . oxyCODONE-acetaminophen (PERCOCET) 10-325 MG tablet Take 1 tablet  by mouth every 8 (eight) hours as needed for pain. 1 pill every 4-6 hours as needed for breakthrough pain as needed 90 tablet 0  . rizatriptan (MAXALT-MLT) 10 MG disintegrating tablet TAKE 1 TABLET BY  MOUTH AS NEEDED FOR MIGRAINE. MAY REPEAT IN 2 HOURS IF NEEDED 12 tablet 9  . tiZANidine (ZANAFLEX) 4 MG tablet TAKE 1 TABLET BY MOUTH THREE TIMES A DAY 90 tablet 5  . YALE DISP NEEDLES 25GX1-1/2" 25G X 1-1/2" MISC Use for IM injections as needed 18 each 3  . pantoprazole (PROTONIX) 40 MG tablet Take 40 mg by mouth 2 (two) times daily.      No facility-administered medications prior to visit.     PAST MEDICAL HISTORY: Past Medical History:  Diagnosis Date  . Headache   . Hypertension   . Vision abnormalities     PAST SURGICAL HISTORY: Past Surgical History:  Procedure Laterality Date  . SINUS IRRIGATION      FAMILY HISTORY: Family History  Problem Relation Age of Onset  . Breast cancer Mother   . Melanoma Mother   . Atrial fibrillation Mother   . Supraventricular tachycardia Mother   . Heart attack Father   . Hypertension Father   . Hyperlipidemia Father     SOCIAL HISTORY:  Social History   Socioeconomic History  . Marital status: Single    Spouse name: Not on file  . Number of children: Not on file  . Years of education: Not on file  . Highest education level: Not on file  Occupational History  . Not on file  Social Needs  . Financial resource strain: Not on file  . Food insecurity:    Worry: Not on file    Inability: Not on file  . Transportation needs:    Medical: Not on file    Non-medical: Not on file  Tobacco Use  . Smoking status: Never Smoker  . Smokeless tobacco: Never Used  Substance and Sexual Activity  . Alcohol use: No    Alcohol/week: 0.0 standard drinks    Comment: Rare  . Drug use: No  . Sexual activity: Not on file  Lifestyle  . Physical activity:    Days per week: Not on file    Minutes per session: Not on file  . Stress: Not on file    Relationships  . Social connections:    Talks on phone: Not on file    Gets together: Not on file    Attends religious service: Not on file    Active member of club or organization: Not on file    Attends meetings of clubs or organizations: Not on file    Relationship status: Not on file  . Intimate partner violence:    Fear of current or ex partner: Not on file    Emotionally abused: Not on file    Physically abused: Not on file    Forced sexual activity: Not on file  Other Topics Concern  . Not on file  Social History Narrative  . Not on file     PHYSICAL EXAM  Vitals:   11/09/18 1023  BP: 132/87  Pulse: 65  Weight: 280 lb 8 oz (127.2 kg)  Height: 5\' 6"  (1.676 m)    Body mass index is 45.27 kg/m.   General: The patient is well-developed and well-nourished and in no acute distress   Musculoskeletal: There is tenderness over the splenius capitis and upper cervical paraspinal muscles bilaterally.  Slightly reduced range of motion in the neck.  Neurologic Exam  Mental status: The patient is alert and oriented x 3 at the time of the examination.  Speech is normal.  Cranial nerves: Extraocular movements are full.  Facial strength  is normal. Trapezius strength is normal.   No obvious hearing deficits are noted.  Motor:   Muscle tone and bulk is normal. She has normal strength.    Gait and station: Station and gait are normal.         DIAGNOSTIC DATA (LABS, IMAGING, TESTING) - I reviewed patient records, labs, notes, testing and imaging myself where available.     ASSESSMENT AND PLAN  Common migraine with intractable migraine  Chronic migraine  Obstructive apnea  Neck pain   1.    Trigger point injection 80 mg Depo-Medrol in 3 cc Marcaine into the splenius capitis muscles bilaterally using sterile technique..  She tolerated the injection well and there were no complications. 2.    She will be coming in later this month for her Botox injections for  chronic migraine.  She can take Maxalt or IM Toradol when she has a migraine.  She takes Percocet on occasion. 3.   return to see me in for the next Botox injection, or sooner if she has new or worsening neurologic symptoms.    Richard A. Felecia Shelling, MD, PhD 01/00/7121, 9:75 PM Certified in Neurology, Clinical Neurophysiology, Sleep Medicine, Pain Medicine and Neuroimaging  [facility] [address] [place], [place] [address] [phone]

## 2018-11-11 ENCOUNTER — Telehealth: Payer: Self-pay | Admitting: Neurology

## 2018-11-11 ENCOUNTER — Ambulatory Visit (INDEPENDENT_AMBULATORY_CARE_PROVIDER_SITE_OTHER): Payer: BC Managed Care – PPO | Admitting: Neurology

## 2018-11-11 ENCOUNTER — Encounter: Payer: Self-pay | Admitting: Neurology

## 2018-11-11 VITALS — BP 141/83 | HR 59

## 2018-11-11 DIAGNOSIS — G4733 Obstructive sleep apnea (adult) (pediatric): Secondary | ICD-10-CM

## 2018-11-11 DIAGNOSIS — M542 Cervicalgia: Secondary | ICD-10-CM

## 2018-11-11 DIAGNOSIS — M47816 Spondylosis without myelopathy or radiculopathy, lumbar region: Secondary | ICD-10-CM

## 2018-11-11 DIAGNOSIS — IMO0002 Reserved for concepts with insufficient information to code with codable children: Secondary | ICD-10-CM

## 2018-11-11 DIAGNOSIS — G43709 Chronic migraine without aura, not intractable, without status migrainosus: Secondary | ICD-10-CM

## 2018-11-11 NOTE — Telephone Encounter (Signed)
3 mo Botox inj  °

## 2018-11-11 NOTE — Progress Notes (Signed)
[facility]  PATIENT: Pamela Murphy DOB: 1971/02/24 REASON FOR VISIT: Chronic Migraine headaches   HISTORICAL  CHIEF COMPLAINT:  Chief Complaint  Patient presents with  . Follow-up    RM 13, alone. Last seen 11/09/18 for nerve block. Here today for botox injections for migraines. Botox B/B-200U total (2-100U vials)    HISTORY OF PRESENT ILLNESS:  Pamela Murphy is a 47 y.o. woman with a chronic migraine disorder, obstructive sleep apnea and abnormal MRI of the brain.   Update 11/11/2018: Her headaches were doing very well until a couple weeks ago and they have been much worse since.  She needed a trigger point injection earlier this week.  Since the trigger point injection had excepted that again.  In general the Botox has worked very well though or after the last couple weeks of the cycle headaches returned.  When she gets a migraine she gets nausea, vomiting, photophobia and phonophobia.  Moving worsens the headache.  She uses CPAP nightly and sleeps well.  Her low back pain has resolved since she had medial branch radiofrequency ablation.  Update 08/04/2018: Her chronic migraine is generally doing well with Botox.  Is tolerating the injections well.  She has now been on therapy since 2009.  She still gets occasional headaches that can be severe, especially when she travels.  When the migraine occurs she gets throbbing pain associated with nausea, vomiting and photophobia.  Moving worsens the pain.  Self-administered IM Toradol shots have been the most beneficial.  She is noting a lot of back pain.  She has left greater than right facet hypertrophy that is severe at L5-S1 and moderate at L4-L5.  She is scheduled to have a facet injection next week.  We discussed that if she gets a benefit from that then we could consider a medial branch blocks/RFA based on results  IMPRESSION: This MRI of the lumbar spine without contrast multilevel degenerative changes as detailed  above.  Most significant findings are:: 1.    At L4-L5, there is moderate facet hypertrophy and a small joint effusion on the right, and left ligamentunm flavum hypertrophy.  There is no nerve root compression. 2.    At L5-S1, there is severe facet hypertrophy worse on the left and small joint effusion on the left.  There is no nerve root compression  She has obstructive sleep apnea and uses CPAP regularly.  She is tolerating it well.   Update 05/02/2018: Migraines continue to do fairly well on Botox.  She has tolerated the injections well.  She has been on the therapy for 9 to 10 years.  Headaches are most likely to occur when she travels.  Migraine pain is throbbing and is associated with nausea, photophobia and phonophobia.  Moving will worsen the pain.  When a more severe migraine occurs, she often takes self-administered IM Toradol with benefit.  She is on CPAP nightly for obstructive sleep apnea with benefit.  With CPAP she generally feels more alert and less tired and sleepy the next day.  Update 01/27/2018: She feels her migraines are about the same.    Botox has helped a lot.   She has been on it for about 9 years.   She still notes that headaches are worse with barometric changes and traveling.    For breakthrough migraines at a more severe she will sometimes do Toradol shots with benefit. When present, pain is throbbing and associated with photophobia, phonophobia and nausea.  Her OSA does well.  She uses CPAP nightly. She feels better the next morning and day when she uses CPAP at night. She has lost some 10-12 pounds since the last visit and she feels a little better.  Her lower back pain is a little bit better with the weight loss.    Update 10/27/17:  She got her new machine through [facility].   She notes it seems to be working and she is compliant (AHI = 0.7 and compliance is 100%).    However, she feels the pressure is much lower than the other machine.    We discussed switching to  Auto-PAP.      Her chronic migraines are doing well.   She did have a lot of migraines the first 2 -3 weeks of the cycle but then had very few the past 2- 2 1/2 months.  When present, headaches are associated with nausea, photophobia and phonophobia. Moving will make the headaches worse She notes some neck pain.  From 07/22/2017:     Chronic Migraine/neck pain:    Her headaches continued to do well with Botox therapy. She's been on Botox since 2010 which has led to a great reduction in her headache frequency from 29-30/30 a month to just a couple of months.      When she has a migraine, pain is usually worse on the right than the left. Headaches can be triggered by flying. When present, pain is throbbing and is associated with photophobia and phonophobia. With Botox, she has fewer headaches, especially the first 2 months of each cycle.      When she has a more severe migraine she takes Toradol injections or Maxalt pills. She has tolerated Botox very well and has not had any complications.   She is more likely to get as severe migraine during the last couple weeks of each cycle.  Migraine history:  She has a long history of chronic migraines that were occurring almost every day (25-28 days/month) for 4 or more hours a day. Multiple medications were tried for prophylactic and abortive therapies with suboptimal response.    In the past, she has failed multiple medications including:  Antiepileptics (Topamax, Keppra, zonisamide), anti-depressants (Cymbalta, Prozac), multiple NSAID's, betas blockers (Inderal, atenolol), muscle relaxants (soma, tizanidine, cyclobenzaprine) in various combinations.   Once the headache occurs, she has tried triptan's with incomplete benefit much of the time. She has also tried opiates.   IM Toradol has been most effective when one occurs.  Neck pain:  She gets a migraine she will have neck pain and she also notes neck pain at times. Botox has helped the neck pain as well. She has  no pain radiating into the arms. MRI on 09/12/2014 showed mild degenerative changes at C5-C6 and C6-C7 with no definite nerve root compression.       OSA:  She continues to use her CPAP on a nightly basis. She has trouble sleeping without CPAP and she takes it with her when she travels most of the time. She has had some issues with the machine and it is time for her to get a new one.   She feels less fatigue when she uses her CPAP the next day.  She deneis much dayitme hypersomnia.   She tolerates it well.    Abnormal MRI/?MS:  She was diagnosed with MS in the past and was on interferon therapy for several years.   I "undiagnosed her" around 2010 and she got a second opinion from Dr. Terie Purser who also  concurred.  MRI shows small round foci predominantly in the subcortical and deep white matter in a nonspecific manner.  Her last MRI of the brain was in 2016 and was stable compared to the previous ones.  She had had paresthesias and fatigue in the past that contributed to her diagnosis.   We have discussed that she has been stable times many years or therapy that MS is significantly less likely.   REVIEW OF SYSTEMS:  Constitutional: No fevers, chills, sweats, or change in appetite Eyes: No visual changes, double vision, eye pain Ear, nose and throat: No hearing loss, ear pain, nasal congestion, sore throat Cardiovascular: No chest pain, palpitations Respiratory:  see above GastrointestinaI: No nausea, vomiting, diarrhea, abdominal pain, fecal incontinence Genitourinary:  No dysuria, urinary retention or frequency.  No nocturia.   Dysfunctional uterine bleeding Musculoskeletal:  notes low back pain that has been worse the last couple months and neck pain that is helped by Botox Integumentary: No rash, pruritus, skin lesions Neurological: as above Psychiatric: No depression at this time.  No anxiety Endocrine: No palpitations, diaphoresis, change in appetite, change in weigh or increased  thirst Hematologic/Lymphatic:   She has anemia due to dysfunctional uterine bleeding. Allergic/Immunologic: No itchy/runny eyes, nasal congestion, recent allergic reactions, rashes  ALLERGIES: Allergies  Allergen Reactions  . Cefaclor Swelling and Rash     neck swelling  . Prochlorperazine Maleate Rash and Other (See Comments)     jittery;tachycardia  . Bupropion Anxiety  . Sulfamethoxazole-Trimethoprim Nausea Only  . Tessalon Perles [Benzonatate] Rash    HOME MEDICATIONS: Outpatient Medications Prior to Visit  Medication Sig Dispense Refill  . atenolol (TENORMIN) 25 MG tablet TAKE 1 TABLET BY MOUTH EVERY DAY 90 tablet 3  . B-D 3CC LUER-LOK SYR 23GX1" 23G X 1" 3 ML MISC Use as needed with 1 1/2 inch needle (also dispense) 18 each 5  . Botulinum Toxin Type A (BOTOX) 200 UNITS SOLR MD to inject every 3 mos. for migraine headaches. 1 each 3  . ketorolac (TORADOL) 60 MG/2ML SOLN injection Inject up to 2 mL (60 mg) prn headache.  No more than 2 mL/day or 8 mL/month 8 mL 5  . meloxicam (MOBIC) 15 MG tablet Take 1 tablet (15 mg total) by mouth daily. 30 tablet 1  . metFORMIN (GLUCOPHAGE-XR) 500 MG 24 hr tablet TAKE 1 TABLET BY MOUTH EVERY DAY WITH BREAKFAST  0  . Multiple Vitamins-Minerals (MULTIVITAMIN WITH MINERALS) tablet Take by mouth.    Marland Kitchen NEEDLE, DISP, 25 G 25G X 1-1/2" MISC     . ondansetron (ZOFRAN-ODT) 8 MG disintegrating tablet Take 1 tablet (8 mg total) by mouth every 8 (eight) hours as needed for nausea or vomiting. 30 tablet 2  . oxyCODONE-acetaminophen (PERCOCET) 10-325 MG tablet Take 1 tablet by mouth every 8 (eight) hours as needed for pain. 1 pill every 4-6 hours as needed for breakthrough pain as needed 90 tablet 0  . rizatriptan (MAXALT-MLT) 10 MG disintegrating tablet TAKE 1 TABLET BY MOUTH AS NEEDED FOR MIGRAINE. MAY REPEAT IN 2 HOURS IF NEEDED 12 tablet 9  . tiZANidine (ZANAFLEX) 4 MG tablet TAKE 1 TABLET BY MOUTH THREE TIMES A DAY 90 tablet 5  . YALE DISP NEEDLES  25GX1-1/2" 25G X 1-1/2" MISC Use for IM injections as needed 18 each 3  . pantoprazole (PROTONIX) 40 MG tablet Take 40 mg by mouth 2 (two) times daily.      No facility-administered medications prior to visit.  PAST MEDICAL HISTORY: Past Medical History:  Diagnosis Date  . Headache   . Hypertension   . Vision abnormalities     PAST SURGICAL HISTORY: Past Surgical History:  Procedure Laterality Date  . SINUS IRRIGATION      FAMILY HISTORY: Family History  Problem Relation Age of Onset  . Breast cancer Mother   . Melanoma Mother   . Atrial fibrillation Mother   . Supraventricular tachycardia Mother   . Heart attack Father   . Hypertension Father   . Hyperlipidemia Father     SOCIAL HISTORY:  Social History   Socioeconomic History  . Marital status: Single    Spouse name: Not on file  . Number of children: Not on file  . Years of education: Not on file  . Highest education level: Not on file  Occupational History  . Not on file  Social Needs  . Financial resource strain: Not on file  . Food insecurity:    Worry: Not on file    Inability: Not on file  . Transportation needs:    Medical: Not on file    Non-medical: Not on file  Tobacco Use  . Smoking status: Never Smoker  . Smokeless tobacco: Never Used  Substance and Sexual Activity  . Alcohol use: No    Alcohol/week: 0.0 standard drinks    Comment: Rare  . Drug use: No  . Sexual activity: Not on file  Lifestyle  . Physical activity:    Days per week: Not on file    Minutes per session: Not on file  . Stress: Not on file  Relationships  . Social connections:    Talks on phone: Not on file    Gets together: Not on file    Attends religious service: Not on file    Active member of club or organization: Not on file    Attends meetings of clubs or organizations: Not on file    Relationship status: Not on file  . Intimate partner violence:    Fear of current or ex partner: Not on file     Emotionally abused: Not on file    Physically abused: Not on file    Forced sexual activity: Not on file  Other Topics Concern  . Not on file  Social History Narrative  . Not on file     PHYSICAL EXAM  Vitals:   11/11/18 0912  BP: (!) 141/83  Pulse: (!) 59    There is no height or weight on file to calculate BMI.   General: The patient is well-developed and well-nourished and in no acute distress   Musculoskeletal:   The neck has a good range of motion and is nontender.  Neurologic Exam  Mental status: The patient is alert and oriented x 3 at the time of the examination.  Speech is normal.  Cranial nerves: Extraocular movements are full.  Facial strength and sensation was normal.  Trapezius strength was normal.  No obvious hearing deficits are noted.  Motor:   Muscle tone and bulk is normal. She has normal strength.    Gait and station: Station and gait are normal.  Romberg is negative.  Tandem gait is normal.  DTR's: Normal in legs.    DIAGNOSTIC DATA (LABS, IMAGING, TESTING) - I reviewed patient records, labs, notes, testing and imaging myself where available.     ASSESSMENT AND PLAN  Chronic migraine  Obstructive sleep apnea  Facet hypertrophy of lumbar region  Neck pain  1.    Inject 190 units Botox (10 units wasted) :   Frontalis (5 U x 4), nasalis/corrugators (5 U x 3), temporalis  (5 U x 9 - 5R, 4L), occipitalis (5 U x 6), splenius capitis (15 U x 2), trapezius (15 U x 2), C6C7 paraspinal muscles (10 U x 2), wasted (10 U) 2.    She will use Maxalt and/or IM Toradol for migraine headaches.  Percocet for breakthrough.  A prescription last for many months. 3.   Continue to use CPAP nightly for OSA. 4.   Hopefully she will get a long-term benefit from the knee medial branch radiofrequency ablation.  We discussed that many people get a benefit for 9 to 15 months.   5.   Return to see me in about 3 months for the next Botox injection, or sooner if she has  new or worsening neurologic symptoms.    Haze Antillon A. Felecia Shelling, MD, PhD 01/65/5374, 8:27 PM Certified in Neurology, Clinical Neurophysiology, Sleep Medicine, Pain Medicine and Neuroimaging  [facility] [address], [place], [place] [address] [phone]

## 2018-11-14 ENCOUNTER — Telehealth: Payer: Self-pay | Admitting: Neurology

## 2018-11-14 NOTE — Telephone Encounter (Signed)
Patient did not call back at the time she told me she would so I called her and scheduled apt.

## 2018-11-14 NOTE — Telephone Encounter (Addendum)
Error. DW  °

## 2018-11-14 NOTE — Telephone Encounter (Signed)
I called to schedule the patient but she was driving and asked to call me back.

## 2018-12-05 ENCOUNTER — Encounter: Payer: Self-pay | Admitting: *Deleted

## 2018-12-05 ENCOUNTER — Other Ambulatory Visit: Payer: Self-pay | Admitting: Neurology

## 2018-12-24 ENCOUNTER — Other Ambulatory Visit: Payer: Self-pay | Admitting: Neurology

## 2019-02-13 ENCOUNTER — Telehealth: Payer: Self-pay | Admitting: Neurology

## 2019-02-13 NOTE — Telephone Encounter (Signed)
Authorization form originally sent on 03/13 by Jayme Cloud. I called today and [employer] said they have not yet received it. Dana C. Is sending fax again today.

## 2019-02-13 NOTE — Telephone Encounter (Signed)
Faxed on 02/03/2019 Received ok conformation faxed to [phone]  .  Faxed x 2  Received ok conformation x 2 . Thanks Hinton Dyer.

## 2019-02-14 ENCOUNTER — Ambulatory Visit: Payer: BC Managed Care – PPO | Admitting: Neurology

## 2019-02-14 NOTE — Telephone Encounter (Signed)
[id number] pending review for botox authorization.

## 2019-02-14 NOTE — Telephone Encounter (Signed)
Botox authorization approved authorization number is [id number] auth dates (02/13/19-02/13/20). DW

## 2019-02-15 ENCOUNTER — Ambulatory Visit: Payer: Self-pay | Admitting: Neurology

## 2019-02-19 ENCOUNTER — Other Ambulatory Visit: Payer: Self-pay | Admitting: Neurology

## 2019-02-27 ENCOUNTER — Ambulatory Visit: Payer: BC Managed Care – PPO | Admitting: Neurology

## 2019-02-27 ENCOUNTER — Encounter: Payer: Self-pay | Admitting: Neurology

## 2019-02-27 ENCOUNTER — Other Ambulatory Visit: Payer: Self-pay

## 2019-02-27 VITALS — BP 164/94 | HR 85 | Ht 66.0 in

## 2019-02-27 DIAGNOSIS — M542 Cervicalgia: Secondary | ICD-10-CM

## 2019-02-27 DIAGNOSIS — G4733 Obstructive sleep apnea (adult) (pediatric): Secondary | ICD-10-CM

## 2019-02-27 DIAGNOSIS — G43709 Chronic migraine without aura, not intractable, without status migrainosus: Secondary | ICD-10-CM

## 2019-02-27 DIAGNOSIS — IMO0002 Reserved for concepts with insufficient information to code with codable children: Secondary | ICD-10-CM

## 2019-02-27 MED ORDER — OXYCODONE-ACETAMINOPHEN 10-325 MG PO TABS: 1.0000 | ORAL_TABLET | Freq: Three times a day (TID) | ORAL | 0 refills | Status: DC | PRN

## 2019-02-27 NOTE — Progress Notes (Signed)
[facility]  PATIENT: Pamela Murphy DOB: Jun 17, 1971 REASON FOR VISIT: Chronic Migraine headaches   HISTORICAL  CHIEF COMPLAINT:  Chief Complaint  Patient presents with   Botulinum Toxin Injection    RM 12. Last here for botox on 11/11/18. Botox B/B-200U total (2-100U vials)    HISTORY OF PRESENT ILLNESS:  Pamela Murphy is a 48 y.o. woman with a chronic migraine disorder, obstructive sleep apnea and abnormal MRI of the brain.   Update 02/27/2019: She feels her migraines did well for close to 3 months but they have worsened in the last couple weeks.  They are now occurring daily again.  In general, Botox has greatly helped her chronic migraine headache frequency and intensity.  She usually goes 2 to 3 months with only a few headaches.  When present, she has throbbing pain associated with nausea, photophobia and sometimes vomiting.  Moving will make the pain worse.  She has not been able to get intramuscular Toradol shots.  Percocet will sometimes help.   Her back pain is much better since having the medial branch RFA.Marland Kitchen  She has OSA and uses CPAP nightly.  She sleeps well.    Update 11/11/2018: Her headaches were doing very well until a couple weeks ago and they have been much worse since.  She needed a trigger point injection earlier this week.  Since the trigger point injection had excepted that again.  In general the Botox has worked very well though or after the last couple weeks of the cycle headaches returned.  When she gets a migraine she gets nausea, vomiting, photophobia and phonophobia.  Moving worsens the headache.  She uses CPAP nightly and sleeps well.  Her low back pain has resolved since she had medial branch radiofrequency ablation.  Update 08/04/2018: Her chronic migraine is generally doing well with Botox.  Is tolerating the injections well.  She has now been on therapy since 2009.  She still gets occasional headaches that can be severe, especially when  she travels.  When the migraine occurs she gets throbbing pain associated with nausea, vomiting and photophobia.  Moving worsens the pain.  Self-administered IM Toradol shots have been the most beneficial.  She is noting a lot of back pain.  She has left greater than right facet hypertrophy that is severe at L5-S1 and moderate at L4-L5.  She is scheduled to have a facet injection next week.  We discussed that if she gets a benefit from that then we could consider a medial branch blocks/RFA based on results  IMPRESSION: This MRI of the lumbar spine without contrast multilevel degenerative changes as detailed above.  Most significant findings are:: 1.    At L4-L5, there is moderate facet hypertrophy and a small joint effusion on the right, and left ligamentunm flavum hypertrophy.  There is no nerve root compression. 2.    At L5-S1, there is severe facet hypertrophy worse on the left and small joint effusion on the left.  There is no nerve root compression  She has obstructive sleep apnea and uses CPAP regularly.  She is tolerating it well.   Update 05/02/2018: Migraines continue to do fairly well on Botox.  She has tolerated the injections well.  She has been on the therapy for 9 to 10 years.  Headaches are most likely to occur when she travels.  Migraine pain is throbbing and is associated with nausea, photophobia and phonophobia.  Moving will worsen the pain.  When a more severe migraine  occurs, she often takes self-administered IM Toradol with benefit.  She is on CPAP nightly for obstructive sleep apnea with benefit.  With CPAP she generally feels more alert and less tired and sleepy the next day.  Update 01/27/2018: She feels her migraines are about the same.    Botox has helped a lot.   She has been on it for about 9 years.   She still notes that headaches are worse with barometric changes and traveling.    For breakthrough migraines at a more severe she will sometimes do Toradol shots with  benefit. When present, pain is throbbing and associated with photophobia, phonophobia and nausea.  Her OSA does well. She uses CPAP nightly. She feels better the next morning and day when she uses CPAP at night. She has lost some 10-12 pounds since the last visit and she feels a little better.  Her lower back pain is a little bit better with the weight loss.    Update 10/27/17:  She got her new machine through [facility].   She notes it seems to be working and she is compliant (AHI = 0.7 and compliance is 100%).    However, she feels the pressure is much lower than the other machine.    We discussed switching to Auto-PAP.      Her chronic migraines are doing well.   She did have a lot of migraines the first 2 -3 weeks of the cycle but then had very few the past 2- 2 1/2 months.  When present, headaches are associated with nausea, photophobia and phonophobia. Moving will make the headaches worse She notes some neck pain.  From 07/22/2017:     Chronic Migraine/neck pain:    Her headaches continued to do well with Botox therapy. She's been on Botox since 2010 which has led to a great reduction in her headache frequency from 29-30/30 a month to just a couple of months.      When she has a migraine, pain is usually worse on the right than the left. Headaches can be triggered by flying. When present, pain is throbbing and is associated with photophobia and phonophobia. With Botox, she has fewer headaches, especially the first 2 months of each cycle.      When she has a more severe migraine she takes Toradol injections or Maxalt pills. She has tolerated Botox very well and has not had any complications.   She is more likely to get as severe migraine during the last couple weeks of each cycle.  Migraine history:  She has a long history of chronic migraines that were occurring almost every day (25-28 days/month) for 4 or more hours a day. Multiple medications were tried for prophylactic and abortive therapies with  suboptimal response.    In the past, she has failed multiple medications including:  Antiepileptics (Topamax, Keppra, zonisamide), anti-depressants (Cymbalta, Prozac), multiple NSAID's, betas blockers (Inderal, atenolol), muscle relaxants (soma, tizanidine, cyclobenzaprine) in various combinations.   Once the headache occurs, she has tried triptan's with incomplete benefit much of the time. She has also tried opiates.   IM Toradol has been most effective when one occurs.  Neck pain:  She gets a migraine she will have neck pain and she also notes neck pain at times. Botox has helped the neck pain as well. She has no pain radiating into the arms. MRI on 09/12/2014 showed mild degenerative changes at C5-C6 and C6-C7 with no definite nerve root compression.  OSA:  She continues to use her CPAP on a nightly basis. She has trouble sleeping without CPAP and she takes it with her when she travels most of the time. She has had some issues with the machine and it is time for her to get a new one.   She feels less fatigue when she uses her CPAP the next day.  She deneis much dayitme hypersomnia.   She tolerates it well.    Abnormal MRI/?MS:  She was diagnosed with MS in the past and was on interferon therapy for several years.   I "undiagnosed her" around 2010 and she got a second opinion from Dr. Terie Purser who also concurred.  MRI shows small round foci predominantly in the subcortical and deep white matter in a nonspecific manner.  Her last MRI of the brain was in 2016 and was stable compared to the previous ones.  She had had paresthesias and fatigue in the past that contributed to her diagnosis.   We have discussed that she has been stable times many years or therapy that MS is significantly less likely.   REVIEW OF SYSTEMS:  Constitutional: No fevers, chills, sweats, or change in appetite Eyes: No visual changes, double vision, eye pain Ear, nose and throat: No hearing loss, ear pain, nasal congestion, sore  throat Cardiovascular: No chest pain, palpitations Respiratory:  see above GastrointestinaI: No nausea, vomiting, diarrhea, abdominal pain, fecal incontinence Genitourinary:  No dysuria, urinary retention or frequency.  No nocturia.   Dysfunctional uterine bleeding Musculoskeletal:  notes low back pain that has been worse the last couple months and neck pain that is helped by Botox Integumentary: No rash, pruritus, skin lesions Neurological: as above Psychiatric: No depression at this time.  No anxiety Endocrine: No palpitations, diaphoresis, change in appetite, change in weigh or increased thirst Hematologic/Lymphatic:   She has anemia due to dysfunctional uterine bleeding. Allergic/Immunologic: No itchy/runny eyes, nasal congestion, recent allergic reactions, rashes  ALLERGIES: Allergies  Allergen Reactions   Cefaclor Swelling and Rash     neck swelling   Prochlorperazine Maleate Rash and Other (See Comments)     jittery;tachycardia   Bupropion Anxiety   Sulfamethoxazole-Trimethoprim Nausea Only   Tessalon Perles [Benzonatate] Rash    HOME MEDICATIONS: Outpatient Medications Prior to Visit  Medication Sig Dispense Refill   atenolol (TENORMIN) 25 MG tablet TAKE 1 TABLET BY MOUTH EVERY DAY 90 tablet 3   Botulinum Toxin Type A (BOTOX) 200 UNITS SOLR MD to inject every 3 mos. for migraine headaches. 1 each 3   ketorolac (TORADOL) 60 MG/2ML SOLN injection Inject up to 2 mL (60 mg) prn headache.  No more than 2 mL/day or 8 mL/month 8 mL 5   meloxicam (MOBIC) 15 MG tablet TAKE 1 TABLET BY MOUTH EVERY DAY 30 tablet 1   metFORMIN (GLUCOPHAGE-XR) 500 MG 24 hr tablet TAKE 1 TABLET BY MOUTH EVERY DAY WITH BREAKFAST  0   Multiple Vitamins-Minerals (MULTIVITAMIN WITH MINERALS) tablet Take by mouth.     NEEDLE, DISP, 25 G 25G X 1-1/2" MISC USE AS DIRECTED FOR KETOROLAC INJECTION 4 each 5   ondansetron (ZOFRAN-ODT) 8 MG disintegrating tablet Take 1 tablet (8 mg total) by mouth every  8 (eight) hours as needed for nausea or vomiting. 30 tablet 2   pantoprazole (PROTONIX) 40 MG tablet Take 40 mg by mouth 2 (two) times daily.      rizatriptan (MAXALT-MLT) 10 MG disintegrating tablet TAKE 1 TABLET BY MOUTH AS NEEDED FOR MIGRAINE. MAY  REPEAT IN 2 HOURS IF NEEDED 12 tablet 9   tiZANidine (ZANAFLEX) 4 MG tablet TAKE 1 TABLET BY MOUTH THREE TIMES A DAY 270 tablet 1   YALE DISP NEEDLES 25GX1-1/2" 25G X 1-1/2" MISC Use for IM injections as needed 18 each 3   oxyCODONE-acetaminophen (PERCOCET) 10-325 MG tablet Take 1 tablet by mouth every 8 (eight) hours as needed for pain. 1 pill every 4-6 hours as needed for breakthrough pain as needed 90 tablet 0   No facility-administered medications prior to visit.     PAST MEDICAL HISTORY: Past Medical History:  Diagnosis Date   Headache    Hypertension    Vision abnormalities     PAST SURGICAL HISTORY: Past Surgical History:  Procedure Laterality Date   SINUS IRRIGATION      FAMILY HISTORY: Family History  Problem Relation Age of Onset   Breast cancer Mother    Melanoma Mother    Atrial fibrillation Mother    Supraventricular tachycardia Mother    Heart attack Father    Hypertension Father    Hyperlipidemia Father     SOCIAL HISTORY:  Social History   Socioeconomic History   Marital status: Single    Spouse name: Not on file   Number of children: Not on file   Years of education: Not on file   Highest education level: Not on file  Occupational History   Not on file  Social Needs   Financial resource strain: Not on file   Food insecurity:    Worry: Not on file    Inability: Not on file   Transportation needs:    Medical: Not on file    Non-medical: Not on file  Tobacco Use   Smoking status: Never Smoker   Smokeless tobacco: Never Used  Substance and Sexual Activity   Alcohol use: No    Alcohol/week: 0.0 standard drinks    Comment: Rare   Drug use: No   Sexual activity: Not on  file  Lifestyle   Physical activity:    Days per week: Not on file    Minutes per session: Not on file   Stress: Not on file  Relationships   Social connections:    Talks on phone: Not on file    Gets together: Not on file    Attends religious service: Not on file    Active member of club or organization: Not on file    Attends meetings of clubs or organizations: Not on file    Relationship status: Not on file   Intimate partner violence:    Fear of current or ex partner: Not on file    Emotionally abused: Not on file    Physically abused: Not on file    Forced sexual activity: Not on file  Other Topics Concern   Not on file  Social History Narrative   Not on file     PHYSICAL EXAM  Vitals:   02/27/19 1420  BP: (!) 164/94  Pulse: 85  Height: 5\' 6"  (1.676 m)    Body mass index is 45.27 kg/m.   General: The patient is well-developed and well-nourished and in no acute distress   Musculoskeletal:   The neck has a good range of motion.  She has mild tenderness in the occiput.  Neurologic Exam  Mental status: The patient is alert and oriented x 3 at the time of the examination.  Speech is normal.  Cranial nerves: Extraocular movements are full.  Facial strength and sensation  was normal.  Trapezius strength was normal.  No obvious hearing deficits are noted.  Motor:   Muscle tone and bulk is normal. She has normal strength.    Gait and station: Station and gait are normal.  Romberg is negative.  Tandem gait is normal.     DIAGNOSTIC DATA (LABS, IMAGING, TESTING) - I reviewed patient records, labs, notes, testing and imaging myself where available.     ASSESSMENT AND PLAN  Chronic migraine  Obstructive sleep apnea  Neck pain   1.    Inject 190 units Botox (10 units wasted) :   Frontalis (5 U x 4), nasalis/corrugators (5 U x 3), temporalis  (5 U x 9 - 5R, 4L), occipitalis (5 U x 6), splenius capitis (15 U x 2), trapezius (15 U x 2), C6C7 paraspinal  muscles (10 U x 2), wasted (10 U) 2.    She will use Maxalt or Percocet for breakthrough migraines.  Hopefully she will be able to get intramuscular Toradol from the drugstore again since that was the most helpful for her.   3.   Continue to use CPAP nightly for OSA. 4.   Stay active and exercise as tolerated.  5.   Return to see me in about 3 months for the next Botox injection, or sooner if she has new or worsening neurologic symptoms.    Halle Davlin A. Felecia Shelling, MD, PhD 0/07/7352, 2:99 PM Certified in Neurology, Clinical Neurophysiology, Sleep Medicine, Pain Medicine and Neuroimaging  [facility] [address], [place], [place] [address] [phone]

## 2019-03-16 ENCOUNTER — Other Ambulatory Visit: Payer: Self-pay | Admitting: Neurology

## 2019-05-09 ENCOUNTER — Other Ambulatory Visit: Payer: Self-pay | Admitting: Neurology

## 2019-05-31 ENCOUNTER — Encounter: Payer: Self-pay | Admitting: Neurology

## 2019-06-05 ENCOUNTER — Ambulatory Visit (INDEPENDENT_AMBULATORY_CARE_PROVIDER_SITE_OTHER): Payer: BC Managed Care – PPO | Admitting: Neurology

## 2019-06-05 ENCOUNTER — Other Ambulatory Visit: Payer: Self-pay

## 2019-06-05 ENCOUNTER — Encounter: Payer: Self-pay | Admitting: Neurology

## 2019-06-05 VITALS — BP 130/80 | HR 84 | Temp 98.0°F | Ht 66.0 in | Wt 298.2 lb

## 2019-06-05 DIAGNOSIS — IMO0002 Reserved for concepts with insufficient information to code with codable children: Secondary | ICD-10-CM

## 2019-06-05 DIAGNOSIS — G43709 Chronic migraine without aura, not intractable, without status migrainosus: Secondary | ICD-10-CM | POA: Diagnosis not present

## 2019-06-05 DIAGNOSIS — G4733 Obstructive sleep apnea (adult) (pediatric): Secondary | ICD-10-CM

## 2019-06-05 DIAGNOSIS — M47816 Spondylosis without myelopathy or radiculopathy, lumbar region: Secondary | ICD-10-CM

## 2019-06-05 MED ORDER — INDOMETHACIN 25 MG PO CAPS: 25.0000 mg | ORAL_CAPSULE | Freq: Two times a day (BID) | ORAL | 3 refills | Status: DC

## 2019-06-05 NOTE — Progress Notes (Signed)
[facility]  PATIENT: Pamela Murphy DOB: 1971-04-13 REASON FOR VISIT: Chronic Migraine headaches   HISTORICAL  CHIEF COMPLAINT:  Chief Complaint  Patient presents with   Follow-up    RM 13, alone. Botox 100Ux2vials for migraines. Lot: [id number]. Expiration: 12/2021. [place]: [id number].   Migraine    Takes maxalt prn, percocet prn. Has a current headache that she has had for about the last 72 hr.    Sleep Apnea    On CPAP. Printed compliance report to review    HISTORY OF PRESENT ILLNESS:  Pamela Murphy is a 48 y.o. woman with a chronic migraine disorder, obstructive sleep apnea and abnormal MRI of the brain.   Update 06/05/2019: She has a severe migraine right now.   Usually Toradol helps in this case, but she can't get it from her pharmacy anymore.   Currently she has nausea and photophobia and phonophobia.  Moving worsens the pain.  Botox has helped reduce the number of migraines (she used to have daily migraine).  She has not had adequate control with oral options.  Her back pain continues to do very well since the RFA of the medial branch nerves late 2019  She has sleep apnea.  She sleeps well most nights but the last couple nights has not slept as well due to her headache.  A download today showed 100% compliance and a 30-day AHI of 1.4 which is excellent.  Update 02/27/2019: She feels her migraines did well for close to 3 months but they have worsened in the last couple weeks.  They are now occurring daily again.  In general, Botox has greatly helped her chronic migraine headache frequency and intensity.  She usually goes 2 to 3 months with only a few headaches.  When present, she has throbbing pain associated with nausea, photophobia and sometimes vomiting.  Moving will make the pain worse.  She has not been able to get intramuscular Toradol shots.  Percocet will sometimes help.   Her back pain is much better since having the medial branch RFA.Marland Kitchen  She has OSA  and uses CPAP nightly.  She sleeps well.    Update 11/11/2018: Her headaches were doing very well until a couple weeks ago and they have been much worse since.  She needed a trigger point injection earlier this week.  Since the trigger point injection had excepted that again.  In general the Botox has worked very well though or after the last couple weeks of the cycle headaches returned.  When she gets a migraine she gets nausea, vomiting, photophobia and phonophobia.  Moving worsens the headache.  She uses CPAP nightly and sleeps well.  Her low back pain has resolved since she had medial branch radiofrequency ablation.  Update 08/04/2018: Her chronic migraine is generally doing well with Botox.  Is tolerating the injections well.  She has now been on therapy since 2009.  She still gets occasional headaches that can be severe, especially when she travels.  When the migraine occurs she gets throbbing pain associated with nausea, vomiting and photophobia.  Moving worsens the pain.  Self-administered IM Toradol shots have been the most beneficial.  She is noting a lot of back pain.  She has left greater than right facet hypertrophy that is severe at L5-S1 and moderate at L4-L5.  She is scheduled to have a facet injection next week.  We discussed that if she gets a benefit from that then we could consider a medial branch blocks/RFA based  on results  IMPRESSION: This MRI of the lumbar spine without contrast multilevel degenerative changes as detailed above.  Most significant findings are:: 1.    At L4-L5, there is moderate facet hypertrophy and a small joint effusion on the right, and left ligamentunm flavum hypertrophy.  There is no nerve root compression. 2.    At L5-S1, there is severe facet hypertrophy worse on the left and small joint effusion on the left.  There is no nerve root compression  She has obstructive sleep apnea and uses CPAP regularly.  She is tolerating it well.   Update  05/02/2018: Migraines continue to do fairly well on Botox.  She has tolerated the injections well.  She has been on the therapy for 9 to 10 years.  Headaches are most likely to occur when she travels.  Migraine pain is throbbing and is associated with nausea, photophobia and phonophobia.  Moving will worsen the pain.  When a more severe migraine occurs, she often takes self-administered IM Toradol with benefit.  She is on CPAP nightly for obstructive sleep apnea with benefit.  With CPAP she generally feels more alert and less tired and sleepy the next day.  Update 01/27/2018: She feels her migraines are about the same.    Botox has helped a lot.   She has been on it for about 9 years.   She still notes that headaches are worse with barometric changes and traveling.    For breakthrough migraines at a more severe she will sometimes do Toradol shots with benefit. When present, pain is throbbing and associated with photophobia, phonophobia and nausea.  Her OSA does well. She uses CPAP nightly. She feels better the next morning and day when she uses CPAP at night. She has lost some 10-12 pounds since the last visit and she feels a little better.  Her lower back pain is a little bit better with the weight loss.    Update 10/27/17:  She got her new machine through [facility].   She notes it seems to be working and she is compliant (AHI = 0.7 and compliance is 100%).    However, she feels the pressure is much lower than the other machine.    We discussed switching to Auto-PAP.      Her chronic migraines are doing well.   She did have a lot of migraines the first 2 -3 weeks of the cycle but then had very few the past 2- 2 1/2 months.  When present, headaches are associated with nausea, photophobia and phonophobia. Moving will make the headaches worse She notes some neck pain.  From 07/22/2017:     Chronic Migraine/neck pain:    Her headaches continued to do well with Botox therapy. She's been on Botox since 2010  which has led to a great reduction in her headache frequency from 29-30/30 a month to just a couple of months.      When she has a migraine, pain is usually worse on the right than the left. Headaches can be triggered by flying. When present, pain is throbbing and is associated with photophobia and phonophobia. With Botox, she has fewer headaches, especially the first 2 months of each cycle.      When she has a more severe migraine she takes Toradol injections or Maxalt pills. She has tolerated Botox very well and has not had any complications.   She is more likely to get as severe migraine during the last couple weeks of each cycle.  Migraine history:  She has a long history of chronic migraines that were occurring almost every day (25-28 days/month) for 4 or more hours a day. Multiple medications were tried for prophylactic and abortive therapies with suboptimal response.    In the past, she has failed multiple medications including:  Antiepileptics (Topamax, Keppra, zonisamide), anti-depressants (Cymbalta, Prozac), multiple NSAID's, betas blockers (Inderal, atenolol), muscle relaxants (soma, tizanidine, cyclobenzaprine) in various combinations.   Once the headache occurs, she has tried triptan's with incomplete benefit much of the time. She has also tried opiates.   IM Toradol has been most effective when one occurs.  Neck pain:  She gets a migraine she will have neck pain and she also notes neck pain at times. Botox has helped the neck pain as well. She has no pain radiating into the arms. MRI on 09/12/2014 showed mild degenerative changes at C5-C6 and C6-C7 with no definite nerve root compression.       OSA:  She continues to use her CPAP on a nightly basis. She has trouble sleeping without CPAP and she takes it with her when she travels most of the time. She has had some issues with the machine and it is time for her to get a new one.   She feels less fatigue when she uses her CPAP the next day.  She  deneis much dayitme hypersomnia.   She tolerates it well.    Abnormal MRI/?MS:  She was diagnosed with MS in the past and was on interferon therapy for several years.   I "undiagnosed her" around 2010 and she got a second opinion from Dr. Terie Purser who also concurred.  MRI shows small round foci predominantly in the subcortical and deep white matter in a nonspecific manner.  Her last MRI of the brain was in 2016 and was stable compared to the previous ones.  She had had paresthesias and fatigue in the past that contributed to her diagnosis.   We have discussed that she has been stable times many years or therapy that MS is significantly less likely.   REVIEW OF SYSTEMS:  Constitutional: No fevers, chills, sweats, or change in appetite Eyes: No visual changes, double vision, eye pain Ear, nose and throat: No hearing loss, ear pain, nasal congestion, sore throat Cardiovascular: No chest pain, palpitations Respiratory:  see above.  She has OSA. GastrointestinaI: No nausea, vomiting, diarrhea, abdominal pain, fecal incontinence Genitourinary:  No dysuria, urinary retention or frequency.  No nocturia.   Dysfunctional uterine bleeding Musculoskeletal:  notes low back pain that has been worse the last couple months and neck pain that is helped by Botox Integumentary: No rash, pruritus, skin lesions Neurological: as above Psychiatric: No depression at this time.  No anxiety Endocrine: No palpitations, diaphoresis, change in appetite, change in weigh or increased thirst Hematologic/Lymphatic:   She has anemia due to dysfunctional uterine bleeding. Allergic/Immunologic: No itchy/runny eyes, nasal congestion, recent allergic reactions, rashes  ALLERGIES: Allergies  Allergen Reactions   Cefaclor Swelling and Rash     neck swelling   Prochlorperazine Maleate Rash and Other (See Comments)     jittery;tachycardia   Bupropion Anxiety   Sulfamethoxazole-Trimethoprim Nausea Only   Tessalon Perles  [Benzonatate] Rash    HOME MEDICATIONS: Outpatient Medications Prior to Visit  Medication Sig Dispense Refill   atenolol (TENORMIN) 25 MG tablet TAKE 1 TABLET BY MOUTH EVERY DAY 90 tablet 3   Botulinum Toxin Type A (BOTOX) 200 UNITS SOLR MD to inject every 3 mos. for  migraine headaches. 1 each 3   ketorolac (TORADOL) 60 MG/2ML SOLN injection Inject up to 2 mL (60 mg) prn headache.  No more than 2 mL/day or 8 mL/month 8 mL 5   meloxicam (MOBIC) 15 MG tablet TAKE 1 TABLET BY MOUTH EVERY DAY 30 tablet 1   metFORMIN (GLUCOPHAGE-XR) 500 MG 24 hr tablet TAKE 1 TABLET BY MOUTH EVERY DAY WITH BREAKFAST  0   Multiple Vitamins-Minerals (MULTIVITAMIN WITH MINERALS) tablet Take by mouth.     NEEDLE, DISP, 25 G 25G X 1-1/2" MISC USE AS DIRECTED FOR KETOROLAC INJECTION 4 each 5   ondansetron (ZOFRAN-ODT) 8 MG disintegrating tablet Take 1 tablet (8 mg total) by mouth every 8 (eight) hours as needed for nausea or vomiting. 30 tablet 2   oxyCODONE-acetaminophen (PERCOCET) 10-325 MG tablet Take 1 tablet by mouth every 8 (eight) hours as needed for pain. 1 pill every 4-6 hours as needed for breakthrough pain as needed 90 tablet 0   rizatriptan (MAXALT-MLT) 10 MG disintegrating tablet TAKE 1 TABLET BY MOUTH AS NEEDED FOR MIGRAINE. MAY REPEAT IN 2 HOURS IF NEEDED 12 tablet 9   tiZANidine (ZANAFLEX) 4 MG tablet TAKE 1 TABLET BY MOUTH THREE TIMES A DAY 270 tablet 1   YALE DISP NEEDLES 25GX1-1/2" 25G X 1-1/2" MISC Use for IM injections as needed 18 each 3   pantoprazole (PROTONIX) 40 MG tablet Take 40 mg by mouth 2 (two) times daily.      No facility-administered medications prior to visit.     PAST MEDICAL HISTORY: Past Medical History:  Diagnosis Date   Headache    Hypertension    Vision abnormalities     PAST SURGICAL HISTORY: Past Surgical History:  Procedure Laterality Date   SINUS IRRIGATION      FAMILY HISTORY: Family History  Problem Relation Age of Onset   Breast cancer  Mother    Melanoma Mother    Atrial fibrillation Mother    Supraventricular tachycardia Mother    Heart attack Father    Hypertension Father    Hyperlipidemia Father     SOCIAL HISTORY:  Social History   Socioeconomic History   Marital status: Single    Spouse name: Not on file   Number of children: Not on file   Years of education: Not on file   Highest education level: Not on file  Occupational History   Not on file  Social Needs   Financial resource strain: Not on file   Food insecurity    Worry: Not on file    Inability: Not on file   Transportation needs    Medical: Not on file    Non-medical: Not on file  Tobacco Use   Smoking status: Never Smoker   Smokeless tobacco: Never Used  Substance and Sexual Activity   Alcohol use: No    Alcohol/week: 0.0 standard drinks    Comment: Rare   Drug use: No   Sexual activity: Not on file  Lifestyle   Physical activity    Days per week: Not on file    Minutes per session: Not on file   Stress: Not on file  Relationships   Social connections    Talks on phone: Not on file    Gets together: Not on file    Attends religious service: Not on file    Active member of club or organization: Not on file    Attends meetings of clubs or organizations: Not on file    Relationship  status: Not on file   Intimate partner violence    Fear of current or ex partner: Not on file    Emotionally abused: Not on file    Physically abused: Not on file    Forced sexual activity: Not on file  Other Topics Concern   Not on file  Social History Narrative   Not on file     PHYSICAL EXAM  Vitals:   06/05/19 0933  BP: 130/80  Pulse: 84  Temp: 98 F (36.7 C)  Weight: 298 lb 3.2 oz (135.3 kg)  Height: 5\' 6"  (1.676 m)    Body mass index is 48.13 kg/m.   General: The patient is well-developed and well-nourished and in no acute distress   Musculoskeletal:   The neck has a good range of motion.  She has  mild tenderness in the occiput.  Neurologic Exam  Mental status: The patient is alert and oriented x 3 at the time of the examination.  Speech is normal.  Cranial nerves: Extraocular movements are full.  Facial strength and sensation was normal.  Trapezius strength was normal.  No obvious hearing deficits are noted.  Motor:   Muscle tone and bulk is normal. She has normal strength.    Gait and station: Station and gait are normal.  Romberg is negative.  Tandem gait is normal.     DIAGNOSTIC DATA (LABS, IMAGING, TESTING) - I reviewed patient records, labs, notes, testing and imaging myself where available.     ASSESSMENT AND PLAN    1. Chronic migraine   2. Obstructive sleep apnea   3. Facet hypertrophy of lumbar region      1.    Inject 190 units Botox (10 units wasted) :   Frontalis (5 U x 4), nasalis/corrugators (5 U x 3), temporalis  (5 U x 9 - 5R, 4L), occipitalis (5 U x 6), splenius capitis (15 U x 2), trapezius (15 U x 2), C6C7 paraspinal muscles (10 U x 2), wasted (10 U) 2.    She will use Maxalt or Percocet for breakthrough migraines.  Hopefully she will be able to get intramuscular Toradol from the drugstore again since that was the most helpful for her.   3.   Continue to use CPAP nightly for OSA. 4.   Stay active and exercise as tolerated.   We discussed safety during the COVID-19 pandemic using CDC guidelines 5.   Return to see me in about 3 months for the next Botox injection, or sooner if she has new or worsening neurologic symptoms.    Shadd Dunstan A. Felecia Shelling, MD, PhD 6/76/7209, 47:09 PM Certified in Neurology, Clinical Neurophysiology, Sleep Medicine, Pain Medicine and Neuroimaging  [facility] [address], [place], [place] [address] [phone]

## 2019-07-12 ENCOUNTER — Other Ambulatory Visit: Payer: Self-pay | Admitting: Neurology

## 2019-07-12 ENCOUNTER — Telehealth: Payer: Self-pay | Admitting: Neurology

## 2019-07-12 NOTE — Telephone Encounter (Signed)
[  9:33 AM] Pamela Murphy     good morning! Can one of you call Pamela Murphy dob 2071 when you have a chance and schedule her a f/u around 09/05/19 for next botox inj and let danielle know? She does not have an appt scheduled since she was last seen 06/05/19  Pt needs botox appt on or after 09/05/19 per Terrence Dupont

## 2019-07-12 NOTE — Telephone Encounter (Signed)
I called and scheduled the patient she made me aware she is getting new insurance before her next apt. She will call us with the ID number and policy once she gets it. DW

## 2019-08-09 ENCOUNTER — Other Ambulatory Visit: Payer: Self-pay | Admitting: Neurology

## 2019-08-30 NOTE — Telephone Encounter (Signed)
Patient stated that she was told her [employer] would stay active through October, I will call to check this.   I called to confirm the patients policy is active through October ref#[id number]

## 2019-09-07 ENCOUNTER — Encounter: Payer: Self-pay | Admitting: Neurology

## 2019-09-07 ENCOUNTER — Other Ambulatory Visit: Payer: Self-pay

## 2019-09-07 ENCOUNTER — Other Ambulatory Visit: Payer: Self-pay | Admitting: Neurology

## 2019-09-07 ENCOUNTER — Ambulatory Visit: Payer: BC Managed Care – PPO | Admitting: Neurology

## 2019-09-07 VITALS — BP 130/78 | HR 79 | Temp 97.2°F | Ht 66.0 in | Wt 292.8 lb

## 2019-09-07 DIAGNOSIS — M542 Cervicalgia: Secondary | ICD-10-CM | POA: Diagnosis not present

## 2019-09-07 DIAGNOSIS — M47816 Spondylosis without myelopathy or radiculopathy, lumbar region: Secondary | ICD-10-CM

## 2019-09-07 DIAGNOSIS — G43709 Chronic migraine without aura, not intractable, without status migrainosus: Secondary | ICD-10-CM | POA: Diagnosis not present

## 2019-09-07 DIAGNOSIS — IMO0002 Reserved for concepts with insufficient information to code with codable children: Secondary | ICD-10-CM

## 2019-09-07 DIAGNOSIS — M5442 Lumbago with sciatica, left side: Secondary | ICD-10-CM

## 2019-09-07 DIAGNOSIS — G4733 Obstructive sleep apnea (adult) (pediatric): Secondary | ICD-10-CM

## 2019-09-07 NOTE — Progress Notes (Signed)
[facility]  PATIENT: Pamela Murphy DOB: June 06, 1971 REASON FOR VISIT: Chronic Migraine headaches   HISTORICAL  CHIEF COMPLAINT:  Chief Complaint  Patient presents with   Follow-up    RM 13, alone . Last seen 06/05/2019. Here for botox for migraines. Had ablation surgery last year and pain in leg has now come back about 3 weeks ago. She does not take anything for this.    Botulinum Toxin Injection    [id number]. [id number], Expiration: 04/2022, [id number].    Sleep Apnea    Printed download from [facility]    HISTORY OF PRESENT ILLNESS:  Pamela Murphy is a 48 y.o. woman with a chronic migraine disorder, obstructive sleep apnea and abnormal MRI of the brain.   Update 09/07/19: Her headaches are doing much better with only a couple in the last month.   She has been on Botox x many years with benefit now.   Botox infusions is also helped the neck pain.  When she does get a migraine headache, there is nausea, photophobia and phonophobia.  She has OSA and uses CPAP nightly.  Compliance by D/L is 100% and efficacy excellent at AHI=1.3.  She sleeps much better when she uses CPAP.  She feels refreshed in the morning.  Her back pain had done very well since the RFA of the medial branch nerves October 2019 but pain has come back over the last 3 weeks.  We discussed RFA usually lasts about a year so I will refer her back to Dr. Jola Baptist for another procedure.  I personally reviewed her last MRI.  It shows left greater than right facet hypertrophy at L4-L5 and L5-S1.   Update 06/05/2019: She has a severe migraine right now.   Usually Toradol helps in this case, but she can't get it from her pharmacy anymore.   Currently she has nausea and photophobia and phonophobia.  Moving worsens the pain.  Botox has helped reduce the number of migraines (she used to have daily migraine).  She has not had adequate control with oral options.  Her back pain continues to do very well  since the RFA of the medial branch nerves late 2019  She has sleep apnea.  She sleeps well most nights but the last couple nights has not slept as well due to her headache.  A download today showed 100% compliance and a 30-day AHI of 1.4 which is excellent.  Update 02/27/2019: She feels her migraines did well for close to 3 months but they have worsened in the last couple weeks.  They are now occurring daily again.  In general, Botox has greatly helped her chronic migraine headache frequency and intensity.  She usually goes 2 to 3 months with only a few headaches.  When present, she has throbbing pain associated with nausea, photophobia and sometimes vomiting.  Moving will make the pain worse.  She has not been able to get intramuscular Toradol shots.  Percocet will sometimes help.   Her back pain is much better since having the medial branch RFA.Marland Kitchen  She has OSA and uses CPAP nightly.  She sleeps well.    Update 11/11/2018: Her headaches were doing very well until a couple weeks ago and they have been much worse since.  She needed a trigger point injection earlier this week.  Since the trigger point injection had excepted that again.  In general the Botox has worked very well though or after the last couple weeks of the cycle headaches  returned.  When she gets a migraine she gets nausea, vomiting, photophobia and phonophobia.  Moving worsens the headache.  She uses CPAP nightly and sleeps well.  Her low back pain has resolved since she had medial branch radiofrequency ablation.  Update 08/04/2018: Her chronic migraine is generally doing well with Botox.  Is tolerating the injections well.  She has now been on therapy since 2009.  She still gets occasional headaches that can be severe, especially when she travels.  When the migraine occurs she gets throbbing pain associated with nausea, vomiting and photophobia.  Moving worsens the pain.  Self-administered IM Toradol shots have been the most  beneficial.  She is noting a lot of back pain.  She has left greater than right facet hypertrophy that is severe at L5-S1 and moderate at L4-L5.  She is scheduled to have a facet injection next week.  We discussed that if she gets a benefit from that then we could consider a medial branch blocks/RFA based on results  IMPRESSION: This MRI of the lumbar spine without contrast multilevel degenerative changes as detailed above.  Most significant findings are:: 1.    At L4-L5, there is moderate facet hypertrophy and a small joint effusion on the right, and left ligamentunm flavum hypertrophy.  There is no nerve root compression. 2.    At L5-S1, there is severe facet hypertrophy worse on the left and small joint effusion on the left.  There is no nerve root compression  She has obstructive sleep apnea and uses CPAP regularly.  She is tolerating it well.   Update 05/02/2018: Migraines continue to do fairly well on Botox.  She has tolerated the injections well.  She has been on the therapy for 9 to 10 years.  Headaches are most likely to occur when she travels.  Migraine pain is throbbing and is associated with nausea, photophobia and phonophobia.  Moving will worsen the pain.  When a more severe migraine occurs, she often takes self-administered IM Toradol with benefit.  She is on CPAP nightly for obstructive sleep apnea with benefit.  With CPAP she generally feels more alert and less tired and sleepy the next day.  Update 01/27/2018: She feels her migraines are about the same.    Botox has helped a lot.   She has been on it for about 9 years.   She still notes that headaches are worse with barometric changes and traveling.    For breakthrough migraines at a more severe she will sometimes do Toradol shots with benefit. When present, pain is throbbing and associated with photophobia, phonophobia and nausea.  Her OSA does well. She uses CPAP nightly. She feels better the next morning and day when she uses  CPAP at night. She has lost some 10-12 pounds since the last visit and she feels a little better.  Her lower back pain is a little bit better with the weight loss.    Update 10/27/17:  She got her new machine through [facility].   She notes it seems to be working and she is compliant (AHI = 0.7 and compliance is 100%).    However, she feels the pressure is much lower than the other machine.    We discussed switching to Auto-PAP.      Her chronic migraines are doing well.   She did have a lot of migraines the first 2 -3 weeks of the cycle but then had very few the past 2- 2 1/2 months.  When present, headaches  are associated with nausea, photophobia and phonophobia. Moving will make the headaches worse She notes some neck pain.  From 07/22/2017:     Chronic Migraine/neck pain:    Her headaches continued to do well with Botox therapy. She's been on Botox since 2010 which has led to a great reduction in her headache frequency from 29-30/30 a month to just a couple of months.      When she has a migraine, pain is usually worse on the right than the left. Headaches can be triggered by flying. When present, pain is throbbing and is associated with photophobia and phonophobia. With Botox, she has fewer headaches, especially the first 2 months of each cycle.      When she has a more severe migraine she takes Toradol injections or Maxalt pills. She has tolerated Botox very well and has not had any complications.   She is more likely to get as severe migraine during the last couple weeks of each cycle.  Migraine history:  She has a long history of chronic migraines that were occurring almost every day (25-28 days/month) for 4 or more hours a day. Multiple medications were tried for prophylactic and abortive therapies with suboptimal response.    In the past, she has failed multiple medications including:  Antiepileptics (Topamax, Keppra, zonisamide), anti-depressants (Cymbalta, Prozac), multiple NSAID's, betas blockers  (Inderal, atenolol), muscle relaxants (soma, tizanidine, cyclobenzaprine) in various combinations.   Once the headache occurs, she has tried triptan's with incomplete benefit much of the time. She has also tried opiates.   IM Toradol has been most effective when one occurs.  Neck pain:  She gets a migraine she will have neck pain and she also notes neck pain at times. Botox has helped the neck pain as well. She has no pain radiating into the arms. MRI on 09/12/2014 showed mild degenerative changes at C5-C6 and C6-C7 with no definite nerve root compression.       OSA:  She continues to use her CPAP on a nightly basis. She has trouble sleeping without CPAP and she takes it with her when she travels most of the time. She has had some issues with the machine and it is time for her to get a new one.   She feels less fatigue when she uses her CPAP the next day.  She deneis much dayitme hypersomnia.   She tolerates it well.    Abnormal MRI/?MS:  She was diagnosed with MS in the past and was on interferon therapy for several years.   I "undiagnosed her" around 2010 and she got a second opinion from Dr. Terie Purser who also concurred.  MRI shows small round foci predominantly in the subcortical and deep white matter in a nonspecific manner.  Her last MRI of the brain was in 2016 and was stable compared to the previous ones.  She had had paresthesias and fatigue in the past that contributed to her diagnosis.   We have discussed that she has been stable times many years or therapy that MS is significantly less likely.   REVIEW OF SYSTEMS:  Constitutional: No fevers, chills, sweats, or change in appetite Eyes: No visual changes, double vision, eye pain Ear, nose and throat: No hearing loss, ear pain, nasal congestion, sore throat Cardiovascular: No chest pain, palpitations Respiratory:  see above.  She has OSA. GastrointestinaI: No nausea, vomiting, diarrhea, abdominal pain, fecal incontinence Genitourinary:  No  dysuria, urinary retention or frequency.  No nocturia.   Dysfunctional uterine bleeding  Musculoskeletal:  notes low back pain that has been worse the last couple months and neck pain that is helped by Botox Integumentary: No rash, pruritus, skin lesions Neurological: as above Psychiatric: No depression at this time.  No anxiety Endocrine: No palpitations, diaphoresis, change in appetite, change in weigh or increased thirst Hematologic/Lymphatic:   She has anemia due to dysfunctional uterine bleeding. Allergic/Immunologic: No itchy/runny eyes, nasal congestion, recent allergic reactions, rashes  ALLERGIES: Allergies  Allergen Reactions   Cefaclor Swelling and Rash     neck swelling   Prochlorperazine Maleate Rash and Other (See Comments)     jittery;tachycardia   Bupropion Anxiety   Sulfamethoxazole-Trimethoprim Nausea Only   Tessalon Perles [Benzonatate] Rash    HOME MEDICATIONS: Outpatient Medications Prior to Visit  Medication Sig Dispense Refill   atenolol (TENORMIN) 25 MG tablet TAKE 1 TABLET BY MOUTH EVERY DAY 90 tablet 3   Botulinum Toxin Type A (BOTOX) 200 UNITS SOLR MD to inject every 3 mos. for migraine headaches. 1 each 3   clindamycin (CLEOCIN) 300 MG capsule Take 300 mg by mouth 4 (four) times daily.     indomethacin (INDOCIN) 25 MG capsule Take 1 capsule (25 mg total) by mouth 2 (two) times daily with a meal. 60 capsule 3   ketorolac (TORADOL) 60 MG/2ML SOLN injection Inject up to 2 mL (60 mg) prn headache.  No more than 2 mL/day or 8 mL/month 8 mL 5   meloxicam (MOBIC) 15 MG tablet TAKE 1 TABLET BY MOUTH EVERY DAY 30 tablet 1   metFORMIN (GLUCOPHAGE-XR) 500 MG 24 hr tablet TAKE 1 TABLET BY MOUTH EVERY DAY WITH BREAKFAST  0   Multiple Vitamins-Minerals (MULTIVITAMIN WITH MINERALS) tablet Take by mouth.     NEEDLE, DISP, 25 G 25G X 1-1/2" MISC USE AS DIRECTED FOR KETOROLAC INJECTION 4 each 5   ondansetron (ZOFRAN-ODT) 8 MG disintegrating tablet Take 1  tablet (8 mg total) by mouth every 8 (eight) hours as needed for nausea or vomiting. 30 tablet 2   oxyCODONE-acetaminophen (PERCOCET) 10-325 MG tablet Take 1 tablet by mouth every 8 (eight) hours as needed for pain. 1 pill every 4-6 hours as needed for breakthrough pain as needed 90 tablet 0   rizatriptan (MAXALT-MLT) 10 MG disintegrating tablet TAKE 1 TABLET BY MOUTH AS NEEDED FOR MIGRAINE. MAY REPEAT IN 2 HOURS IF NEEDED 12 tablet 11   tiZANidine (ZANAFLEX) 4 MG tablet TAKE 1 TABLET BY MOUTH THREE TIMES A DAY 270 tablet 1   YALE DISP NEEDLES 25GX1-1/2" 25G X 1-1/2" MISC Use for IM injections as needed 18 each 3   pantoprazole (PROTONIX) 40 MG tablet Take 40 mg by mouth 2 (two) times daily.      No facility-administered medications prior to visit.     PAST MEDICAL HISTORY: Past Medical History:  Diagnosis Date   Headache    Hypertension    Vision abnormalities     PAST SURGICAL HISTORY: Past Surgical History:  Procedure Laterality Date   SINUS IRRIGATION      FAMILY HISTORY: Family History  Problem Relation Age of Onset   Breast cancer Mother    Melanoma Mother    Atrial fibrillation Mother    Supraventricular tachycardia Mother    Heart attack Father    Hypertension Father    Hyperlipidemia Father     SOCIAL HISTORY:  Social History   Socioeconomic History   Marital status: Single    Spouse name: Not on file   Number of children:  Not on file   Years of education: Not on file   Highest education level: Not on file  Occupational History   Not on file  Social Needs   Financial resource strain: Not on file   Food insecurity    Worry: Not on file    Inability: Not on file   Transportation needs    Medical: Not on file    Non-medical: Not on file  Tobacco Use   Smoking status: Never Smoker   Smokeless tobacco: Never Used  Substance and Sexual Activity   Alcohol use: No    Alcohol/week: 0.0 standard drinks    Comment: Rare   Drug  use: No   Sexual activity: Not on file  Lifestyle   Physical activity    Days per week: Not on file    Minutes per session: Not on file   Stress: Not on file  Relationships   Social connections    Talks on phone: Not on file    Gets together: Not on file    Attends religious service: Not on file    Active member of club or organization: Not on file    Attends meetings of clubs or organizations: Not on file    Relationship status: Not on file   Intimate partner violence    Fear of current or ex partner: Not on file    Emotionally abused: Not on file    Physically abused: Not on file    Forced sexual activity: Not on file  Other Topics Concern   Not on file  Social History Narrative   Not on file     PHYSICAL EXAM  Vitals:   09/07/19 0900  BP: 130/78  Pulse: 79  Temp: (!) 97.2 F (36.2 C)  SpO2: 98%  Weight: 292 lb 12.8 oz (132.8 kg)  Height: 5\' 6"  (1.676 m)    Body mass index is 47.26 kg/m.   General: The patient is well-developed and well-nourished and in no acute distress   Musculoskeletal:   The neck has a good range of motion.  There is mild tenderness at the occiput.  Neurologic Exam  Mental status: The patient is alert and oriented x 3 at the time of the examination.  Speech is normal.  Cranial nerves: Extraocular movements are full.  Facial strength and sensation was normal.  Trapezius strength was normal.  No obvious hearing deficits are noted.  Motor:   Muscle tone and bulk is normal. She has normal strength.    Gait and station: Station and gait are normal.  Romberg is negative.  Tandem gait is normal.     DIAGNOSTIC DATA (LABS, IMAGING, TESTING) - I reviewed patient records, labs, notes, testing and imaging myself where available.     ASSESSMENT AND PLAN    1. Chronic migraine   2. Facet hypertrophy of lumbar region   3. Obstructive sleep apnea   4. Neck pain   5. Acute bilateral low back pain with left-sided sciatica       1.   Inject 190 units Botox (10 units wasted) :   Frontalis (5 U x 4), nasalis/corrugators (5 U x 3), temporalis  (5 U x 9 - 5R, 4L), occipitalis (5 U x 6), splenius capitis (15 U x 2), trapezius (15 U x 2), C6C7 paraspinal muscles (10 U x 2), wasted (10 U) 2.    If she has any breakthrough migraine headaches, she will use Maxalt, IM Toradol and/or Percocet as needed.  3.   Continue to use CPAP nightly for OSA. 4.   She had a very good benefit from facet/medial branch radiofrequency ablation about 1 year ago.  Pain has returned over the past 3 weeks.  I will refer her back to interventional radiology for another procedure. 5.   Return to see me in about 3 months for the next Botox injection, or sooner if she has new or worsening neurologic symptoms.    Hila Bolding A. Felecia Shelling, MD, PhD [id number], [id number] PM Certified in Neurology, Clinical Neurophysiology, Sleep Medicine, Pain Medicine and Neuroimaging  [facility] [address] [place], [place] [address] [phone]

## 2019-09-12 ENCOUNTER — Other Ambulatory Visit: Payer: Self-pay | Admitting: Neurology

## 2019-09-12 ENCOUNTER — Ambulatory Visit
Admission: RE | Admit: 2019-09-12 | Discharge: 2019-09-12 | Disposition: A | Discharge status: Home / Self Care | Payer: BC Managed Care – PPO | Source: Ambulatory Visit | Attending: Neurology | Admitting: Neurology

## 2019-09-12 ENCOUNTER — Other Ambulatory Visit: Payer: Self-pay

## 2019-09-12 DIAGNOSIS — M47816 Spondylosis without myelopathy or radiculopathy, lumbar region: Secondary | ICD-10-CM

## 2019-09-12 IMAGING — XA DG FACET JT INJ L OR S SPINE SINGLE LEVEL UNI
6 series · 6 of 6 positions shown · non-contrast
Comparison: none

Addendum:
CLINICAL DATA: Facet mediated left-sided low back pain. Good relief
from a severe after prior left L4-L5 and L5-S1 facet joint RFA.
Symptoms have recurred.

EXAM:
FLUOROSCOPICALLY GUIDED LEFT L3, LEFT L4 MEDIAL BRANCH BLOCKS AND
LEFT L5 DORSAL RAMUS BLOCK, IN PREPARATION FOR LEFT L4-L5 AND L5-S1
FACET RADIOFREQUENCY ABLATION
FLUOROSCOPY TIME:  Radiation Exposure Index (as provided by the
fluoroscopic device): 8.9 mGy
Number of Acquired Images:  0
TECHNIQUE: The procedure, risks, benefits, and alternatives were explained to
the patient. Questions regarding the procedure were encouraged and
answered. The patient understands and consents to the procedure.

[Series 1: ortho adipose · 1 of 1 slices shown (1 of 6)]
[im 1/1]
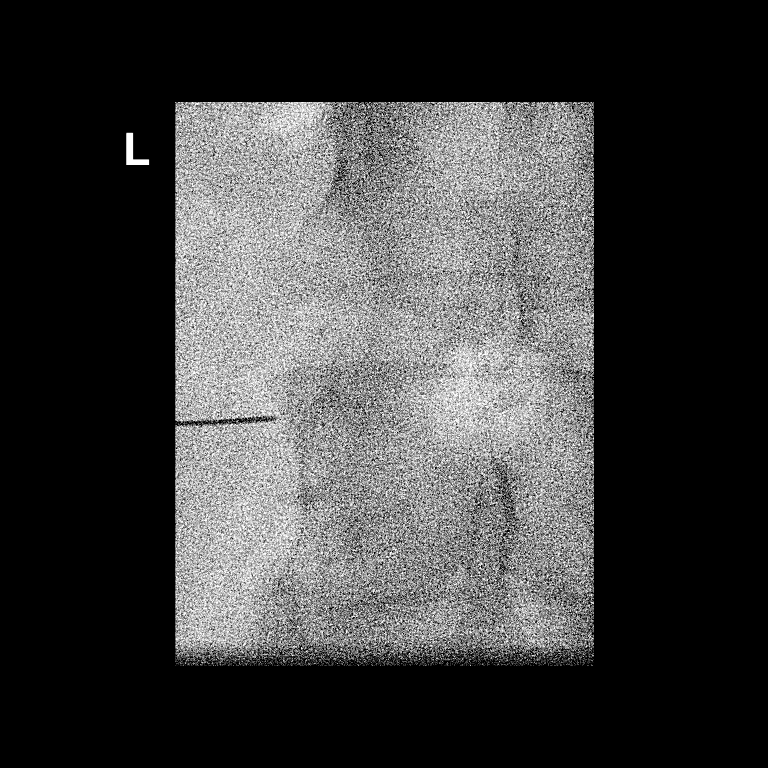

[Series 2: ortho adipose · 1 of 1 slices shown (2 of 6)]
[im 1/1]
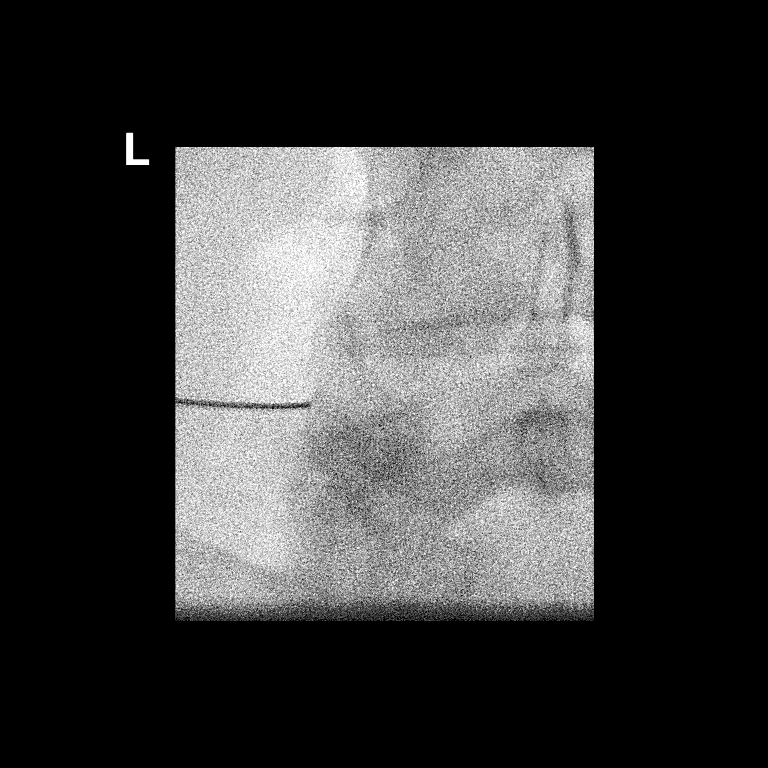

[Series 3: ortho adipose · 1 of 1 slices shown (3 of 6)]
[im 1/1]
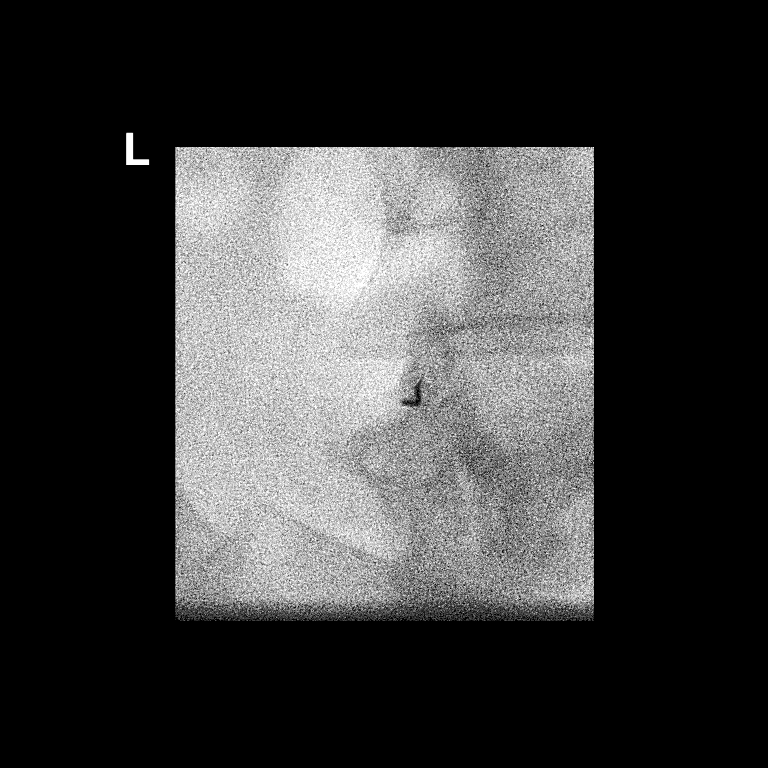

[Series 4: ortho adipose · 1 of 1 slices shown (4 of 6)]
[im 1/1]
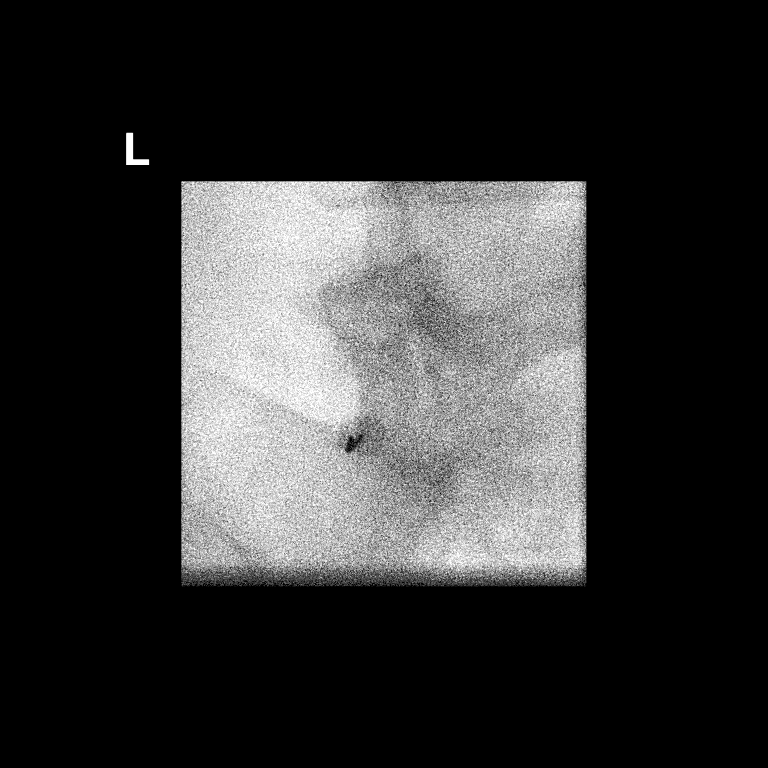

[Series 5: ortho adipose · 1 of 1 slices shown (5 of 6)]
[im 1/1]
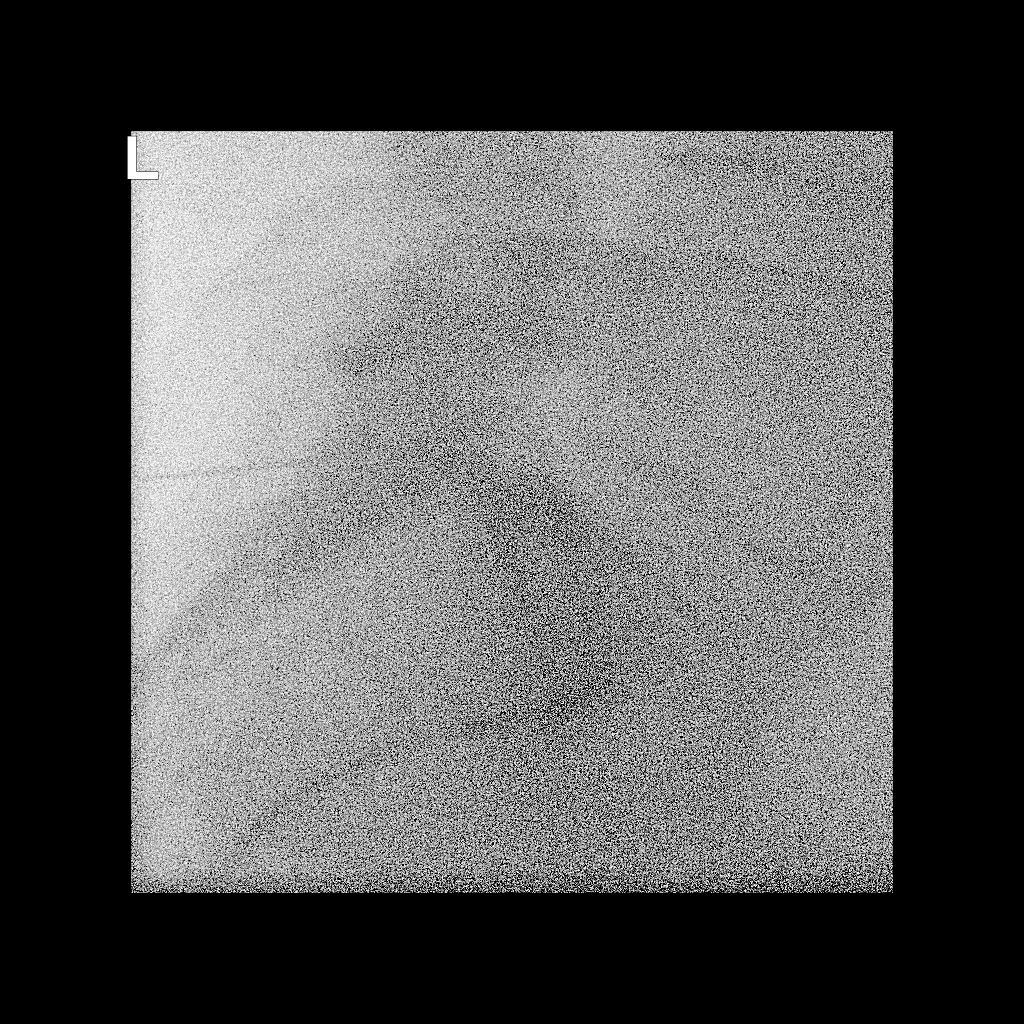

[Series 6: ortho adipose · 1 of 1 slices shown (6 of 6)]
[im 1/1]
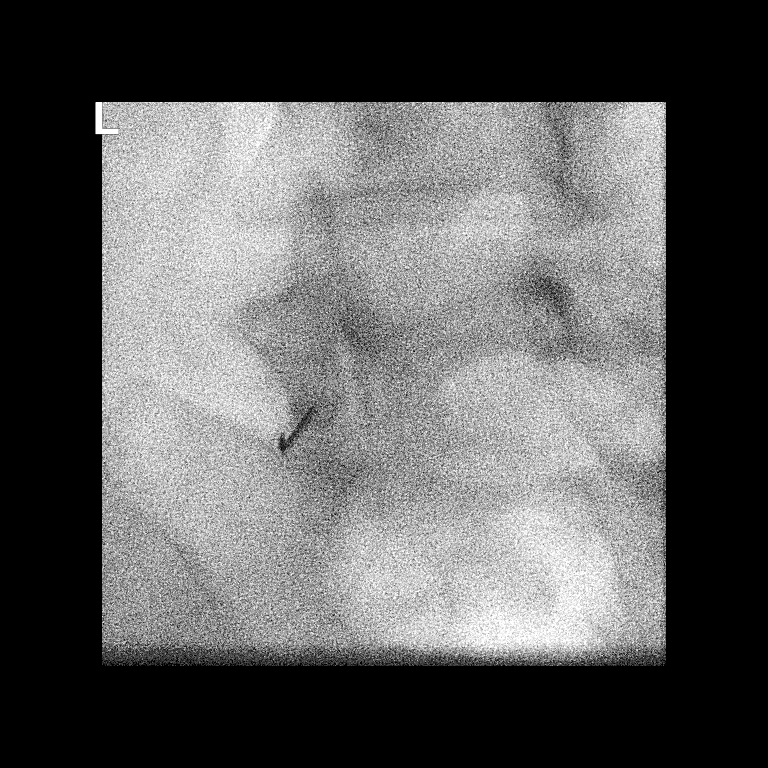

[6 of 6 positions shown; findings below may reference images not displayed]

An appropriate skin entry site was determined fluoroscopically and
marked. Site prepped with betadine, draped in usual sterile fashion,
and infiltrated locally with 1% lidocaine.

LEFT L3 MEDIAL BRANCH BLOCK: A posterior oblique approach was taken
to the junction of the superior articular process and transverse
process on the left at L4 using a 5 inch 22 gauge spinal needle, to
lie along the course of the left L3 medial branch nerve. 0.5 mL of
0.5 bupivacaine were injected.

LEFT L4 MEDIAL BRANCH BLOCK: A posterior oblique approach was taken
to the junction of the superior articular process and transverse
process on the left at L5 using a 5 inch 22 gauge spinal needle, to
lie along the course of the left L4 medial branch nerve. 0.5 mL of
0.5 bupivacaine were injected.

LEFT L5 DORSAL RAMUS BLOCK: A posterior oblique approach was taken
to the junction of the S1 superior articular process and sacral ala
on the left using a 5 inch 22 gauge spinal needle, to lie along the
course of the left L5 dorsal ramus. 0.5 mL of 0.5 bupivacaine were
injected.

The procedure was well-tolerated.

COMPLICATIONS:
None immediate.
IMPRESSION: Technically successful medial branch/dorsal ramus blocks in
preparation for left L4-L5 and L5-S1 facet radiofrequency ablation.

I will call the patient later today to assess for durable and
sustained pain relief. An addendum will be placed at that time.

ADDENDUM:
I called the patient at [DATE] p.m., approximately 7 hours after the
procedure. She has experienced significant improvement in her facet
mediated pain since the procedure. She is therefore a good candidate
for repeat radiofrequency ablation and will be scheduled at her
earliest convenience.

*** End of Addendum ***
An appropriate skin entry site was determined fluoroscopically and
marked. Site prepped with betadine, draped in usual sterile fashion,
and infiltrated locally with 1% lidocaine.

LEFT L3 MEDIAL BRANCH BLOCK: A posterior oblique approach was taken
to the junction of the superior articular process and transverse
process on the left at L4 using a 5 inch 22 gauge spinal needle, to
lie along the course of the left L3 medial branch nerve. 0.5 mL of
0.5 bupivacaine were injected.

LEFT L4 MEDIAL BRANCH BLOCK: A posterior oblique approach was taken
to the junction of the superior articular process and transverse
process on the left at L5 using a 5 inch 22 gauge spinal needle, to
lie along the course of the left L4 medial branch nerve. 0.5 mL of
0.5 bupivacaine were injected.

LEFT L5 DORSAL RAMUS BLOCK: A posterior oblique approach was taken
to the junction of the S1 superior articular process and sacral ala
on the left using a 5 inch 22 gauge spinal needle, to lie along the
course of the left L5 dorsal ramus. 0.5 mL of 0.5 bupivacaine were
injected.

The procedure was well-tolerated.

COMPLICATIONS:
None immediate.
IMPRESSION: Technically successful medial branch/dorsal ramus blocks in
preparation for left L4-L5 and L5-S1 facet radiofrequency ablation.

I will call the patient later today to assess for durable and
sustained pain relief. An addendum will be placed at that time.

## 2019-09-12 NOTE — Discharge Instructions (Signed)

## 2019-09-22 ENCOUNTER — Other Ambulatory Visit: Payer: Self-pay

## 2019-09-22 ENCOUNTER — Other Ambulatory Visit: Payer: Self-pay | Admitting: Neurology

## 2019-09-22 ENCOUNTER — Ambulatory Visit
Admission: RE | Admit: 2019-09-22 | Discharge: 2019-09-22 | Disposition: A | Discharge status: Home / Self Care | Payer: BC Managed Care – PPO | Source: Ambulatory Visit | Attending: Neurology | Admitting: Neurology

## 2019-09-22 DIAGNOSIS — M47816 Spondylosis without myelopathy or radiculopathy, lumbar region: Secondary | ICD-10-CM

## 2019-09-22 MED ORDER — METHYLPREDNISOLONE ACETATE 40 MG/ML INJ SUSP (RADIOLOG
120.0000 mg | Freq: Once | INTRAMUSCULAR | Status: AC
  Administered 2019-09-22: 120 mg via INTRALESIONAL

## 2019-09-22 MED ORDER — KETOROLAC TROMETHAMINE 30 MG/ML IJ SOLN
30.0000 mg | Freq: Once | INTRAMUSCULAR | Status: AC
  Administered 2019-09-22: 30 mg via INTRAVENOUS

## 2019-09-22 MED ORDER — SODIUM CHLORIDE 0.9 % IV SOLN
Freq: Once | INTRAVENOUS | Status: AC
  Administered 2019-09-22: 09:00:00 via INTRAVENOUS

## 2019-09-22 MED ORDER — FENTANYL CITRATE (PF) 100 MCG/2ML IJ SOLN: 25.0000 ug | INTRAMUSCULAR | Status: DC | PRN

## 2019-09-22 NOTE — Discharge Instructions (Signed)
Radio Frequency Ablation Post Procedure Discharge Instructions ° °1. May resume a regular diet and any medications that you routinely take (including pain medications). °2. No driving day of procedure. °3. Upon discharge go home and rest for at least 4 hours.  May use an ice pack as needed to injection sites on back. °4. Remove bandades later, today. ° ° ° °Please contact our office at [phone] for the following symptoms: ° °· Fever greater than 100 degrees °· Increased swelling, pain, or redness at injection site. ° ° °Thank you for visiting [facility]. °

## 2019-11-10 ENCOUNTER — Other Ambulatory Visit: Payer: Self-pay | Admitting: Neurology

## 2019-12-07 ENCOUNTER — Ambulatory Visit: Payer: BC Managed Care – PPO | Admitting: Neurology

## 2019-12-07 ENCOUNTER — Encounter: Payer: Self-pay | Admitting: Neurology

## 2019-12-07 ENCOUNTER — Other Ambulatory Visit: Payer: Self-pay

## 2019-12-07 ENCOUNTER — Telehealth: Payer: Self-pay | Admitting: *Deleted

## 2019-12-07 VITALS — BP 140/84 | HR 75 | Temp 96.6°F | Ht 66.0 in | Wt 284.0 lb

## 2019-12-07 DIAGNOSIS — R9089 Other abnormal findings on diagnostic imaging of central nervous system: Secondary | ICD-10-CM | POA: Diagnosis not present

## 2019-12-07 DIAGNOSIS — M47816 Spondylosis without myelopathy or radiculopathy, lumbar region: Secondary | ICD-10-CM

## 2019-12-07 DIAGNOSIS — G4733 Obstructive sleep apnea (adult) (pediatric): Secondary | ICD-10-CM | POA: Diagnosis not present

## 2019-12-07 DIAGNOSIS — G43709 Chronic migraine without aura, not intractable, without status migrainosus: Secondary | ICD-10-CM | POA: Diagnosis not present

## 2019-12-07 DIAGNOSIS — IMO0002 Reserved for concepts with insufficient information to code with codable children: Secondary | ICD-10-CM

## 2019-12-07 MED ORDER — OXYCODONE-ACETAMINOPHEN 10-325 MG PO TABS: 1.0000 | ORAL_TABLET | Freq: Three times a day (TID) | ORAL | 0 refills | Status: DC | PRN

## 2019-12-07 NOTE — Telephone Encounter (Signed)
Submitted PA oxycodone on CMM. [id number]. Waiting on determination.

## 2019-12-07 NOTE — Progress Notes (Signed)
[facility]  PATIENT: Pamela Murphy DOB: 04/09/1971 REASON FOR VISIT: Chronic Migraine headaches   HISTORICAL  CHIEF COMPLAINT:  Chief Complaint  Patient presents with  . Botulinum Toxin Injection    RM 12, alone. Here for botox for migraines. Lot: [id number]. Expiration: 07/2022. [place]: [id number].     HISTORY OF PRESENT ILLNESS:  Pamela Murphy is a 49 y.o. woman with a chronic migraine disorder, obstructive sleep apnea and abnormal MRI of the brain.   Update 12/07/2019: Headaches continue to do well on Botox.  She averages 2 headaches a month and they respond to indomethacin.  Occasionally she needs to take a Percocet if the response is not complete.  90 pills have lasted her more than 9 months.  Neck pain is also improved with Botox.  When she gets her rare migraine headaches she has nausea, photophobia and phonophobia.  She continues to use CPAP nightly for OSA.  The download today shows 100% compliance with excellent efficacy.  Her AHI equals 0.8.  She sleeps better when she uses CPAP than when she does not.  She feels refreshed in the morning.  Back pain is better since she had another radiofrequency ablation recently.  Update 09/07/19: Her headaches are doing much better with only a couple in the last month.   She has been on Botox x many years with benefit now.   Botox infusions is also helped the neck pain.  When she does get a migraine headache, there is nausea, photophobia and phonophobia.  She has OSA and uses CPAP nightly.  Compliance by D/L is 100% and efficacy excellent at AHI=1.3.  She sleeps much better when she uses CPAP.  She feels refreshed in the morning.  Her back pain had done very well since the RFA of the medial branch nerves October 2019 but pain has come back over the last 3 weeks.  We discussed RFA usually lasts about a year so I will refer her back to Dr. Jola Baptist for another procedure.  I personally reviewed her last MRI.  It shows left  greater than right facet hypertrophy at L4-L5 and L5-S1.   Update 06/05/2019: She has a severe migraine right now.   Usually Toradol helps in this case, but she can't get it from her pharmacy anymore.   Currently she has nausea and photophobia and phonophobia.  Moving worsens the pain.  Botox has helped reduce the number of migraines (she used to have daily migraine).  She has not had adequate control with oral options.  Her back pain continues to do very well since the RFA of the medial branch nerves late 2019  She has sleep apnea.  She sleeps well most nights but the last couple nights has not slept as well due to her headache.  A download today showed 100% compliance and a 30-day AHI of 1.4 which is excellent.  Update 02/27/2019: She feels her migraines did well for close to 3 months but they have worsened in the last couple weeks.  They are now occurring daily again.  In general, Botox has greatly helped her chronic migraine headache frequency and intensity.  She usually goes 2 to 3 months with only a few headaches.  When present, she has throbbing pain associated with nausea, photophobia and sometimes vomiting.  Moving will make the pain worse.  She has not been able to get intramuscular Toradol shots.  Percocet will sometimes help.   Her back pain is much better since having the medial  branch RFA.Marland Kitchen  She has OSA and uses CPAP nightly.  She sleeps well.    Update 11/11/2018: Her headaches were doing very well until a couple weeks ago and they have been much worse since.  She needed a trigger point injection earlier this week.  Since the trigger point injection had excepted that again.  In general the Botox has worked very well though or after the last couple weeks of the cycle headaches returned.  When she gets a migraine she gets nausea, vomiting, photophobia and phonophobia.  Moving worsens the headache.  She uses CPAP nightly and sleeps well.  Her low back pain has resolved since she had  medial branch radiofrequency ablation.  Update 08/04/2018: Her chronic migraine is generally doing well with Botox.  Is tolerating the injections well.  She has now been on therapy since 2009.  She still gets occasional headaches that can be severe, especially when she travels.  When the migraine occurs she gets throbbing pain associated with nausea, vomiting and photophobia.  Moving worsens the pain.  Self-administered IM Toradol shots have been the most beneficial.  She is noting a lot of back pain.  She has left greater than right facet hypertrophy that is severe at L5-S1 and moderate at L4-L5.  She is scheduled to have a facet injection next week.  We discussed that if she gets a benefit from that then we could consider a medial branch blocks/RFA based on results  IMPRESSION: This MRI of the lumbar spine without contrast multilevel degenerative changes as detailed above.  Most significant findings are:: 1.    At L4-L5, there is moderate facet hypertrophy and a small joint effusion on the right, and left ligamentunm flavum hypertrophy.  There is no nerve root compression. 2.    At L5-S1, there is severe facet hypertrophy worse on the left and small joint effusion on the left.  There is no nerve root compression  She has obstructive sleep apnea and uses CPAP regularly.  She is tolerating it well.   Update 05/02/2018: Migraines continue to do fairly well on Botox.  She has tolerated the injections well.  She has been on the therapy for 9 to 10 years.  Headaches are most likely to occur when she travels.  Migraine pain is throbbing and is associated with nausea, photophobia and phonophobia.  Moving will worsen the pain.  When a more severe migraine occurs, she often takes self-administered IM Toradol with benefit.  She is on CPAP nightly for obstructive sleep apnea with benefit.  With CPAP she generally feels more alert and less tired and sleepy the next day.  Update 01/27/2018: She feels her  migraines are about the same.    Botox has helped a lot.   She has been on it for about 9 years.   She still notes that headaches are worse with barometric changes and traveling.    For breakthrough migraines at a more severe she will sometimes do Toradol shots with benefit. When present, pain is throbbing and associated with photophobia, phonophobia and nausea.  Her OSA does well. She uses CPAP nightly. She feels better the next morning and day when she uses CPAP at night. She has lost some 10-12 pounds since the last visit and she feels a little better.  Her lower back pain is a little bit better with the weight loss.    Update 10/27/17:  She got her new machine through [facility].   She notes it seems to be working  and she is compliant (AHI = 0.7 and compliance is 100%).    However, she feels the pressure is much lower than the other machine.    We discussed switching to Auto-PAP.      Her chronic migraines are doing well.   She did have a lot of migraines the first 2 -3 weeks of the cycle but then had very few the past 2- 2 1/2 months.  When present, headaches are associated with nausea, photophobia and phonophobia. Moving will make the headaches worse She notes some neck pain.  From 07/22/2017:     Chronic Migraine/neck pain:    Her headaches continued to do well with Botox therapy. She's been on Botox since 2010 which has led to a great reduction in her headache frequency from 29-30/30 a month to just a couple of months.      When she has a migraine, pain is usually worse on the right than the left. Headaches can be triggered by flying. When present, pain is throbbing and is associated with photophobia and phonophobia. With Botox, she has fewer headaches, especially the first 2 months of each cycle.      When she has a more severe migraine she takes Toradol injections or Maxalt pills. She has tolerated Botox very well and has not had any complications.   She is more likely to get as severe migraine  during the last couple weeks of each cycle.  Migraine history:  She has a long history of chronic migraines that were occurring almost every day (25-28 days/month) for 4 or more hours a day. Multiple medications were tried for prophylactic and abortive therapies with suboptimal response.    In the past, she has failed multiple medications including:  Antiepileptics (Topamax, Keppra, zonisamide), anti-depressants (Cymbalta, Prozac), multiple NSAID's, betas blockers (Inderal, atenolol), muscle relaxants (soma, tizanidine, cyclobenzaprine) in various combinations.   Once the headache occurs, she has tried triptan's with incomplete benefit much of the time. She has also tried opiates.   IM Toradol has been most effective when one occurs.  Neck pain:  She gets a migraine she will have neck pain and she also notes neck pain at times. Botox has helped the neck pain as well. She has no pain radiating into the arms. MRI on 09/12/2014 showed mild degenerative changes at C5-C6 and C6-C7 with no definite nerve root compression.       OSA:  She continues to use her CPAP on a nightly basis. She has trouble sleeping without CPAP and she takes it with her when she travels most of the time. She has had some issues with the machine and it is time for her to get a new one.   She feels less fatigue when she uses her CPAP the next day.  She deneis much dayitme hypersomnia.   She tolerates it well.    Abnormal MRI/?MS:  She was diagnosed with MS in the past and was on interferon therapy for several years.   I "undiagnosed her" around 2010 and she got a second opinion from Dr. Terie Purser who also concurred.  MRI shows small round foci predominantly in the subcortical and deep white matter in a nonspecific manner.  Her last MRI of the brain was in 2016 and was stable compared to the previous ones.  She had had paresthesias and fatigue in the past that contributed to her diagnosis.   We have discussed that she has been stable times  many years or therapy that MS is  significantly less likely.   REVIEW OF SYSTEMS:  Constitutional: No fevers, chills, sweats, or change in appetite Eyes: No visual changes, double vision, eye pain Ear, nose and throat: No hearing loss, ear pain, nasal congestion, sore throat Cardiovascular: No chest pain, palpitations Respiratory:  see above.  She has OSA. GastrointestinaI: No nausea, vomiting, diarrhea, abdominal pain, fecal incontinence Genitourinary:  No dysuria, urinary retention or frequency.  No nocturia.   Dysfunctional uterine bleeding Musculoskeletal:  notes low back pain that has been worse the last couple months and neck pain that is helped by Botox Integumentary: No rash, pruritus, skin lesions Neurological: as above Psychiatric: No depression at this time.  No anxiety Endocrine: No palpitations, diaphoresis, change in appetite, change in weigh or increased thirst Hematologic/Lymphatic:   She has anemia due to dysfunctional uterine bleeding. Allergic/Immunologic: No itchy/runny eyes, nasal congestion, recent allergic reactions, rashes  ALLERGIES: Allergies  Allergen Reactions  . Cefaclor Swelling and Rash     neck swelling  . Prochlorperazine Maleate Rash and Other (See Comments)     jittery;tachycardia  . Bupropion Anxiety  . Sulfamethoxazole-Trimethoprim Nausea Only  . Tessalon Perles [Benzonatate] Rash    HOME MEDICATIONS: Outpatient Medications Prior to Visit  Medication Sig Dispense Refill  . atenolol (TENORMIN) 25 MG tablet TAKE 1 TABLET BY MOUTH EVERY DAY 90 tablet 3  . Botulinum Toxin Type A (BOTOX) 200 UNITS SOLR MD to inject every 3 mos. for migraine headaches. 1 each 3  . indomethacin (INDOCIN) 25 MG capsule Take 1 capsule (25 mg total) by mouth 2 (two) times daily with a meal. 60 capsule 3  . ketorolac (TORADOL) 60 MG/2ML SOLN injection Inject up to 2 mL (60 mg) prn headache.  No more than 2 mL/day or 8 mL/month 8 mL 5  . meloxicam (MOBIC) 15 MG tablet  TAKE 1 TABLET BY MOUTH EVERY DAY 30 tablet 1  . Multiple Vitamins-Minerals (MULTIVITAMIN WITH MINERALS) tablet Take by mouth.    Marland Kitchen NEEDLE, DISP, 25 G 25G X 1-1/2" MISC USE AS DIRECTED FOR KETOROLAC INJECTION 4 each 5  . ondansetron (ZOFRAN-ODT) 8 MG disintegrating tablet Take 1 tablet (8 mg total) by mouth every 8 (eight) hours as needed for nausea or vomiting. 30 tablet 2  . oxyCODONE-acetaminophen (PERCOCET) 10-325 MG tablet Take 1 tablet by mouth every 8 (eight) hours as needed for pain. 1 pill every 4-6 hours as needed for breakthrough pain as needed 90 tablet 0  . OZEMPIC, 0.25 OR 0.5 MG/DOSE, 2 MG/1.5ML SOPN Inject 0.25 Doses as directed once a week.     . rizatriptan (MAXALT-MLT) 10 MG disintegrating tablet TAKE 1 TABLET BY MOUTH AS NEEDED FOR MIGRAINE. MAY REPEAT IN 2 HOURS IF NEEDED 12 tablet 11  . tiZANidine (ZANAFLEX) 4 MG tablet TAKE 1 TABLET BY MOUTH THREE TIMES A DAY 270 tablet 1  . YALE DISP NEEDLES 25GX1-1/2" 25G X 1-1/2" MISC Use for IM injections as needed 18 each 3  . metFORMIN (GLUCOPHAGE-XR) 500 MG 24 hr tablet TAKE 1 TABLET BY MOUTH EVERY DAY WITH BREAKFAST  0  . empagliflozin (JARDIANCE) 25 MG TABS tablet Take 25 mg by mouth daily.    . pantoprazole (PROTONIX) 40 MG tablet Take 40 mg by mouth 2 (two) times daily.      No facility-administered medications prior to visit.    PAST MEDICAL HISTORY: Past Medical History:  Diagnosis Date  . Headache   . Hypertension   . Vision abnormalities     PAST SURGICAL HISTORY:  Past Surgical History:  Procedure Laterality Date  . SINUS IRRIGATION      FAMILY HISTORY: Family History  Problem Relation Age of Onset  . Breast cancer Mother   . Melanoma Mother   . Atrial fibrillation Mother   . Supraventricular tachycardia Mother   . Heart attack Father   . Hypertension Father   . Hyperlipidemia Father     SOCIAL HISTORY:  Social History   Socioeconomic History  . Marital status: Single    Spouse name: Not on file  .  Number of children: Not on file  . Years of education: Not on file  . Highest education level: Not on file  Occupational History  . Not on file  Tobacco Use  . Smoking status: Never Smoker  . Smokeless tobacco: Never Used  Substance and Sexual Activity  . Alcohol use: No    Alcohol/week: 0.0 standard drinks    Comment: Rare  . Drug use: No  . Sexual activity: Not on file  Other Topics Concern  . Not on file  Social History Narrative  . Not on file   Social Determinants of Health   Financial Resource Strain:   . Difficulty of Paying Living Expenses: Not on file  Food Insecurity:   . Worried About Charity fundraiser in the Last Year: Not on file  . Ran Out of Food in the Last Year: Not on file  Transportation Needs:   . Lack of Transportation (Medical): Not on file  . Lack of Transportation (Non-Medical): Not on file  Physical Activity:   . Days of Exercise per Week: Not on file  . Minutes of Exercise per Session: Not on file  Stress:   . Feeling of Stress : Not on file  Social Connections:   . Frequency of Communication with Friends and Family: Not on file  . Frequency of Social Gatherings with Friends and Family: Not on file  . Attends Religious Services: Not on file  . Active Member of Clubs or Organizations: Not on file  . Attends Archivist Meetings: Not on file  . Marital Status: Not on file  Intimate Partner Violence:   . Fear of Current or Ex-Partner: Not on file  . Emotionally Abused: Not on file  . Physically Abused: Not on file  . Sexually Abused: Not on file     PHYSICAL EXAM  Vitals:   12/07/19 0850  BP: 140/84  Pulse: 75  Temp: (!) 96.6 F (35.9 C)  Weight: 284 lb (128.8 kg)  Height: 5\' 6"  (1.676 m)    Body mass index is 45.84 kg/m.   General: The patient is well-developed and well-nourished and in no acute distress   Musculoskeletal:   The neck has good range of motion.  Negligible tenderness today.  Neurologic  Exam  Mental status: The patient is alert and oriented x 3 at the time of the examination.  Speech is normal.  Cranial nerves: Extraocular movements are full.  Facial strength and sensation was normal.  Trapezius strength was normal.  No obvious hearing deficits are noted.  Motor:   Muscle tone and bulk is normal. She has normal strength.    Gait and station: Station and gait are normal.         DIAGNOSTIC DATA (LABS, IMAGING, TESTING) - I reviewed patient records, labs, notes, testing and imaging myself where available.     ASSESSMENT AND PLAN    1. Chronic migraine   2. Obstructive sleep apnea  3. Facet hypertrophy of lumbar region   4. Abnormal brain MRI      1.   Inject 190 units Botox (10 units wasted) :   Frontalis (5 U x 4), nasalis/corrugators (5 U x 3), temporalis  (5 U x 9 - 5R, 4L), occipitalis (5 U x 6), splenius capitis (15 U x 2), trapezius (15 U x 2), C6C7 paraspinal muscles (10 U x 2), wasted (10 U) 2.    If she has any breakthrough migraine headaches, she will use indomethacin first and Maxalt and/or Percocet as needed.   3.   Continue to use CPAP nightly for OSA.  Current d/l looks great 4.   May need another lumbar facet/medial branch radiofrequency ablation about 1 year from now.     5.   Return to see me in about 3 months for the next Botox injection, or sooner if she has new or worsening neurologic symptoms.    Keo Schirmer A. Felecia Shelling, MD, PhD [id number], [id number] AM Certified in Neurology, Clinical Neurophysiology, Sleep Medicine, Pain Medicine and Neuroimaging  [facility] [address], [place], [place] [address] [phone]

## 2019-12-07 NOTE — Telephone Encounter (Addendum)
Received fax from [facility] that PA approved 12/07/19-06/05/20. PA# [place] [phone] non-grandfathered [id number]. Faxed notice of approval to [facility] at [phone]. Received fax confirmation.

## 2020-01-14 ENCOUNTER — Other Ambulatory Visit: Payer: Self-pay | Admitting: Neurology

## 2020-02-29 ENCOUNTER — Telehealth: Payer: Self-pay | Admitting: *Deleted

## 2020-02-29 NOTE — Telephone Encounter (Signed)
I called [employer] [phone] and spoke to Pamela Murphy.  He states that [phone] and [phone] are billable.  [id number] will require PA.  Eligible for B/B. Ref# for call is [id number]. I called [employer] [facility] [phone] and spoke to [place] who confirmed that previous PA has expired.  Initiated a new PA.  Ref# for call is [id number].  Form filled out and faxed to [phone] with clinical notes.

## 2020-02-29 NOTE — Telephone Encounter (Signed)
PA received via fax.  PA Ref# is [id number] Valid from 03/11/2020-09/07/2020

## 2020-03-11 ENCOUNTER — Other Ambulatory Visit: Payer: Self-pay

## 2020-03-11 ENCOUNTER — Ambulatory Visit: Payer: BC Managed Care – PPO | Admitting: Neurology

## 2020-03-11 ENCOUNTER — Encounter: Payer: Self-pay | Admitting: Neurology

## 2020-03-11 VITALS — BP 138/91 | HR 85 | Temp 97.7°F | Ht 66.0 in | Wt 264.0 lb

## 2020-03-11 DIAGNOSIS — M542 Cervicalgia: Secondary | ICD-10-CM

## 2020-03-11 DIAGNOSIS — R9089 Other abnormal findings on diagnostic imaging of central nervous system: Secondary | ICD-10-CM | POA: Diagnosis not present

## 2020-03-11 DIAGNOSIS — IMO0002 Reserved for concepts with insufficient information to code with codable children: Secondary | ICD-10-CM

## 2020-03-11 DIAGNOSIS — G4733 Obstructive sleep apnea (adult) (pediatric): Secondary | ICD-10-CM | POA: Diagnosis not present

## 2020-03-11 DIAGNOSIS — G43709 Chronic migraine without aura, not intractable, without status migrainosus: Secondary | ICD-10-CM

## 2020-03-11 MED ORDER — ATENOLOL 25 MG PO TABS: 25.0000 mg | ORAL_TABLET | Freq: Every day | ORAL | 3 refills | Status: DC

## 2020-03-11 MED ORDER — KETOROLAC TROMETHAMINE 60 MG/2ML IM SOLN
60.0000 mg | Freq: Once | INTRAMUSCULAR | Status: AC
  Administered 2020-03-11: 60 mg via INTRAMUSCULAR

## 2020-03-11 NOTE — Progress Notes (Signed)
[facility]  PATIENT: Pamela Murphy DOB: 26-Aug-1971 REASON FOR VISIT: Chronic Migraine headaches   HISTORICAL  CHIEF COMPLAINT:  Chief Complaint  Patient presents with  . Follow-up    Rm 13. Last seen 12/07/2019. She currently has a headache and is requesting a toradol shot. Here for botox for migraines. She is buy/bill. Received Pfizer covid-19 vaccine 01/2020.     HISTORY OF PRESENT ILLNESS:  Pamela Murphy is a 49 y.o. woman with a chronic migraine disorder, obstructive sleep apnea and abnormal MRI of the brain.   Update 03/11/2020: The headaches are doing well on Botox.  When she does get a migraine headaches indomethacin has helped the majority of them.  However, he did have the onset of a more severe headache today that was not helped by indomethacin.  She averages 2 headaches a month.    Occasionally she needs to take a Percocet if the response is not complete.  Neck pain is also improved with Botox.  When she gets her more typical migraine headaches she has nausea, photophobia and phonophobia.  She continues to use CPAP nightly for OSA.  Download last year showed 100% compliance with excellent efficacy.  Her AHI equals 0.8.  She sleeps better when she uses CPAP than when she does not.  She feels refreshed in the morning.  Back pain is better since she had another radiofrequency ablation.   Update 09/07/19: Her headaches are doing much better with only a couple in the last month.   She has been on Botox x many years with benefit now.   Botox infusions is also helped the neck pain.  When she does get a migraine headache, there is nausea, photophobia and phonophobia.  She has OSA and uses CPAP nightly.  Compliance by D/L is 100% and efficacy excellent at AHI=1.3.  She sleeps much better when she uses CPAP.  She feels refreshed in the morning.  Her back pain had done very well since the RFA of the medial branch nerves October 2019 but pain has come back over the  last 3 weeks.  We discussed RFA usually lasts about a year so I will refer her back to Dr. Jola Baptist for another procedure.  I personally reviewed her last MRI.  It shows left greater than right facet hypertrophy at L4-L5 and L5-S1.   Update 06/05/2019: She has a severe migraine right now.   Usually Toradol helps in this case, but she can't get it from her pharmacy anymore.   Currently she has nausea and photophobia and phonophobia.  Moving worsens the pain.  Botox has helped reduce the number of migraines (she used to have daily migraine).  She has not had adequate control with oral options.  Her back pain continues to do very well since the RFA of the medial branch nerves late 2019  She has sleep apnea.  She sleeps well most nights but the last couple nights has not slept as well due to her headache.  A download today showed 100% compliance and a 30-day AHI of 1.4 which is excellent.  Update 02/27/2019: She feels her migraines did well for close to 3 months but they have worsened in the last couple weeks.  They are now occurring daily again.  In general, Botox has greatly helped her chronic migraine headache frequency and intensity.  She usually goes 2 to 3 months with only a few headaches.  When present, she has throbbing pain associated with nausea, photophobia and sometimes vomiting.  Moving will make the pain worse.  She has not been able to get intramuscular Toradol shots.  Percocet will sometimes help.   Her back pain is much better since having the medial branch RFA.Marland Kitchen  She has OSA and uses CPAP nightly.  She sleeps well.    Update 11/11/2018: Her headaches were doing very well until a couple weeks ago and they have been much worse since.  She needed a trigger point injection earlier this week.  Since the trigger point injection had excepted that again.  In general the Botox has worked very well though or after the last couple weeks of the cycle headaches returned.  When she gets a migraine she  gets nausea, vomiting, photophobia and phonophobia.  Moving worsens the headache.  She uses CPAP nightly and sleeps well.  Her low back pain has resolved since she had medial branch radiofrequency ablation.  Update 08/04/2018: Her chronic migraine is generally doing well with Botox.  Is tolerating the injections well.  She has now been on therapy since 2009.  She still gets occasional headaches that can be severe, especially when she travels.  When the migraine occurs she gets throbbing pain associated with nausea, vomiting and photophobia.  Moving worsens the pain.  Self-administered IM Toradol shots have been the most beneficial.  She is noting a lot of back pain.  She has left greater than right facet hypertrophy that is severe at L5-S1 and moderate at L4-L5.  She is scheduled to have a facet injection next week.  We discussed that if she gets a benefit from that then we could consider a medial branch blocks/RFA based on results  IMPRESSION: This MRI of the lumbar spine without contrast multilevel degenerative changes as detailed above.  Most significant findings are:: 1.    At L4-L5, there is moderate facet hypertrophy and a small joint effusion on the right, and left ligamentunm flavum hypertrophy.  There is no nerve root compression. 2.    At L5-S1, there is severe facet hypertrophy worse on the left and small joint effusion on the left.  There is no nerve root compression  She has obstructive sleep apnea and uses CPAP regularly.  She is tolerating it well.   Update 05/02/2018: Migraines continue to do fairly well on Botox.  She has tolerated the injections well.  She has been on the therapy for 9 to 10 years.  Headaches are most likely to occur when she travels.  Migraine pain is throbbing and is associated with nausea, photophobia and phonophobia.  Moving will worsen the pain.  When a more severe migraine occurs, she often takes self-administered IM Toradol with benefit.  She is on CPAP  nightly for obstructive sleep apnea with benefit.  With CPAP she generally feels more alert and less tired and sleepy the next day.  Update 01/27/2018: She feels her migraines are about the same.    Botox has helped a lot.   She has been on it for about 9 years.   She still notes that headaches are worse with barometric changes and traveling.    For breakthrough migraines at a more severe she will sometimes do Toradol shots with benefit. When present, pain is throbbing and associated with photophobia, phonophobia and nausea.  Her OSA does well. She uses CPAP nightly. She feels better the next morning and day when she uses CPAP at night. She has lost some 10-12 pounds since the last visit and she feels a little better.  Her  lower back pain is a little bit better with the weight loss.    Update 10/27/17:  She got her new machine through [facility].   She notes it seems to be working and she is compliant (AHI = 0.7 and compliance is 100%).    However, she feels the pressure is much lower than the other machine.    We discussed switching to Auto-PAP.      Her chronic migraines are doing well.   She did have a lot of migraines the first 2 -3 weeks of the cycle but then had very few the past 2- 2 1/2 months.  When present, headaches are associated with nausea, photophobia and phonophobia. Moving will make the headaches worse She notes some neck pain.  From 07/22/2017:     Chronic Migraine/neck pain:    Her headaches continued to do well with Botox therapy. She's been on Botox since 2010 which has led to a great reduction in her headache frequency from 29-30/30 a month to just a couple of months.      When she has a migraine, pain is usually worse on the right than the left. Headaches can be triggered by flying. When present, pain is throbbing and is associated with photophobia and phonophobia. With Botox, she has fewer headaches, especially the first 2 months of each cycle.      When she has a more severe  migraine she takes Toradol injections or Maxalt pills. She has tolerated Botox very well and has not had any complications.   She is more likely to get as severe migraine during the last couple weeks of each cycle.  Migraine history:  She has a long history of chronic migraines that were occurring almost every day (25-28 days/month) for 4 or more hours a day. Multiple medications were tried for prophylactic and abortive therapies with suboptimal response.    In the past, she has failed multiple medications including:  Antiepileptics (Topamax, Keppra, zonisamide), anti-depressants (Cymbalta, Prozac), multiple NSAID's, betas blockers (Inderal, atenolol), muscle relaxants (soma, tizanidine, cyclobenzaprine) in various combinations.   Once the headache occurs, she has tried triptan's with incomplete benefit much of the time. She has also tried opiates.   IM Toradol has been most effective when one occurs.  Neck pain:  She gets a migraine she will have neck pain and she also notes neck pain at times. Botox has helped the neck pain as well. She has no pain radiating into the arms. MRI on 09/12/2014 showed mild degenerative changes at C5-C6 and C6-C7 with no definite nerve root compression.       OSA:  She continues to use her CPAP on a nightly basis. She has trouble sleeping without CPAP and she takes it with her when she travels most of the time. She has had some issues with the machine and it is time for her to get a new one.   She feels less fatigue when she uses her CPAP the next day.  She deneis much dayitme hypersomnia.   She tolerates it well.    Abnormal MRI/?MS:  She was diagnosed with MS in the past and was on interferon therapy for several years.   I "undiagnosed her" around 2010 and she got a second opinion from Dr. Terie Purser who also concurred.  MRI shows small round foci predominantly in the subcortical and deep white matter in a nonspecific manner.  Her last MRI of the brain was in 2016 and was stable  compared to the previous  ones.  She had had paresthesias and fatigue in the past that contributed to her diagnosis.   We have discussed that she has been stable times many years or therapy that MS is significantly less likely.   REVIEW OF SYSTEMS:  Constitutional: No fevers, chills, sweats, or change in appetite Eyes: No visual changes, double vision, eye pain Ear, nose and throat: No hearing loss, ear pain, nasal congestion, sore throat Cardiovascular: No chest pain, palpitations Respiratory:  see above.  She has OSA. GastrointestinaI: No nausea, vomiting, diarrhea, abdominal pain, fecal incontinence Genitourinary:  No dysuria, urinary retention or frequency.  No nocturia.   Dysfunctional uterine bleeding Musculoskeletal:  notes low back pain that has been worse the last couple months and neck pain that is helped by Botox Integumentary: No rash, pruritus, skin lesions Neurological: as above Psychiatric: No depression at this time.  No anxiety Endocrine: No palpitations, diaphoresis, change in appetite, change in weigh or increased thirst Hematologic/Lymphatic:   She has anemia due to dysfunctional uterine bleeding. Allergic/Immunologic: No itchy/runny eyes, nasal congestion, recent allergic reactions, rashes  ALLERGIES: Allergies  Allergen Reactions  . Cefaclor Swelling and Rash     neck swelling  . Prochlorperazine Maleate Rash and Other (See Comments)     jittery;tachycardia  . Bupropion Anxiety  . Sulfamethoxazole-Trimethoprim Nausea Only  . Tessalon Perles [Benzonatate] Rash    HOME MEDICATIONS: Outpatient Medications Prior to Visit  Medication Sig Dispense Refill  . atenolol (TENORMIN) 25 MG tablet TAKE 1 TABLET BY MOUTH EVERY DAY 90 tablet 3  . Botulinum Toxin Type A (BOTOX) 200 UNITS SOLR MD to inject every 3 mos. for migraine headaches. 1 each 3  . empagliflozin (JARDIANCE) 25 MG TABS tablet Take 25 mg by mouth daily.    . indomethacin (INDOCIN) 25 MG capsule Take 1  capsule (25 mg total) by mouth 2 (two) times daily with a meal. 60 capsule 3  . meloxicam (MOBIC) 15 MG tablet TAKE 1 TABLET BY MOUTH EVERY DAY 30 tablet 1  . Multiple Vitamins-Minerals (MULTIVITAMIN WITH MINERALS) tablet Take by mouth.    . ondansetron (ZOFRAN-ODT) 8 MG disintegrating tablet Take 1 tablet (8 mg total) by mouth every 8 (eight) hours as needed for nausea or vomiting. 30 tablet 2  . oxyCODONE-acetaminophen (PERCOCET) 10-325 MG tablet Take 1 tablet by mouth every 8 (eight) hours as needed for pain. 1 pill every 4-6 hours as needed for breakthrough pain as needed 90 tablet 0  . PNEUMOCOCCAL VAC POLYVALENT IJ Inject as directed. Received in January 2021    . rizatriptan (MAXALT-MLT) 10 MG disintegrating tablet TAKE 1 TABLET BY MOUTH AS NEEDED FOR MIGRAINE. MAY REPEAT IN 2 HOURS IF NEEDED 12 tablet 11  . Semaglutide, 1 MG/DOSE, (OZEMPIC, 1 MG/DOSE,) 4 MG/3ML SOPN Inject 1 mg into the skin every 7 (seven) days.    Marland Kitchen tiZANidine (ZANAFLEX) 4 MG tablet TAKE 1 TABLET BY MOUTH THREE TIMES A DAY 270 tablet 1  . NEEDLE, DISP, 25 G 25G X 1-1/2" MISC USE AS DIRECTED FOR KETOROLAC INJECTION 4 each 5  . OZEMPIC, 0.25 OR 0.5 MG/DOSE, 2 MG/1.5ML SOPN Inject 0.25 Doses as directed once a week.     Marland Kitchen YALE DISP NEEDLES 25GX1-1/2" 25G X 1-1/2" MISC Use for IM injections as needed 18 each 3  . pantoprazole (PROTONIX) 40 MG tablet Take 40 mg by mouth 2 (two) times daily.     Marland Kitchen ketorolac (TORADOL) 60 MG/2ML SOLN injection Inject up to 2 mL (60 mg) prn  headache.  No more than 2 mL/day or 8 mL/month (Patient not taking: Reported on 03/11/2020) 8 mL 5   No facility-administered medications prior to visit.    PAST MEDICAL HISTORY: Past Medical History:  Diagnosis Date  . Headache   . Hypertension   . Vision abnormalities     PAST SURGICAL HISTORY: Past Surgical History:  Procedure Laterality Date  . SINUS IRRIGATION      FAMILY HISTORY: Family History  Problem Relation Age of Onset  . Breast  cancer Mother   . Melanoma Mother   . Atrial fibrillation Mother   . Supraventricular tachycardia Mother   . Heart attack Father   . Hypertension Father   . Hyperlipidemia Father     SOCIAL HISTORY:  Social History   Socioeconomic History  . Marital status: Single    Spouse name: Not on file  . Number of children: Not on file  . Years of education: Not on file  . Highest education level: Not on file  Occupational History  . Not on file  Tobacco Use  . Smoking status: Never Smoker  . Smokeless tobacco: Never Used  Substance and Sexual Activity  . Alcohol use: No    Alcohol/week: 0.0 standard drinks    Comment: Rare  . Drug use: No  . Sexual activity: Not on file  Other Topics Concern  . Not on file  Social History Narrative  . Not on file   Social Determinants of Health   Financial Resource Strain:   . Difficulty of Paying Living Expenses:   Food Insecurity:   . Worried About Charity fundraiser in the Last Year:   . Arboriculturist in the Last Year:   Transportation Needs:   . Film/video editor (Medical):   Marland Kitchen Lack of Transportation (Non-Medical):   Physical Activity:   . Days of Exercise per Week:   . Minutes of Exercise per Session:   Stress:   . Feeling of Stress :   Social Connections:   . Frequency of Communication with Friends and Family:   . Frequency of Social Gatherings with Friends and Family:   . Attends Religious Services:   . Active Member of Clubs or Organizations:   . Attends Archivist Meetings:   Marland Kitchen Marital Status:   Intimate Partner Violence:   . Fear of Current or Ex-Partner:   . Emotionally Abused:   Marland Kitchen Physically Abused:   . Sexually Abused:      PHYSICAL EXAM  Vitals:   03/11/20 1334  BP: (!) 138/91  Pulse: 85  Temp: 97.7 F (36.5 C)  Weight: 264 lb (119.7 kg)  Height: 5\' 6"  (1.676 m)    Body mass index is 42.61 kg/m.   General: The patient is well-developed and well-nourished and in no acute  distress   Musculoskeletal:   The neck has good range of motion.  She has some tenderness in the back.  Range of motion was normal.  Neurologic Exam  Mental status: The patient is alert and oriented x 3 at the time of the examination.  Speech is normal.  Cranial nerves: Extraocular movements are full.  Facial strength and sensation was normal.  Trapezius strength was normal.  No obvious hearing deficits are noted.  Motor:   Muscle tone and bulk is normal. She has normal strength.    Gait and station: Station and gait are normal.        ASSESSMENT AND PLAN  1. Chronic migraine   2. Abnormal brain MRI   3. Obstructive sleep apnea   4. Neck pain      1.   Inject 190 units Botox (10 units wasted) :   Frontalis (5 U x 4), nasalis/corrugators (5 U x 3), temporalis  (5 U x 9 - 5R, 4L), occipitalis (5 U x 6), splenius capitis (15 U x 2), trapezius (15 U x 2), C6C7 paraspinal muscles (10 U x 2), wasted (10 U) 2.    For breakthrough migraine, she will use indomethacin and either Maxalt or Percocet as needed. 3.   Continue to use CPAP nightly for OSA.  4.    Return to see me in about 3 months for the next Botox injection, or sooner if she has new or worsening neurologic symptoms.    Elon Lomeli A. Felecia Shelling, MD, PhD [id number], [id number] PM Certified in Neurology, Clinical Neurophysiology, Sleep Medicine, Pain Medicine and Neuroimaging  [facility] [address] [place], [place] [address] [phone]

## 2020-03-21 ENCOUNTER — Encounter: Payer: Self-pay | Admitting: *Deleted

## 2020-03-21 ENCOUNTER — Ambulatory Visit: Payer: BC Managed Care – PPO | Admitting: Neurology

## 2020-03-21 ENCOUNTER — Other Ambulatory Visit: Payer: Self-pay

## 2020-03-21 ENCOUNTER — Encounter: Payer: Self-pay | Admitting: Neurology

## 2020-03-21 VITALS — BP 142/88 | HR 78 | Temp 98.1°F | Ht 66.0 in | Wt 264.5 lb

## 2020-03-21 DIAGNOSIS — H02402 Unspecified ptosis of left eyelid: Secondary | ICD-10-CM | POA: Insufficient documentation

## 2020-03-21 DIAGNOSIS — IMO0002 Reserved for concepts with insufficient information to code with codable children: Secondary | ICD-10-CM

## 2020-03-21 DIAGNOSIS — G43709 Chronic migraine without aura, not intractable, without status migrainosus: Secondary | ICD-10-CM

## 2020-03-21 NOTE — Progress Notes (Signed)
[facility]  PATIENT: Pamela Murphy DOB: 04-02-1971 REASON FOR VISIT: Chronic Migraine headaches   HISTORICAL  CHIEF COMPLAINT:  Chief Complaint  Patient presents with  . Follow-up    RM 12. Last seen 03/11/2020.     HISTORY OF PRESENT ILLNESS:  Pamela Murphy is a 49 y.o. woman with a chronic migraine disorder, obstructive sleep apnea and abnormal MRI of the brain.   Update 03/21/2020: She had more pain in the left forehead with a different type of pain than her typical migraine.  She also noted ptosis that began yesterday.  There is no diplopia.   No weakness in lower face and she can elevate the eyebrows some.    She had mild nausea but no vomiting.   Some photophobia.   Compared to yesterday, she is feeling better.  She also had type 2 NIDDM.    Update 03/11/2020: The headaches are doing well on Botox.  When she does get a migraine headaches indomethacin has helped the majority of them.  However, he did have the onset of a more severe headache today that was not helped by indomethacin.  She averages 2 headaches a month.    Occasionally she needs to take a Percocet if the response is not complete.  Neck pain is also improved with Botox.  When she gets her more typical migraine headaches she has nausea, photophobia and phonophobia.  She continues to use CPAP nightly for OSA.  Download last year showed 100% compliance with excellent efficacy.  Her AHI equals 0.8.  She sleeps better when she uses CPAP than when she does not.  She feels refreshed in the morning.  Back pain is better since she had another radiofrequency ablation.   Update 09/07/19: Her headaches are doing much better with only a couple in the last month.   She has been on Botox x many years with benefit now.   Botox infusions is also helped the neck pain.  When she does get a migraine headache, there is nausea, photophobia and phonophobia.  She has OSA and uses CPAP nightly.  Compliance by D/L is  100% and efficacy excellent at AHI=1.3.  She sleeps much better when she uses CPAP.  She feels refreshed in the morning.  Her back pain had done very well since the RFA of the medial branch nerves October 2019 but pain has come back over the last 3 weeks.  We discussed RFA usually lasts about a year so I will refer her back to Dr. Jola Baptist for another procedure.  I personally reviewed her last MRI.  It shows left greater than right facet hypertrophy at L4-L5 and L5-S1.   Update 06/05/2019: She has a severe migraine right now.   Usually Toradol helps in this case, but she can't get it from her pharmacy anymore.   Currently she has nausea and photophobia and phonophobia.  Moving worsens the pain.  Botox has helped reduce the number of migraines (she used to have daily migraine).  She has not had adequate control with oral options.  Her back pain continues to do very well since the RFA of the medial branch nerves late 2019  She has sleep apnea.  She sleeps well most nights but the last couple nights has not slept as well due to her headache.  A download today showed 100% compliance and a 30-day AHI of 1.4 which is excellent.  Update 02/27/2019: She feels her migraines did well for close to 3 months but  they have worsened in the last couple weeks.  They are now occurring daily again.  In general, Botox has greatly helped her chronic migraine headache frequency and intensity.  She usually goes 2 to 3 months with only a few headaches.  When present, she has throbbing pain associated with nausea, photophobia and sometimes vomiting.  Moving will make the pain worse.  She has not been able to get intramuscular Toradol shots.  Percocet will sometimes help.   Her back pain is much better since having the medial branch RFA.Pamela Murphy  She has OSA and uses CPAP nightly.  She sleeps well.    Update 11/11/2018: Her headaches were doing very well until a couple weeks ago and they have been much worse since.  She needed a  trigger point injection earlier this week.  Since the trigger point injection had excepted that again.  In general the Botox has worked very well though or after the last couple weeks of the cycle headaches returned.  When she gets a migraine she gets nausea, vomiting, photophobia and phonophobia.  Moving worsens the headache.  She uses CPAP nightly and sleeps well.  Her low back pain has resolved since she had medial branch radiofrequency ablation.  Update 08/04/2018: Her chronic migraine is generally doing well with Botox.  Is tolerating the injections well.  She has now been on therapy since 2009.  She still gets occasional headaches that can be severe, especially when she travels.  When the migraine occurs she gets throbbing pain associated with nausea, vomiting and photophobia.  Moving worsens the pain.  Self-administered IM Toradol shots have been the most beneficial.  She is noting a lot of back pain.  She has left greater than right facet hypertrophy that is severe at L5-S1 and moderate at L4-L5.  She is scheduled to have a facet injection next week.  We discussed that if she gets a benefit from that then we could consider a medial branch blocks/RFA based on results  IMPRESSION: This MRI of the lumbar spine without contrast multilevel degenerative changes as detailed above.  Most significant findings are:: 1.    At L4-L5, there is moderate facet hypertrophy and a small joint effusion on the right, and left ligamentunm flavum hypertrophy.  There is no nerve root compression. 2.    At L5-S1, there is severe facet hypertrophy worse on the left and small joint effusion on the left.  There is no nerve root compression  She has obstructive sleep apnea and uses CPAP regularly.  She is tolerating it well.   Update 05/02/2018: Migraines continue to do fairly well on Botox.  She has tolerated the injections well.  She has been on the therapy for 9 to 10 years.  Headaches are most likely to occur when  she travels.  Migraine pain is throbbing and is associated with nausea, photophobia and phonophobia.  Moving will worsen the pain.  When a more severe migraine occurs, she often takes self-administered IM Toradol with benefit.  She is on CPAP nightly for obstructive sleep apnea with benefit.  With CPAP she generally feels more alert and less tired and sleepy the next day.  Update 01/27/2018: She feels her migraines are about the same.    Botox has helped a lot.   She has been on it for about 9 years.   She still notes that headaches are worse with barometric changes and traveling.    For breakthrough migraines at a more severe she will sometimes do  Toradol shots with benefit. When present, pain is throbbing and associated with photophobia, phonophobia and nausea.  Her OSA does well. She uses CPAP nightly. She feels better the next morning and day when she uses CPAP at night. She has lost some 10-12 pounds since the last visit and she feels a little better.  Her lower back pain is a little bit better with the weight loss.    Update 10/27/17:  She got her new machine through [facility].   She notes it seems to be working and she is compliant (AHI = 0.7 and compliance is 100%).    However, she feels the pressure is much lower than the other machine.    We discussed switching to Auto-PAP.      Her chronic migraines are doing well.   She did have a lot of migraines the first 2 -3 weeks of the cycle but then had very few the past 2- 2 1/2 months.  When present, headaches are associated with nausea, photophobia and phonophobia. Moving will make the headaches worse She notes some neck pain.  From 07/22/2017:     Chronic Migraine/neck pain:    Her headaches continued to do well with Botox therapy. She's been on Botox since 2010 which has led to a great reduction in her headache frequency from 29-30/30 a month to just a couple of months.      When she has a migraine, pain is usually worse on the right than the  left. Headaches can be triggered by flying. When present, pain is throbbing and is associated with photophobia and phonophobia. With Botox, she has fewer headaches, especially the first 2 months of each cycle.      When she has a more severe migraine she takes Toradol injections or Maxalt pills. She has tolerated Botox very well and has not had any complications.   She is more likely to get as severe migraine during the last couple weeks of each cycle.  Migraine history:  She has a long history of chronic migraines that were occurring almost every day (25-28 days/month) for 4 or more hours a day. Multiple medications were tried for prophylactic and abortive therapies with suboptimal response.    In the past, she has failed multiple medications including:  Antiepileptics (Topamax, Keppra, zonisamide), anti-depressants (Cymbalta, Prozac), multiple NSAID's, betas blockers (Inderal, atenolol), muscle relaxants (soma, tizanidine, cyclobenzaprine) in various combinations.   Once the headache occurs, she has tried triptan's with incomplete benefit much of the time. She has also tried opiates.   IM Toradol has been most effective when one occurs.  Neck pain:  She gets a migraine she will have neck pain and she also notes neck pain at times. Botox has helped the neck pain as well. She has no pain radiating into the arms. MRI on 09/12/2014 showed mild degenerative changes at C5-C6 and C6-C7 with no definite nerve root compression.       OSA:  She continues to use her CPAP on a nightly basis. She has trouble sleeping without CPAP and she takes it with her when she travels most of the time. She has had some issues with the machine and it is time for her to get a new one.   She feels less fatigue when she uses her CPAP the next day.  She deneis much dayitme hypersomnia.   She tolerates it well.    Abnormal MRI/?MS:  She was diagnosed with MS in the past and was on interferon therapy for  several years.   I "undiagnosed  her" around 2010 and she got a second opinion from Dr. Terie Purser who also concurred.  MRI shows small round foci predominantly in the subcortical and deep white matter in a nonspecific manner.  Her last MRI of the brain was in 2016 and was stable compared to the previous ones.  She had had paresthesias and fatigue in the past that contributed to her diagnosis.   We have discussed that she has been stable times many years or therapy that MS is significantly less likely.   REVIEW OF SYSTEMS:  Constitutional: No fevers, chills, sweats, or change in appetite Eyes: No visual changes, double vision, eye pain Ear, nose and throat: No hearing loss, ear pain, nasal congestion, sore throat Cardiovascular: No chest pain, palpitations Respiratory:  see above.  She has OSA. GastrointestinaI: No nausea, vomiting, diarrhea, abdominal pain, fecal incontinence Genitourinary:  No dysuria, urinary retention or frequency.  No nocturia.   Dysfunctional uterine bleeding Musculoskeletal:  notes low back pain that has been worse the last couple months and neck pain that is helped by Botox Integumentary: No rash, pruritus, skin lesions Neurological: as above Psychiatric: No depression at this time.  No anxiety Endocrine: No palpitations, diaphoresis, change in appetite, change in weigh or increased thirst Hematologic/Lymphatic:   She has anemia due to dysfunctional uterine bleeding. Allergic/Immunologic: No itchy/runny eyes, nasal congestion, recent allergic reactions, rashes  ALLERGIES: Allergies  Allergen Reactions  . Cefaclor Swelling and Rash     neck swelling  . Prochlorperazine Maleate Rash and Other (See Comments)     jittery;tachycardia  . Bupropion Anxiety  . Sulfamethoxazole-Trimethoprim Nausea Only  . Tessalon Perles [Benzonatate] Rash    HOME MEDICATIONS: Outpatient Medications Prior to Visit  Medication Sig Dispense Refill  . atenolol (TENORMIN) 25 MG tablet Take 1 tablet (25 mg total) by mouth  daily. 90 tablet 3  . Botulinum Toxin Type A (BOTOX) 200 UNITS SOLR MD to inject every 3 mos. for migraine headaches. 1 each 3  . empagliflozin (JARDIANCE) 25 MG TABS tablet Take 25 mg by mouth daily.    . indomethacin (INDOCIN) 25 MG capsule Take 1 capsule (25 mg total) by mouth 2 (two) times daily with a meal. 60 capsule 3  . meloxicam (MOBIC) 15 MG tablet TAKE 1 TABLET BY MOUTH EVERY DAY 30 tablet 1  . Multiple Vitamins-Minerals (MULTIVITAMIN WITH MINERALS) tablet Take by mouth.    . ondansetron (ZOFRAN-ODT) 8 MG disintegrating tablet Take 1 tablet (8 mg total) by mouth every 8 (eight) hours as needed for nausea or vomiting. 30 tablet 2  . oxyCODONE-acetaminophen (PERCOCET) 10-325 MG tablet Take 1 tablet by mouth every 8 (eight) hours as needed for pain. 1 pill every 4-6 hours as needed for breakthrough pain as needed 90 tablet 0  . PNEUMOCOCCAL VAC POLYVALENT IJ Inject as directed. Received in January 2021    . rizatriptan (MAXALT-MLT) 10 MG disintegrating tablet TAKE 1 TABLET BY MOUTH AS NEEDED FOR MIGRAINE. MAY REPEAT IN 2 HOURS IF NEEDED 12 tablet 11  . Semaglutide, 1 MG/DOSE, (OZEMPIC, 1 MG/DOSE,) 4 MG/3ML SOPN Inject 1 mg into the skin every 7 (seven) days.    Pamela Murphy tiZANidine (ZANAFLEX) 4 MG tablet TAKE 1 TABLET BY MOUTH THREE TIMES A DAY 270 tablet 1  . pantoprazole (PROTONIX) 40 MG tablet Take 40 mg by mouth 2 (two) times daily.      No facility-administered medications prior to visit.    PAST MEDICAL HISTORY: Past Medical  History:  Diagnosis Date  . Headache   . Hypertension   . Vision abnormalities     PAST SURGICAL HISTORY: Past Surgical History:  Procedure Laterality Date  . SINUS IRRIGATION      FAMILY HISTORY: Family History  Problem Relation Age of Onset  . Breast cancer Mother   . Melanoma Mother   . Atrial fibrillation Mother   . Supraventricular tachycardia Mother   . Heart attack Father   . Hypertension Father   . Hyperlipidemia Father     SOCIAL  HISTORY:  Social History   Socioeconomic History  . Marital status: Single    Spouse name: Not on file  . Number of children: Not on file  . Years of education: Not on file  . Highest education level: Not on file  Occupational History  . Not on file  Tobacco Use  . Smoking status: Never Smoker  . Smokeless tobacco: Never Used  Substance and Sexual Activity  . Alcohol use: No    Alcohol/week: 0.0 standard drinks    Comment: Rare  . Drug use: No  . Sexual activity: Not on file  Other Topics Concern  . Not on file  Social History Narrative  . Not on file   Social Determinants of Health   Financial Resource Strain:   . Difficulty of Paying Living Expenses:   Food Insecurity:   . Worried About Charity fundraiser in the Last Year:   . Arboriculturist in the Last Year:   Transportation Needs:   . Film/video editor (Medical):   Pamela Murphy Lack of Transportation (Non-Medical):   Physical Activity:   . Days of Exercise per Week:   . Minutes of Exercise per Session:   Stress:   . Feeling of Stress :   Social Connections:   . Frequency of Communication with Friends and Family:   . Frequency of Social Gatherings with Friends and Family:   . Attends Religious Services:   . Active Member of Clubs or Organizations:   . Attends Archivist Meetings:   Pamela Murphy Marital Status:   Intimate Partner Violence:   . Fear of Current or Ex-Partner:   . Emotionally Abused:   Pamela Murphy Physically Abused:   . Sexually Abused:      PHYSICAL EXAM  Vitals:   03/21/20 1404  BP: (!) 142/88  Pulse: 78  Temp: 98.1 F (36.7 C)  Weight: 264 lb 8 oz (120 kg)  Height: 5\' 6"  (1.676 m)    Body mass index is 42.69 kg/m.   General: The patient is well-developed and well-nourished and in no acute distress   Musculoskeletal:   The neck has good range of motion.  She has some tenderness in the back.  Range of motion was normal.  Neurologic Exam  Mental status: The patient is alert and oriented x  3 at the time of the examination.  Speech is normal.  Cranial nerves: Extraocular movements are full.  She has mild left ptosis.  Facial strength is normal elsewhere.  Facial sensation is symmetric.  Trapezius strength was normal.  No obvious hearing deficits are noted.  Motor:   Muscle tone and bulk is normal. She has normal strength.    Gait and station: Station and gait are normal.        ASSESSMENT AND PLAN    1. Chronic migraine   2. Ptosis of left eyelid      1.   She had the onset of  ptosis on the left 8 or 9 days after Botox.  There is no diplopia, pupillary changes or other weakness in the face or elsewhere.  Therefore, the most likely explanation is spread from one of the Botox injection sites, probably the left corrugator.  I discussed with her that improvement is expected but could take several weeks or even as long as 2 months.  To prevent eyestrain, she could patch the eye intermittently while working on the computer. 2.    Continue other medications 3.   I let her know I was on call tomorrow and the weekend and she should call the office if she has any new or worsening neurologic symptoms.  4.    Return to see me in about 3 months for the next Botox injection, or sooner if she has new or worsening neurologic symptoms.    Katori Wirsing A. Felecia Shelling, MD, PhD [id number], [id number] PM Certified in Neurology, Clinical Neurophysiology, Sleep Medicine, Pain Medicine and Neuroimaging  [facility] [address], [place], [place] [address] [phone]

## 2020-04-28 ENCOUNTER — Other Ambulatory Visit: Payer: Self-pay | Admitting: Neurology

## 2020-05-30 ENCOUNTER — Other Ambulatory Visit: Payer: Self-pay | Admitting: Neurology

## 2020-06-12 ENCOUNTER — Other Ambulatory Visit: Payer: Self-pay | Admitting: *Deleted

## 2020-06-12 DIAGNOSIS — IMO0002 Reserved for concepts with insufficient information to code with codable children: Secondary | ICD-10-CM

## 2020-06-12 MED ORDER — BOTOX 200 UNITS IJ SOLR: INTRAMUSCULAR | 3 refills | Status: DC

## 2020-06-13 ENCOUNTER — Other Ambulatory Visit: Payer: Self-pay

## 2020-06-13 ENCOUNTER — Ambulatory Visit: Payer: BC Managed Care – PPO | Admitting: Neurology

## 2020-06-13 VITALS — BP 149/84 | HR 71 | Ht 66.0 in | Wt 258.0 lb

## 2020-06-13 DIAGNOSIS — G4733 Obstructive sleep apnea (adult) (pediatric): Secondary | ICD-10-CM | POA: Diagnosis not present

## 2020-06-13 DIAGNOSIS — M47816 Spondylosis without myelopathy or radiculopathy, lumbar region: Secondary | ICD-10-CM

## 2020-06-13 DIAGNOSIS — R9089 Other abnormal findings on diagnostic imaging of central nervous system: Secondary | ICD-10-CM

## 2020-06-13 DIAGNOSIS — IMO0002 Reserved for concepts with insufficient information to code with codable children: Secondary | ICD-10-CM

## 2020-06-13 DIAGNOSIS — G43709 Chronic migraine without aura, not intractable, without status migrainosus: Secondary | ICD-10-CM

## 2020-06-13 NOTE — Progress Notes (Signed)
[facility]  PATIENT: Pamela Murphy DOB: 22-Nov-1971 REASON FOR VISIT: Chronic Migraine headaches   HISTORICAL  CHIEF COMPLAINT:  Chief Complaint  Patient presents with  . Botulinum Toxin Injection    RM 12, alone. Last seen 03/21/2020. Migraines. 200U vialx1. Lot: [id number]. Exp: 02/2023. NDC: [phone]    HISTORY OF PRESENT ILLNESS:  Pamela Murphy is a 49 y.o. woman with a chronic migraine disorder, obstructive sleep apnea and abnormal MRI of the brain.   Update 06/14/2020: The headaches are doing well on Botox.  After the last series of injections she did note some eyelid drooping and we discussed putting a smaller amount in the forehead today.  She treats most of the migraine headaches with indomethacin with benefit.  Toradol had helped better but she is unable to get this filled at this time.  She averages 2 headaches a month.    Occasionally she needs to take a Percocet if the response is not complete.  Neck pain is also improved with Botox.  When she gets her more typical migraine headaches she has nausea, photophobia and phonophobia.  Additionally, she has OSA and uses CPAP.    Download last year showed 100% compliance with excellent efficacy (AHI of 0.8).  She sleeps better when she uses CPAP than when she does not.  She feels refreshed in the morning.  Back pain improved with radiofrequency ablation but she still has some back pain at times.  She had radiofrequency ablation by Dr. Jola Baptist October 2019.  MRI of the lumbar spine has shown left greater than right facet hypertrophy at L4-L5 and L5-S1.   Migraine history:  She has a long history of chronic migraines that were occurring almost every day (25-28 days/month) for 4 or more hours a day. Multiple medications were tried for prophylactic and abortive therapies with suboptimal response.    In the past, she has failed multiple medications including:  Antiepileptics (Topamax, Keppra, zonisamide),  anti-depressants (Cymbalta, Prozac), multiple NSAID's, betas blockers (Inderal, atenolol), muscle relaxants (soma, tizanidine, cyclobenzaprine) in various combinations.   Once the headache occurs, she has tried triptan's with incomplete benefit much of the time. She has also tried opiates.   IM Toradol has been most effective when one occurs.  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   I "undiagnosed her" around 2010 and she got a second opinion from Dr. Terie Purser who also concurred.  MRI shows small round foci predominantly in the subcortical and deep white matter in a nonspecific manner.  Her last MRI of the brain was in 2016 and was stable compared to the previous ones.  She had had paresthesias and fatigue in the past that contributed to her diagnosis.   We have discussed that she has been stable times many years or therapy that MS is significantly less likely.   REVIEW OF SYSTEMS:  Constitutional: No fevers, chills, sweats, or change in appetite Eyes: No visual changes, double vision, eye pain Ear, nose and throat: No hearing loss, ear pain, nasal congestion, sore throat Cardiovascular: No chest pain, palpitations Respiratory:  see above.  She has OSA. GastrointestinaI: No nausea, vomiting, diarrhea, abdominal pain, fecal incontinence Genitourinary:  No dysuria, urinary retention or frequency.  No nocturia.   Dysfunctional uterine bleeding Musculoskeletal:  notes low back pain that has been worse the last couple months and neck pain that is helped by Botox Integumentary: No rash, pruritus, skin lesions Neurological:  as above Psychiatric: No depression at this time.  No anxiety Endocrine: No palpitations, diaphoresis, change in appetite, change in weigh or increased thirst Hematologic/Lymphatic:   She has anemia due to dysfunctional uterine bleeding. Allergic/Immunologic: No itchy/runny eyes, nasal congestion, recent allergic  reactions, rashes  ALLERGIES: Allergies  Allergen Reactions  . Cefaclor Swelling and Rash     neck swelling  . Prochlorperazine Maleate Rash and Other (See Comments)     jittery;tachycardia  . Bupropion Anxiety  . Sulfamethoxazole-Trimethoprim Nausea Only  . Tessalon Perles [Benzonatate] Rash    HOME MEDICATIONS: Outpatient Medications Prior to Visit  Medication Sig Dispense Refill  . atenolol (TENORMIN) 25 MG tablet Take 1 tablet (25 mg total) by mouth daily. 90 tablet 3  . Botulinum Toxin Type A (BOTOX) 200 units SOLR MD to inject IM every 3 mos. for migraine headaches 1 each 3  . empagliflozin (JARDIANCE) 25 MG TABS tablet Take 25 mg by mouth daily.    . indomethacin (INDOCIN) 25 MG capsule Take 1 capsule (25 mg total) by mouth 2 (two) times daily with a meal. 60 capsule 3  . meloxicam (MOBIC) 15 MG tablet TAKE 1 TABLET BY MOUTH EVERY DAY 30 tablet 1  . Multiple Vitamins-Minerals (MULTIVITAMIN WITH MINERALS) tablet Take by mouth.    . ondansetron (ZOFRAN-ODT) 8 MG disintegrating tablet Take 1 tablet (8 mg total) by mouth every 8 (eight) hours as needed for nausea or vomiting. 30 tablet 2  . oxyCODONE-acetaminophen (PERCOCET) 10-325 MG tablet Take 1 tablet by mouth every 8 (eight) hours as needed for pain. 1 pill every 4-6 hours as needed for breakthrough pain as needed 90 tablet 0  . PNEUMOCOCCAL VAC POLYVALENT IJ Inject as directed. Received in January 2021    . rizatriptan (MAXALT-MLT) 10 MG disintegrating tablet TAKE 1 TABLET BY MOUTH AS NEEDED FOR MIGRAINE. MAY REPEAT IN 2 HOURS IF NEEDED 12 tablet 11  . Semaglutide, 1 MG/DOSE, (OZEMPIC, 1 MG/DOSE,) 4 MG/3ML SOPN Inject 1 mg into the skin every 7 (seven) days.    Marland Kitchen tiZANidine (ZANAFLEX) 4 MG tablet TAKE 1 TABLET BY MOUTH THREE TIMES A DAY 90 tablet 0  . pantoprazole (PROTONIX) 40 MG tablet Take 40 mg by mouth 2 (two) times daily.      No facility-administered medications prior to visit.    PAST MEDICAL HISTORY: Past Medical  History:  Diagnosis Date  . Headache   . Hypertension   . Vision abnormalities     PAST SURGICAL HISTORY: Past Surgical History:  Procedure Laterality Date  . SINUS IRRIGATION      FAMILY HISTORY: Family History  Problem Relation Age of Onset  . Breast cancer Mother   . Melanoma Mother   . Atrial fibrillation Mother   . Supraventricular tachycardia Mother   . Heart attack Father   . Hypertension Father   . Hyperlipidemia Father     SOCIAL HISTORY:  Social History   Socioeconomic History  . Marital status: Single    Spouse name: Not on file  . Number of children: Not on file  . Years of education: Not on file  . Highest education level: Not on file  Occupational History  . Not on file  Tobacco Use  . Smoking status: Never Smoker  . Smokeless tobacco: Never Used  Substance and Sexual Activity  . Alcohol use: No    Alcohol/week: 0.0 standard drinks    Comment: Rare  . Drug use: No  . Sexual activity: Not on file  Other Topics Concern  . Not on file  Social History Narrative  . Not on file   Social Determinants of Health   Financial Resource Strain:   . Difficulty of Paying Living Expenses:   Food Insecurity:   . Worried About Charity fundraiser in the Last Year:   . Arboriculturist in the Last Year:   Transportation Needs:   . Film/video editor (Medical):   Marland Kitchen Lack of Transportation (Non-Medical):   Physical Activity:   . Days of Exercise per Week:   . Minutes of Exercise per Session:   Stress:   . Feeling of Stress :   Social Connections:   . Frequency of Communication with Friends and Family:   . Frequency of Social Gatherings with Friends and Family:   . Attends Religious Services:   . Active Member of Clubs or Organizations:   . Attends Archivist Meetings:   Marland Kitchen Marital Status:   Intimate Partner Violence:   . Fear of Current or Ex-Partner:   . Emotionally Abused:   Marland Kitchen Physically Abused:   . Sexually Abused:      PHYSICAL  EXAM  Vitals:   06/13/20 0947  BP: (!) 149/84  Pulse: 71  Weight: 258 lb (117 kg)  Height: 5\' 6"  (1.676 m)    Body mass index is 41.64 kg/m.   General: The patient is well-developed and well-nourished and in no acute distress   Musculoskeletal:   The neck has good range of motion.  She has tenderness predominantly over the right piriformis muscle..  Range of motion was normal.  Neurologic Exam  Mental status: The patient is alert and oriented x 3 at the time of the examination.  Speech is normal.  Cranial nerves: Extraocular movements are full.  Facial strength and sensation was normal.  Trapezius strength was normal.  No obvious hearing deficits are noted.  Motor:   Muscle tone and bulk is normal. She has normal strength.    Gait and station: Station and gait are normal.        ASSESSMENT AND PLAN    1. Chronic migraine   2. Abnormal brain MRI   3. Obstructive sleep apnea   4. Facet hypertrophy of lumbar region          1.   Inject 192.5 units Botox (7.5 units wasted) :   Frontalis (2.5 U x 4), nasalis/corrugators (2.5 U x 3), temporalis  (5 U x 9 - 5R, 4L), occipitalis (5 U x 6), splenius capitis (15 U x 2), trapezius (15 U x 2), C6C7 paraspinal muscles (10 U x 2), Right piriformis 20 U 2.    For breakthrough migraine, she will use indomethacin and either Maxalt or Percocet as needed. 3.   Continue to use CPAP nightly for OSA.  4.   If back pain worsens further referral back for another radiofrequency ablation 5.   return to see me in about 3 months for the next Botox injection, or sooner if she has new or worsening neurologic symptoms.    Micharl Helmes A. Felecia Shelling, MD, PhD 05/11/5092, 2:67 PM Certified in Neurology, Clinical Neurophysiology, Sleep Medicine, Pain Medicine and Neuroimaging  [facility] [address] [place], [place] [address] [phone]

## 2020-06-25 ENCOUNTER — Other Ambulatory Visit: Payer: Self-pay | Admitting: Neurology

## 2020-07-01 ENCOUNTER — Other Ambulatory Visit: Payer: Self-pay | Admitting: Neurology

## 2020-07-01 DIAGNOSIS — M47816 Spondylosis without myelopathy or radiculopathy, lumbar region: Secondary | ICD-10-CM

## 2020-07-02 ENCOUNTER — Other Ambulatory Visit: Payer: Self-pay | Admitting: Neurology

## 2020-07-02 DIAGNOSIS — M47816 Spondylosis without myelopathy or radiculopathy, lumbar region: Secondary | ICD-10-CM

## 2020-07-04 ENCOUNTER — Other Ambulatory Visit: Payer: Self-pay | Admitting: *Deleted

## 2020-07-04 MED ORDER — OXYCODONE-ACETAMINOPHEN 10-325 MG PO TABS: 1.0000 | ORAL_TABLET | Freq: Three times a day (TID) | ORAL | 0 refills | Status: DC | PRN

## 2020-07-04 NOTE — Telephone Encounter (Signed)
Checked drug registry. She last refilled oxycodone 12/07/19 #90. Last seen 03/21/20 and next f/u 09/19/20.  Also received fill of Hydrocodone-Acetamin 5-325 Mg on 01/08/20 #30 from Dia Sitter (family physician)

## 2020-07-08 NOTE — Telephone Encounter (Signed)
Submitted urgent PA request on CMM. Key: [id number]. Waiting on determination from [place].

## 2020-07-08 NOTE — Telephone Encounter (Signed)
Received fax from [facility] that [place] approved 07/08/20-01/08/21. [place] Non-grandfathered [id number]

## 2020-07-15 ENCOUNTER — Ambulatory Visit
Admission: RE | Admit: 2020-07-15 | Discharge: 2020-07-15 | Disposition: A | Discharge status: Home / Self Care | Payer: BC Managed Care – PPO | Source: Ambulatory Visit | Attending: Neurology | Admitting: Neurology

## 2020-07-15 ENCOUNTER — Other Ambulatory Visit: Payer: Self-pay | Admitting: Neurology

## 2020-07-15 ENCOUNTER — Other Ambulatory Visit: Payer: Self-pay

## 2020-07-15 DIAGNOSIS — M47816 Spondylosis without myelopathy or radiculopathy, lumbar region: Secondary | ICD-10-CM

## 2020-07-16 ENCOUNTER — Other Ambulatory Visit: Payer: Self-pay | Admitting: Neurology

## 2020-07-26 ENCOUNTER — Ambulatory Visit
Admission: RE | Admit: 2020-07-26 | Discharge: 2020-07-26 | Disposition: A | Discharge status: Home / Self Care | Payer: BC Managed Care – PPO | Source: Ambulatory Visit | Attending: Neurology | Admitting: Neurology

## 2020-07-26 ENCOUNTER — Other Ambulatory Visit: Payer: Self-pay

## 2020-07-26 ENCOUNTER — Other Ambulatory Visit: Payer: Self-pay | Admitting: Neurology

## 2020-07-26 DIAGNOSIS — M47816 Spondylosis without myelopathy or radiculopathy, lumbar region: Secondary | ICD-10-CM

## 2020-07-26 MED ORDER — KETOROLAC TROMETHAMINE 30 MG/ML IJ SOLN
30.0000 mg | Freq: Once | INTRAMUSCULAR | Status: AC
  Administered 2020-07-26: 30 mg via INTRAVENOUS

## 2020-07-26 MED ORDER — MIDAZOLAM HCL 2 MG/2ML IJ SOLN
1.0000 mg | INTRAMUSCULAR | Status: DC | PRN
  Administered 2020-07-26: 1 mg via INTRAVENOUS
  Administered 2020-07-26: 0.5 mg via INTRAVENOUS
  Administered 2020-07-26: 1 mg via INTRAVENOUS
  Administered 2020-07-26: 0.5 mg via INTRAVENOUS

## 2020-07-26 MED ORDER — SODIUM CHLORIDE 0.9 % IV SOLN: Freq: Once | INTRAVENOUS | Status: AC

## 2020-07-26 MED ORDER — FENTANYL CITRATE (PF) 100 MCG/2ML IJ SOLN
25.0000 ug | INTRAMUSCULAR | Status: DC | PRN
  Administered 2020-07-26: 50 ug via INTRAVENOUS
  Administered 2020-07-26: 25 ug via INTRAVENOUS

## 2020-07-26 MED ORDER — METHYLPREDNISOLONE ACETATE 40 MG/ML INJ SUSP (RADIOLOG
120.0000 mg | Freq: Once | INTRAMUSCULAR | Status: AC
  Administered 2020-07-26: 120 mg via INTRALESIONAL

## 2020-07-26 NOTE — Discharge Instructions (Signed)
Radio Frequency Ablation Post Procedure Discharge Instructions ° °1. May resume a regular diet and any medications that you routinely take (including pain medications). °2. No driving day of procedure. °3. Upon discharge go home and rest for at least 4 hours.  May use an ice pack as needed to injection sites on back. °4. Remove bandades later, today. ° ° ° °Please contact our office at [phone] for the following symptoms: ° °· Fever greater than 100 degrees °· Increased swelling, pain, or redness at injection site. ° ° °Thank you for visiting [facility]. °

## 2020-07-31 ENCOUNTER — Other Ambulatory Visit: Payer: Self-pay | Admitting: Neurology

## 2020-08-01 ENCOUNTER — Other Ambulatory Visit: Payer: Self-pay | Admitting: Neurology

## 2020-08-01 ENCOUNTER — Telehealth: Payer: Self-pay | Admitting: Neurology

## 2020-08-01 DIAGNOSIS — M47816 Spondylosis without myelopathy or radiculopathy, lumbar region: Secondary | ICD-10-CM

## 2020-08-01 NOTE — Telephone Encounter (Signed)
Called and spoke with pt to further discuss. She states insurance required medial branch block prior to RFA. She has had 3 medial branch blocks. Just had RFA on left side this past Friday at [facility] with Dr. Nelia Shi. Right side is hurting her again. Wondering if it is common for people to have sulfa burps/nausea/vomiting after receiving fentanyl during procedure. She has also left a message for Dr. Fabiola Backer [facility]), waiting on a response. Dr. Nelia Shi told her she would know in 3-5 days if she needed to other side done after doing left side 07/26/20. She is wanting to know if Dr. Felecia Shelling thinks it is ok to try and do right side. Aware order was previously placed but wondering if new order needs to be put in. She has her wedding coming up in October and trying to get soe relief from her pain prior to that. Advised I will send Dr. Felecia Shelling a message and will contact her back once I receive a response.

## 2020-08-01 NOTE — Telephone Encounter (Signed)
Pt called, having pain on right side. Would like to know if need RSA order at [facility]. Would like a call from the nurse.

## 2020-08-01 NOTE — Telephone Encounter (Signed)
The facet hypertrophy is bilateral so it is probably the source of pain on the right.   We could refer back for the right side  If the N/as within 24 hours of the procedure its possible the fentanyl played a role, less likely if > 24 hours later

## 2020-08-01 NOTE — Telephone Encounter (Signed)
Dr. Felecia Shelling sent pt mychart message.

## 2020-08-13 ENCOUNTER — Other Ambulatory Visit: Payer: BC Managed Care – PPO

## 2020-08-13 ENCOUNTER — Other Ambulatory Visit: Payer: Self-pay | Admitting: Neurology

## 2020-08-13 ENCOUNTER — Other Ambulatory Visit: Payer: Self-pay

## 2020-08-13 ENCOUNTER — Ambulatory Visit
Admission: RE | Admit: 2020-08-13 | Discharge: 2020-08-13 | Disposition: A | Discharge status: Home / Self Care | Payer: BC Managed Care – PPO | Source: Ambulatory Visit | Attending: Neurology | Admitting: Neurology

## 2020-08-13 DIAGNOSIS — M47816 Spondylosis without myelopathy or radiculopathy, lumbar region: Secondary | ICD-10-CM

## 2020-08-13 NOTE — Discharge Instructions (Signed)

## 2020-08-15 ENCOUNTER — Inpatient Hospital Stay: Admission: RE | Admit: 2020-08-15 | Payer: BC Managed Care – PPO | Source: Ambulatory Visit

## 2020-08-26 ENCOUNTER — Telehealth: Payer: Self-pay | Admitting: Neurology

## 2020-08-26 NOTE — Telephone Encounter (Signed)
Patient has a Botox appointment on 10/28. Her [employer] PA expires on 10/16. I filled out new [employer] PA form and gave to MD to sign.

## 2020-08-27 ENCOUNTER — Other Ambulatory Visit: Payer: BC Managed Care – PPO

## 2020-08-27 NOTE — Telephone Encounter (Signed)
I called the patient and was able to speak with her. Advised her to called [facility] to give consent for Botox shipment.

## 2020-08-27 NOTE — Telephone Encounter (Signed)
I faxed signed continuation request to [facility].

## 2020-08-27 NOTE — Telephone Encounter (Signed)
I called [facility] to see if Botox delivery could be scheduled. I spoke with Idette who states that patient needs to give consent for shipment. She states that patient has been called and LVM.

## 2020-08-31 ENCOUNTER — Other Ambulatory Visit: Payer: Self-pay | Admitting: Neurology

## 2020-09-05 NOTE — Telephone Encounter (Signed)
Received Botox PA approval via fax. PA #[id number] (09/08/20-09/07/21).  I called [facility] and spoke with Shanda to give her the PA information. She states medication is ready to be scheduled. Botox TBD 10/20.

## 2020-09-11 NOTE — Telephone Encounter (Signed)
(  1) 200U vial delivered today from [facility] for patient's 10/28 appointment.

## 2020-09-19 ENCOUNTER — Ambulatory Visit (INDEPENDENT_AMBULATORY_CARE_PROVIDER_SITE_OTHER): Payer: BC Managed Care – PPO | Admitting: Neurology

## 2020-09-19 VITALS — BP 148/90 | HR 77 | Ht 66.0 in | Wt 255.0 lb

## 2020-09-19 DIAGNOSIS — R9089 Other abnormal findings on diagnostic imaging of central nervous system: Secondary | ICD-10-CM

## 2020-09-19 DIAGNOSIS — G43709 Chronic migraine without aura, not intractable, without status migrainosus: Secondary | ICD-10-CM

## 2020-09-19 DIAGNOSIS — G4733 Obstructive sleep apnea (adult) (pediatric): Secondary | ICD-10-CM

## 2020-09-19 DIAGNOSIS — M47816 Spondylosis without myelopathy or radiculopathy, lumbar region: Secondary | ICD-10-CM

## 2020-09-19 NOTE — Progress Notes (Signed)
[facility]  PATIENT: Pamela Murphy DOB: 1971/05/23 REASON FOR VISIT: Chronic Migraine headaches   HISTORICAL  CHIEF COMPLAINT:  Chief Complaint  Patient presents with  . Botulinum Toxin Injection    RM 13, alone. Botox for migraines. 200Uvialx2 (spec. pharm). Lot: [id number]. Exp: 06/24, [place]: [id number]  . Procedure    Had emergency surgery on 08/26/20. Had to get her gallbladder removed. She got married 09/06/20. Wants to discuss need for ablation. Also wondering if MRI should be done before the end of the year.    HISTORY OF PRESENT ILLNESS:  Pamela Murphy is a 49 y.o. woman with a chronic migraine disorder, obstructive sleep apnea and abnormal MRI of the brain.   Update 09/19/2020:   In general, her migraines have continued to do well on Botox therapy.  At the last series of injections, I reduce the amount of Botox in the forehead and she did not note any ptosis.  At the previous injection she had mild ptosis.  When the migraine occurs, she takes indomethacin or a triptan.     Occasionally she needs to take a Percocet if the response is not complete.  Neck pain is also improved with Botox.  When she gets her more typical migraine headaches she has nausea, photophobia and phonophobia.  She had a cholecystectomy a couple weeks ago.  Pain was in the right flank, at different location than her back pain.  Additionally, she has OSA and uses CPAP.    Download last year showed 100% compliance with excellent efficacy (AHI of 0.8).  She sleeps better when she uses CPAP than when she does not.  She feels refreshed in the morning.  Back pain improved with radiofrequency ablation but she still has some back pain at times.  She had radiofrequency ablation by Dr. Jola Baptist October 2019.  MRI of the lumbar spine has shown left greater than right facet hypertrophy at L4-L5 and L5-S1.   Migraine history:  She has a long history of chronic migraines that were occurring almost every  day (25-28 days/month) for 4 or more hours a day. Multiple medications were tried for prophylactic and abortive therapies with suboptimal response.    In the past, she has failed multiple medications including:  Antiepileptics (Topamax, Keppra, zonisamide), anti-depressants (Cymbalta, Prozac), multiple NSAID's, betas blockers (Inderal, atenolol), muscle relaxants (soma, tizanidine, cyclobenzaprine) in various combinations.   Once the headache occurs, she has tried triptan's with incomplete benefit much of the time. She has also tried opiates.   IM Toradol has been most effective when one occurs.  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   I "undiagnosed her" around 2010 and she got a second opinion from Dr. Terie Purser who also concurred.  MRI shows small round foci predominantly in the subcortical and deep white matter in a nonspecific manner.  Her last MRI of the brain was in 2016 and was stable compared to the previous ones.  She had had paresthesias and fatigue in the past that contributed to her diagnosis.   We have discussed that she has been stable times many years off therapy that MS is significantly less likely.   REVIEW OF SYSTEMS:  Constitutional: No fevers, chills, sweats, or change in appetite Eyes: No visual changes, double vision, eye pain Ear, nose and throat: No hearing loss, ear pain, nasal congestion, sore throat Cardiovascular: No chest pain, palpitations Respiratory:  see above.  She has  OSA. GastrointestinaI: No nausea, vomiting, diarrhea, abdominal pain, fecal incontinence Genitourinary:  No dysuria, urinary retention or frequency.  No nocturia.   Dysfunctional uterine bleeding Musculoskeletal:  notes low back pain that has been worse the last couple months and neck pain that is helped by Botox Integumentary: No rash, pruritus, skin lesions Neurological: as above Psychiatric: No depression at this time.  No  anxiety Endocrine: No palpitations, diaphoresis, change in appetite, change in weigh or increased thirst Hematologic/Lymphatic:   She has anemia due to dysfunctional uterine bleeding. Allergic/Immunologic: No itchy/runny eyes, nasal congestion, recent allergic reactions, rashes  ALLERGIES: Allergies  Allergen Reactions  . Cefaclor Swelling and Rash     neck swelling  . Prochlorperazine Maleate Rash and Other (See Comments)     jittery;tachycardia  . Bupropion Anxiety  . Sulfamethoxazole-Trimethoprim Nausea Only  . Tessalon Perles [Benzonatate] Rash    HOME MEDICATIONS: Outpatient Medications Prior to Visit  Medication Sig Dispense Refill  . atenolol (TENORMIN) 25 MG tablet Take 1 tablet (25 mg total) by mouth daily. 90 tablet 3  . Botulinum Toxin Type A (BOTOX) 200 units SOLR MD to inject IM every 3 mos. for migraine headaches 1 each 3  . empagliflozin (JARDIANCE) 25 MG TABS tablet Take 25 mg by mouth daily.    . indomethacin (INDOCIN) 25 MG capsule Take 1 capsule (25 mg total) by mouth 2 (two) times daily with a meal. 60 capsule 3  . meloxicam (MOBIC) 15 MG tablet TAKE 1 TABLET BY MOUTH EVERY DAY 30 tablet 1  . Multiple Vitamins-Minerals (MULTIVITAMIN WITH MINERALS) tablet Take by mouth.    . ondansetron (ZOFRAN-ODT) 8 MG disintegrating tablet Take 1 tablet (8 mg total) by mouth every 8 (eight) hours as needed for nausea or vomiting. 30 tablet 2  . oxyCODONE-acetaminophen (PERCOCET) 10-325 MG tablet Take 1 tablet by mouth every 8 (eight) hours as needed for pain. 1 pill every 4-6 hours as needed for breakthrough pain as needed 90 tablet 0  . PNEUMOCOCCAL VAC POLYVALENT IJ Inject as directed. Received in January 2021    . rizatriptan (MAXALT-MLT) 10 MG disintegrating tablet TAKE 1 TABLET BY MOUTH AS NEEDED FOR MIGRAINE. MAY REPEAT IN 2 HOURS IF NEEDED 12 tablet 11  . Semaglutide, 1 MG/DOSE, (OZEMPIC, 1 MG/DOSE,) 4 MG/3ML SOPN Inject 1 mg into the skin every 7 (seven) days.    Marland Kitchen  tiZANidine (ZANAFLEX) 4 MG tablet TAKE 1 TABLET BY MOUTH THREE TIMES A DAY 90 tablet 0  . pantoprazole (PROTONIX) 40 MG tablet Take 40 mg by mouth 2 (two) times daily.      No facility-administered medications prior to visit.    PAST MEDICAL HISTORY: Past Medical History:  Diagnosis Date  . Headache   . Hypertension   . Type 2 diabetes mellitus ([place]) 06/08/2018  . Vision abnormalities     PAST SURGICAL HISTORY: Past Surgical History:  Procedure Laterality Date  . SINUS IRRIGATION      FAMILY HISTORY: Family History  Problem Relation Age of Onset  . Breast cancer Mother   . Melanoma Mother   . Atrial fibrillation Mother   . Supraventricular tachycardia Mother   . Heart attack Father   . Hypertension Father   . Hyperlipidemia Father     SOCIAL HISTORY:  Social History   Socioeconomic History  . Marital status: Single    Spouse name: Not on file  . Number of children: Not on file  . Years of education: Not on file  . Highest  education level: Not on file  Occupational History  . Not on file  Tobacco Use  . Smoking status: Never Smoker  . Smokeless tobacco: Never Used  Substance and Sexual Activity  . Alcohol use: No    Alcohol/week: 0.0 standard drinks    Comment: Rare  . Drug use: No  . Sexual activity: Not on file  Other Topics Concern  . Not on file  Social History Narrative  . Not on file   Social Determinants of Health   Financial Resource Strain:   . Difficulty of Paying Living Expenses: Not on file  Food Insecurity:   . Worried About Charity fundraiser in the Last Year: Not on file  . Ran Out of Food in the Last Year: Not on file  Transportation Needs:   . Lack of Transportation (Medical): Not on file  . Lack of Transportation (Non-Medical): Not on file  Physical Activity:   . Days of Exercise per Week: Not on file  . Minutes of Exercise per Session: Not on file  Stress:   . Feeling of Stress : Not on file  Social Connections:   .  Frequency of Communication with Friends and Family: Not on file  . Frequency of Social Gatherings with Friends and Family: Not on file  . Attends Religious Services: Not on file  . Active Member of Clubs or Organizations: Not on file  . Attends Archivist Meetings: Not on file  . Marital Status: Not on file  Intimate Partner Violence:   . Fear of Current or Ex-Partner: Not on file  . Emotionally Abused: Not on file  . Physically Abused: Not on file  . Sexually Abused: Not on file     PHYSICAL EXAM  Vitals:   09/19/20 1024  BP: (!) 148/90  Pulse: 77  Weight: 255 lb (115.7 kg)  Height: 5\' 6"  (1.676 m)    Body mass index is 41.16 kg/m.   General: The patient is well-developed and well-nourished and in no acute distress   Musculoskeletal:   The neck has good range of motion.  She has tenderness predominantly over the right piriformis muscle..  Range of motion was normal.  Neurologic Exam  Mental status: The patient is alert and oriented x 3 at the time of the examination.  Speech is normal.  Cranial nerves: Extraocular movements are full.  Facial strength and sensation was normal.  Trapezius strength was normal.  No obvious hearing deficits are noted.  Motor:   Muscle tone and bulk is normal. She has normal strength.    Gait and station: Station and gait are normal.        ASSESSMENT AND PLAN    1. Chronic migraine w/o aura, not intractable, w/o stat migr   2. Facet hypertrophy of lumbar region   3. Abnormal brain MRI   4. Obstructive sleep apnea          1.   Inject 172 units Botox (7.5 units wasted) :   Frontalis (2.5 U x 4), nasalis/corrugators (2.5 U x 3), temporalis  (5 U x 9 - 5R, 4L), occipitalis (5 U x 6), splenius capitis (15 U x 2), trapezius (15 U x 2), C6C7 paraspinal muscles (10 U x 2), wasted 28 U 2.    Use indomethacin,  Maxalt or Percocet as needed for breakthrough. 3.   Continue to use CPAP nightly for OSA.  4.   If back pain worsens  further referral back for another radiofrequency  ablation.  We discussed if worsens and RFA stops working, she may need surgery.  5.   return to see me in about 3 months for the next Botox injection, or sooner if she has new or worsening neurologic symptoms.    Cherrill Scrima A. Felecia Shelling, MD, PhD 67/67/2094, 70:96 AM Certified in Neurology, Clinical Neurophysiology, Sleep Medicine, Pain Medicine and Neuroimaging  [facility] [address], [place], [place] [address] [phone]

## 2020-10-08 ENCOUNTER — Other Ambulatory Visit: Payer: Self-pay | Admitting: Neurology

## 2020-10-15 ENCOUNTER — Other Ambulatory Visit: Payer: Self-pay | Admitting: Neurology

## 2020-10-15 ENCOUNTER — Other Ambulatory Visit: Payer: BC Managed Care – PPO

## 2020-10-15 ENCOUNTER — Other Ambulatory Visit: Payer: Self-pay

## 2020-10-15 ENCOUNTER — Ambulatory Visit
Admission: RE | Admit: 2020-10-15 | Discharge: 2020-10-15 | Disposition: A | Discharge status: Home / Self Care | Payer: BC Managed Care – PPO | Source: Ambulatory Visit | Attending: Neurology | Admitting: Neurology

## 2020-10-15 DIAGNOSIS — M47816 Spondylosis without myelopathy or radiculopathy, lumbar region: Secondary | ICD-10-CM

## 2020-10-15 MED ORDER — METHYLPREDNISOLONE ACETATE 40 MG/ML INJ SUSP (RADIOLOG
120.0000 mg | Freq: Once | INTRAMUSCULAR | Status: AC
  Administered 2020-10-15: 120 mg via INTRALESIONAL

## 2020-10-15 MED ORDER — MIDAZOLAM HCL 2 MG/2ML IJ SOLN
1.0000 mg | INTRAMUSCULAR | Status: DC | PRN
  Administered 2020-10-15 (×2): 1 mg via INTRAVENOUS

## 2020-10-15 MED ORDER — FENTANYL CITRATE (PF) 100 MCG/2ML IJ SOLN
25.0000 ug | INTRAMUSCULAR | Status: DC | PRN
  Administered 2020-10-15 (×2): 50 ug via INTRAVENOUS

## 2020-10-15 MED ORDER — ONDANSETRON HCL 4 MG/2ML IJ SOLN
4.0000 mg | Freq: Once | INTRAMUSCULAR | Status: AC
  Administered 2020-10-15: 4 mg via INTRAVENOUS

## 2020-10-15 MED ORDER — KETOROLAC TROMETHAMINE 30 MG/ML IJ SOLN
30.0000 mg | Freq: Once | INTRAMUSCULAR | Status: AC
  Administered 2020-10-15: 30 mg via INTRAVENOUS

## 2020-10-15 MED ORDER — SODIUM CHLORIDE 0.9 % IV SOLN: Freq: Once | INTRAVENOUS | Status: AC

## 2020-10-15 NOTE — Progress Notes (Signed)
Pt back in nurses station. Talking in complete sentences, alert and oriented. Pt has no complaints at this time.

## 2020-10-15 NOTE — Discharge Instructions (Signed)
Radio Frequency Ablation Post Procedure Discharge Instructions ° °1. May resume a regular diet and any medications that you routinely take (including pain medications). °2. No driving day of procedure. °3. Upon discharge go home and rest for at least 4 hours.  May use an ice pack as needed to injection sites on back. °4. Remove bandades later, today. ° ° ° °Please contact our office at [phone] for the following symptoms: ° °· Fever greater than 100 degrees °· Increased swelling, pain, or redness at injection site. ° ° °Thank you for visiting [facility]. °

## 2020-11-02 ENCOUNTER — Other Ambulatory Visit: Payer: Self-pay | Admitting: Neurology

## 2020-11-07 ENCOUNTER — Other Ambulatory Visit: Payer: Self-pay | Admitting: *Deleted

## 2020-11-07 ENCOUNTER — Other Ambulatory Visit: Payer: Self-pay | Admitting: Neurology

## 2020-11-07 MED ORDER — OXYCODONE-ACETAMINOPHEN 10-325 MG PO TABS: 1.0000 | ORAL_TABLET | Freq: Three times a day (TID) | ORAL | 0 refills | Status: DC | PRN

## 2020-11-07 NOTE — Telephone Encounter (Signed)
Reviewed pt chart. She was last seen 09/19/20 and next f/u 12/24/20. Checked drug registry. She received oxycodone 5-325mg  on 08/26/20 #10 from Sherre Scarlet, DO (surgeon). Last received oxycodone 10-325mg  on 07/09/20 #90 from Dr. Felecia Shelling.

## 2020-11-28 ENCOUNTER — Other Ambulatory Visit: Payer: Self-pay | Admitting: Neurology

## 2020-12-02 ENCOUNTER — Telehealth: Payer: Self-pay | Admitting: Neurology

## 2020-12-02 NOTE — Telephone Encounter (Signed)
Patient has a Botox appointment on 2/1. I received a fax from [place] stating that Botox TBD 1/19.

## 2020-12-11 NOTE — Telephone Encounter (Signed)
(  1) 200U vial of Botox delivered today from [facility]. 

## 2020-12-12 ENCOUNTER — Other Ambulatory Visit: Payer: Self-pay | Admitting: Neurology

## 2020-12-24 ENCOUNTER — Encounter: Payer: Self-pay | Admitting: Neurology

## 2020-12-24 ENCOUNTER — Ambulatory Visit: Payer: BC Managed Care – PPO | Admitting: Neurology

## 2020-12-24 VITALS — BP 143/88 | HR 84 | Ht 66.0 in | Wt 250.0 lb

## 2020-12-24 DIAGNOSIS — R9089 Other abnormal findings on diagnostic imaging of central nervous system: Secondary | ICD-10-CM

## 2020-12-24 DIAGNOSIS — G43709 Chronic migraine without aura, not intractable, without status migrainosus: Secondary | ICD-10-CM | POA: Diagnosis not present

## 2020-12-24 DIAGNOSIS — G4733 Obstructive sleep apnea (adult) (pediatric): Secondary | ICD-10-CM

## 2020-12-24 DIAGNOSIS — M47816 Spondylosis without myelopathy or radiculopathy, lumbar region: Secondary | ICD-10-CM

## 2020-12-24 DIAGNOSIS — M542 Cervicalgia: Secondary | ICD-10-CM

## 2020-12-24 NOTE — Progress Notes (Signed)
[facility]  PATIENT: Pamela Murphy DOB: 03/05/1971 REASON FOR VISIT: Chronic Migraine headaches   HISTORICAL  CHIEF COMPLAINT:  Chief Complaint  Patient presents with  . Procedure    Botox' Room 12    HISTORY OF PRESENT ILLNESS:  Pamela Murphy is a 50 y.o. woman with a chronic migraine disorder, obstructive sleep apnea and abnormal MRI of the brain.   Update 12/24/2020 From migraine headaches have done very well since her last Botox therapy about 3 months ago.  She did not note any eyelid drooping.  She has had only a few migraines.  She notes the new job is less stressful than the old job.  When the migraine occurs, she takes indomethacin or a triptan.     Occasionally she needs to take a Percocet if the response is not complete.  Neck pain is also improved with Botox.  When she gets her more typical migraine headaches she has nausea, photophobia and phonophobia.  Additionally, she has OSA and uses CPAP.    Download last year showed 100% compliance with excellent efficacy (AHI of 0.8).  She sleeps better when she uses CPAP than when she does not.  She feels refreshed in the morning.  She had a repeat radiofrequency ablation a couple months ago and feels that her lower back pain is much better back pain improved with radiofrequency ablation but she still has some back pain at times.  She had radiofrequency ablation by Dr. Jola Baptist October 201.  MRI of the lumbar spine has shown left greater than right facet hypertrophy at L4-L5 and L5-S1.   Migraine history:  She has a long history of chronic migraines that were occurring almost every day (25-28 days/month) for 4 or more hours a day. Multiple medications were tried for prophylactic and abortive therapies with suboptimal response.    In the past, she has failed multiple medications including:  Antiepileptics (Topamax, Keppra, zonisamide), anti-depressants (Cymbalta, Prozac), multiple NSAID's, betas blockers (Inderal,  atenolol), muscle relaxants (soma, tizanidine, cyclobenzaprine) in various combinations.   Once the headache occurs, she has tried triptan's with incomplete benefit much of the time. She has also tried opiates.   IM Toradol has been most effective when one occurs.  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   I "undiagnosed her" around 2010 and she got a second opinion from Dr. Terie Purser who also concurred.  MRI shows small round foci predominantly in the subcortical and deep white matter in a nonspecific manner.  Her last MRI of the brain was in 2016 and was stable compared to the previous ones.  She had had paresthesias and fatigue in the past that contributed to her diagnosis.   We have discussed that she has been stable times many years off therapy that MS is significantly less likely.   REVIEW OF SYSTEMS:  Constitutional: No fevers, chills, sweats, or change in appetite Eyes: No visual changes, double vision, eye pain Ear, nose and throat: No hearing loss, ear pain, nasal congestion, sore throat Cardiovascular: No chest pain, palpitations Respiratory:  see above.  She has OSA. GastrointestinaI: No nausea, vomiting, diarrhea, abdominal pain, fecal incontinence Genitourinary:  No dysuria, urinary retention or frequency.  No nocturia.   Dysfunctional uterine bleeding Musculoskeletal:  notes low back pain that has been worse the last couple months and neck pain that is helped by Botox Integumentary: No rash, pruritus, skin lesions Neurological: as above Psychiatric:  No depression at this time.  No anxiety Endocrine: No palpitations, diaphoresis, change in appetite, change in weigh or increased thirst Hematologic/Lymphatic:   She has anemia due to dysfunctional uterine bleeding. Allergic/Immunologic: No itchy/runny eyes, nasal congestion, recent allergic reactions, rashes  ALLERGIES: Allergies  Allergen Reactions  . Cefaclor Swelling  and Rash     neck swelling  . Prochlorperazine Maleate Rash and Other (See Comments)     jittery;tachycardia  . Bupropion Anxiety  . Sulfamethoxazole-Trimethoprim Nausea Only  . Tessalon Perles [Benzonatate] Rash    HOME MEDICATIONS: Outpatient Medications Prior to Visit  Medication Sig Dispense Refill  . atenolol (TENORMIN) 25 MG tablet Take 1 tablet (25 mg total) by mouth daily. 90 tablet 3  . Botulinum Toxin Type A (BOTOX) 200 units SOLR MD to inject IM every 3 mos. for migraine headaches 1 each 3  . empagliflozin (JARDIANCE) 25 MG TABS tablet Take 25 mg by mouth daily.    . indomethacin (INDOCIN) 25 MG capsule TAKE 1 CAPSULE (25 MG TOTAL) BY MOUTH 2 (TWO) TIMES DAILY WITH A MEAL. 60 capsule 3  . meloxicam (MOBIC) 15 MG tablet TAKE 1 TABLET BY MOUTH EVERY DAY 30 tablet 1  . Multiple Vitamins-Minerals (MULTIVITAMIN WITH MINERALS) tablet Take by mouth.    . ondansetron (ZOFRAN-ODT) 8 MG disintegrating tablet Take 1 tablet (8 mg total) by mouth every 8 (eight) hours as needed for nausea or vomiting. 30 tablet 2  . oxyCODONE-acetaminophen (PERCOCET) 10-325 MG tablet Take 1 tablet by mouth every 8 (eight) hours as needed for pain. 1 pill every 4-6 hours as needed for breakthrough pain as needed 90 tablet 0  . PNEUMOCOCCAL VAC POLYVALENT IJ Inject as directed. Received in January 2021    . rizatriptan (MAXALT-MLT) 10 MG disintegrating tablet TAKE 1 TABLET BY MOUTH AS NEEDED FOR MIGRAINE. MAY REPEAT IN 2 HOURS IF NEEDED 12 tablet 11  . Semaglutide, 1 MG/DOSE, (OZEMPIC, 1 MG/DOSE,) 4 MG/3ML SOPN Inject 1 mg into the skin every 7 (seven) days.    Marland Kitchen tiZANidine (ZANAFLEX) 4 MG tablet TAKE 1 TABLET BY MOUTH THREE TIMES A DAY 90 tablet 0  . pantoprazole (PROTONIX) 40 MG tablet Take 40 mg by mouth 2 (two) times daily.      No facility-administered medications prior to visit.    PAST MEDICAL HISTORY: Past Medical History:  Diagnosis Date  . Headache   . Hypertension   . Type 2 diabetes mellitus  ([place]) 06/08/2018  . Vision abnormalities     PAST SURGICAL HISTORY: Past Surgical History:  Procedure Laterality Date  . SINUS IRRIGATION      FAMILY HISTORY: Family History  Problem Relation Age of Onset  . Breast cancer Mother   . Melanoma Mother   . Atrial fibrillation Mother   . Supraventricular tachycardia Mother   . Heart attack Father   . Hypertension Father   . Hyperlipidemia Father     SOCIAL HISTORY:  Social History   Socioeconomic History  . Marital status: Single    Spouse name: Not on file  . Number of children: Not on file  . Years of education: Not on file  . Highest education level: Not on file  Occupational History    Comment: full time  Tobacco Use  . Smoking status: Never Smoker  . Smokeless tobacco: Never Used  Substance and Sexual Activity  . Alcohol use: No    Alcohol/week: 0.0 standard drinks    Comment: Rare  . Drug use: No  .  Sexual activity: Not on file  Other Topics Concern  . Not on file  Social History Narrative   Lives with spouse   Right Handed   Drinks 1-2 cups caffeine daily   Social Determinants of Health   Financial Resource Strain: Not on file  Food Insecurity: Not on file  Transportation Needs: Not on file  Physical Activity: Not on file  Stress: Not on file  Social Connections: Not on file  Intimate Partner Violence: Not on file     PHYSICAL EXAM  Vitals:   12/24/20 1316  BP: (!) 143/88  Pulse: 84  Weight: 250 lb (113.4 kg)  Height: 5\' 6"  (1.676 m)    Body mass index is 40.35 kg/m.   General: The patient is well-developed and well-nourished and in no acute distress   Musculoskeletal:   The neck has good range of motion.  She has tenderness predominantly over the right piriformis muscle..  Range of motion was normal.  Neurologic Exam  Mental status: The patient is alert and oriented x 3 at the time of the examination.  Speech is normal.  Cranial nerves: Extraocular movements are full.  Facial  strength and sensation was normal.  Trapezius strength was normal.  No obvious hearing deficits are noted.  Motor:   Muscle tone and bulk is normal. She has normal strength.    Gait and station: Station and gait are normal.        ASSESSMENT AND PLAN    1. Chronic migraine w/o aura, not intractable, w/o stat migr   2. Neck pain   3. Abnormal brain MRI   4. Facet hypertrophy of lumbar region   5. Obstructive sleep apnea          1.   Inject 172 units Botox (7.5 units wasted) :   Frontalis (2.5 U x 4), nasalis/corrugators (2.5 U x 3), temporalis  (5 U x 9 - 5R, 4L), occipitalis (5 U x 6), splenius capitis (15 U x 2), trapezius (15 U x 2), C6C7 paraspinal muscles (10 U x 2), wasted 28 U 2.    If the migraine occurs she can treat this with indomethacin,  Maxalt or Percocet as needed. 3.   Continue to use CPAP nightly for OSA.  4.   She will let know if her back pain worsens again.   5.   Return to see me in about 3 months for the next Botox injection, or sooner if she has new or worsening neurologic symptoms.    Dynisha Due A. [place], MD, PhD 12/24/2020, 9:17 PM Certified in Neurology, Clinical Neurophysiology, Sleep Medicine, Pain Medicine and Neuroimaging  [facility] [address], [address] [place], [place] [phone]

## 2021-01-07 ENCOUNTER — Other Ambulatory Visit: Payer: Self-pay | Admitting: Neurology

## 2021-01-07 DIAGNOSIS — M5430 Sciatica, unspecified side: Secondary | ICD-10-CM

## 2021-01-07 DIAGNOSIS — M47816 Spondylosis without myelopathy or radiculopathy, lumbar region: Secondary | ICD-10-CM

## 2021-01-07 MED ORDER — METHYLPREDNISOLONE 4 MG PO TABS: ORAL_TABLET | ORAL | 0 refills | Status: DC

## 2021-01-07 NOTE — Progress Notes (Signed)
medrol 

## 2021-01-08 ENCOUNTER — Telehealth: Payer: Self-pay | Admitting: Neurology

## 2021-01-08 NOTE — Telephone Encounter (Signed)
no to the covid questions MR Lumbar spine wo contrast Dr. Cheree Ditto Josem Kaufmann: [id number] (exp. 01/07/21 to 07/05/21). Patient is scheduled at [facility] for 01/21/21.

## 2021-01-21 ENCOUNTER — Ambulatory Visit: Payer: BC Managed Care – PPO

## 2021-01-21 DIAGNOSIS — M5430 Sciatica, unspecified side: Secondary | ICD-10-CM | POA: Diagnosis not present

## 2021-01-21 DIAGNOSIS — M47816 Spondylosis without myelopathy or radiculopathy, lumbar region: Secondary | ICD-10-CM

## 2021-01-23 ENCOUNTER — Other Ambulatory Visit: Payer: Self-pay | Admitting: *Deleted

## 2021-01-23 MED ORDER — GABAPENTIN 300 MG PO CAPS: ORAL_CAPSULE | ORAL | 5 refills | Status: DC

## 2021-01-30 ENCOUNTER — Other Ambulatory Visit: Payer: Self-pay | Admitting: *Deleted

## 2021-01-30 MED ORDER — METHYLPREDNISOLONE 4 MG PO TBPK: ORAL_TABLET | ORAL | 0 refills | Status: DC

## 2021-02-04 ENCOUNTER — Ambulatory Visit: Payer: BC Managed Care – PPO | Admitting: Neurology

## 2021-02-04 ENCOUNTER — Encounter: Payer: Self-pay | Admitting: Neurology

## 2021-02-04 VITALS — BP 141/82 | HR 71 | Wt 273.0 lb

## 2021-02-04 DIAGNOSIS — M47816 Spondylosis without myelopathy or radiculopathy, lumbar region: Secondary | ICD-10-CM | POA: Diagnosis not present

## 2021-02-04 DIAGNOSIS — M5430 Sciatica, unspecified side: Secondary | ICD-10-CM

## 2021-02-04 DIAGNOSIS — R200 Anesthesia of skin: Secondary | ICD-10-CM | POA: Diagnosis not present

## 2021-02-04 DIAGNOSIS — M542 Cervicalgia: Secondary | ICD-10-CM

## 2021-02-04 DIAGNOSIS — G43709 Chronic migraine without aura, not intractable, without status migrainosus: Secondary | ICD-10-CM

## 2021-02-04 NOTE — Progress Notes (Signed)
[facility]  PATIENT: Pamela Murphy DOB: [birth year] REASON FOR VISIT: Chronic Migraine headaches   HISTORICAL  CHIEF COMPLAINT:  Chief Complaint  Patient presents with  . Follow-up    RM 12 with husband.     HISTORY OF PRESENT ILLNESS:  Pamela Murphy is a 50 y.o. woman with a chronic migraine disorder, obstructive sleep apnea and abnormal MRI of the brain.   Update 02/04/2021 She is experiencing LBP and leg pain radiating to the left lower inner leg.    Gabapentin at bedtime has helped the leg pain but not the back pain.      She also has uncomfortable tingling in the hands - like falling asleep.    She has pain in the right upper arm.  MRI of the cervical spine from 2015 that showed some mild degenerative changes at C5-C6 and C6-C7 but no nerve root compression.  A steroid pack helped the numbness in the hands some.   Unfortunately sugars went up and she gained a few pounds.   She also notes some numbness in her cheeks (bilateral)  She had Facet RFA (left 07/2020 and right 09/2020) with benefit.     She has more fatigue despite sleeping 8 hours and using CPAP.   Only rare snoring.   She denies sleepiness during the day.   Update 12/24/2020 From migraine headaches have done very well since her last Botox therapy about 3 months ago.  She did not note any eyelid drooping.  She has had only a few migraines.  She notes the new job is less stressful than the old job.  When the migraine occurs, she takes indomethacin or a triptan.     Occasionally she needs to take a Percocet if the response is not complete.  Neck pain is also improved with Botox.  When she gets her more typical migraine headaches she has nausea, photophobia and phonophobia.  Additionally, she has OSA and uses CPAP.    Download last year showed 100% compliance with excellent efficacy (AHI of 0.8).  She sleeps better when she uses CPAP than when she does not.  She feels refreshed in the morning.  She had a  repeat radiofrequency ablation a couple months ago and feels that her lower back pain is much better back pain improved with radiofrequency ablation but she still has some back pain at times.  She had radiofrequency ablation by Dr. Jola Baptist October 201.  MRI of the lumbar spine has shown left greater than right facet hypertrophy at L4-L5 and L5-S1.   Migraine history:  She has a long history of chronic migraines that were occurring almost every day (25-28 days/month) for 4 or more hours a day. Multiple medications were tried for prophylactic and abortive therapies with suboptimal response.    In the past, she has failed multiple medications including:  Antiepileptics (Topamax, Keppra, zonisamide), anti-depressants (Cymbalta, Prozac), multiple NSAID's, betas blockers (Inderal, atenolol), muscle relaxants (soma, tizanidine, cyclobenzaprine) in various combinations.   Once the headache occurs, she has tried triptan's with incomplete benefit much of the time. She has also tried opiates.   IM Toradol has been most effective when one occurs.  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   I "undiagnosed her" around 2010 and she got a second opinion from Dr. Terie Purser who also concurred.  MRI shows small round foci predominantly in the subcortical and deep white matter in a nonspecific  manner.  Her last MRI of the brain was in 2016 and was stable compared to the previous ones.  She had had paresthesias and fatigue in the past that contributed to her diagnosis.   We have discussed that she has been stable times many years off therapy that MS is significantly less likely.   REVIEW OF SYSTEMS:  Constitutional: No fevers, chills, sweats, or change in appetite Eyes: No visual changes, double vision, eye pain Ear, nose and throat: No hearing loss, ear pain, nasal congestion, sore throat Cardiovascular: No chest pain, palpitations Respiratory:  see above.  She has  OSA. GastrointestinaI: No nausea, vomiting, diarrhea, abdominal pain, fecal incontinence Genitourinary:  No dysuria, urinary retention or frequency.  No nocturia.   Dysfunctional uterine bleeding Musculoskeletal:  notes low back pain that has been worse the last couple months and neck pain that is helped by Botox Integumentary: No rash, pruritus, skin lesions Neurological: as above Psychiatric: No depression at this time.  No anxiety Endocrine: No palpitations, diaphoresis, change in appetite, change in weigh or increased thirst Hematologic/Lymphatic:   She has anemia due to dysfunctional uterine bleeding. Allergic/Immunologic: No itchy/runny eyes, nasal congestion, recent allergic reactions, rashes  ALLERGIES: Allergies  Allergen Reactions  . Cefaclor Swelling and Rash     neck swelling  . Prochlorperazine Maleate Rash and Other (See Comments)     jittery;tachycardia  . Bupropion Anxiety  . Sulfamethoxazole-Trimethoprim Nausea Only  . Tessalon Perles [Benzonatate] Rash    HOME MEDICATIONS: Outpatient Medications Prior to Visit  Medication Sig Dispense Refill  . atenolol (TENORMIN) 25 MG tablet Take 1 tablet (25 mg total) by mouth daily. 90 tablet 3  . Botulinum Toxin Type A (BOTOX) 200 units SOLR MD to inject IM every 3 mos. for migraine headaches 1 each 3  . empagliflozin (JARDIANCE) 25 MG TABS tablet Take 25 mg by mouth daily.    Marland Kitchen gabapentin (NEURONTIN) 300 MG capsule Take 1-2 capsules at bedtime 60 capsule 5  . indomethacin (INDOCIN) 25 MG capsule TAKE 1 CAPSULE (25 MG TOTAL) BY MOUTH 2 (TWO) TIMES DAILY WITH A MEAL. 60 capsule 3  . meloxicam (MOBIC) 15 MG tablet TAKE 1 TABLET BY MOUTH EVERY DAY 30 tablet 1  . methylPREDNISolone (MEDROL DOSEPAK) 4 MG TBPK tablet Take 6 tablets on day 1 and decrease by 1 tablet each day until finished 21 tablet 0  . Multiple Vitamins-Minerals (MULTIVITAMIN WITH MINERALS) tablet Take by mouth.    . ondansetron (ZOFRAN-ODT) 8 MG disintegrating  tablet Take 1 tablet (8 mg total) by mouth every 8 (eight) hours as needed for nausea or vomiting. 30 tablet 2  . oxyCODONE-acetaminophen (PERCOCET) 10-325 MG tablet Take 1 tablet by mouth every 8 (eight) hours as needed for pain. 1 pill every 4-6 hours as needed for breakthrough pain as needed 90 tablet 0  . pantoprazole (PROTONIX) 40 MG tablet Take 40 mg by mouth 2 (two) times daily.     Marland Kitchen PNEUMOCOCCAL VAC POLYVALENT IJ Inject as directed. Received in January 2021    . rizatriptan (MAXALT-MLT) 10 MG disintegrating tablet TAKE 1 TABLET BY MOUTH AS NEEDED FOR MIGRAINE. MAY REPEAT IN 2 HOURS IF NEEDED 12 tablet 11  . Semaglutide, 1 MG/DOSE, (OZEMPIC, 1 MG/DOSE,) 4 MG/3ML SOPN Inject 1 mg into the skin every 7 (seven) days.    Marland Kitchen tiZANidine (ZANAFLEX) 4 MG tablet TAKE 1 TABLET BY MOUTH THREE TIMES A DAY 90 tablet 0   No facility-administered medications prior to visit.  PAST MEDICAL HISTORY: Past Medical History:  Diagnosis Date  . Headache   . Hypertension   . Type 2 diabetes mellitus ([place]) 06/08/2018  . Vision abnormalities     PAST SURGICAL HISTORY: Past Surgical History:  Procedure Laterality Date  . SINUS IRRIGATION      FAMILY HISTORY: Family History  Problem Relation Age of Onset  . Breast cancer Mother   . Melanoma Mother   . Atrial fibrillation Mother   . Supraventricular tachycardia Mother   . Heart attack Father   . Hypertension Father   . Hyperlipidemia Father     SOCIAL HISTORY:  Social History   Socioeconomic History  . Marital status: Single    Spouse name: Not on file  . Number of children: Not on file  . Years of education: Not on file  . Highest education level: Not on file  Occupational History    Comment: full time  Tobacco Use  . Smoking status: Never Smoker  . Smokeless tobacco: Never Used  Substance and Sexual Activity  . Alcohol use: No    Alcohol/week: 0.0 standard drinks    Comment: Rare  . Drug use: No  . Sexual activity: Not on file   Other Topics Concern  . Not on file  Social History Narrative   Lives with spouse   Right Handed   Drinks 1-2 cups caffeine daily   Social Determinants of Health   Financial Resource Strain: Not on file  Food Insecurity: Not on file  Transportation Needs: Not on file  Physical Activity: Not on file  Stress: Not on file  Social Connections: Not on file  Intimate Partner Violence: Not on file     PHYSICAL EXAM  Vitals:   02/04/21 0807  BP: (!) 141/82  Pulse: 71  SpO2: 97%  Weight: 273 lb (123.8 kg)    Body mass index is 44.06 kg/m.   General: The patient is well-developed and well-nourished and in no acute distress   Musculoskeletal:   The neck has good range of motion.  No significant neck tenderness...  Range of motion was normal.  Neurologic Exam  Mental status: The patient is alert and oriented x 3 at the time of the examination.  Speech is normal.  Cranial nerves: Extraocular movements are full.  Facial strength and sensation was normal.  Trapezius strength was normal.  No obvious hearing deficits are noted.  Motor:   Muscle tone and bulk is normal. She has normal strength.    Sensory: She had mild Phalen signs at the wrist and mild Tinel signs at the elbows (ulnar) sensation was fairly normal and symmetric to touch.  Deep tendon reflexes: Normal and symmetric reflexes in the arms and legs.  Gait and station: Station and gait are normal.        ASSESSMENT AND PLAN    1. Sciatica, unspecified laterality   2. Neck pain   3. Arm numbness   4. Facet syndrome, lumbar   5. Chronic migraine w/o aura, not intractable, w/o stat migr          1.   She will take gabapentin up to 300 mg po tid.  If she cannot tolerate this or symptoms worsen she will let us know. 2.   If amr symptoms consider we will check MRI cervical and consider EMG/NCV 3.    LEg pain likely left S1 radic - consider SI if it worsens 4.   Continue to use CPAP nightly for OSA.  5.  Return to see me in about 3 months for the next Botox injection, or sooner if she has new or worsening neurologic symptoms.    Richard A. Felecia Shelling, MD, PhD 4/82/7078, 6:75 AM Certified in Neurology, Clinical Neurophysiology, Sleep Medicine, Pain Medicine and Neuroimaging  [facility] [address] [place], [place] [address] [phone]

## 2021-02-15 ENCOUNTER — Other Ambulatory Visit: Payer: Self-pay | Admitting: Neurology

## 2021-02-24 ENCOUNTER — Other Ambulatory Visit: Payer: Self-pay | Admitting: Neurology

## 2021-02-24 DIAGNOSIS — M79641 Pain in right hand: Secondary | ICD-10-CM

## 2021-02-24 DIAGNOSIS — R200 Anesthesia of skin: Secondary | ICD-10-CM

## 2021-03-05 ENCOUNTER — Telehealth: Payer: Self-pay | Admitting: Neurology

## 2021-03-05 NOTE — Telephone Encounter (Signed)
Patient's next Botox appointment is 5/3. Received (1) 200 unit vial of Botox today from [facility].

## 2021-03-11 ENCOUNTER — Encounter: Payer: BC Managed Care – PPO | Admitting: Neurology

## 2021-03-11 ENCOUNTER — Other Ambulatory Visit: Payer: Self-pay

## 2021-03-11 ENCOUNTER — Ambulatory Visit (INDEPENDENT_AMBULATORY_CARE_PROVIDER_SITE_OTHER): Payer: BC Managed Care – PPO | Admitting: Neurology

## 2021-03-11 ENCOUNTER — Encounter: Payer: Self-pay | Admitting: Neurology

## 2021-03-11 DIAGNOSIS — M5412 Radiculopathy, cervical region: Secondary | ICD-10-CM | POA: Diagnosis not present

## 2021-03-11 DIAGNOSIS — Z0289 Encounter for other administrative examinations: Secondary | ICD-10-CM

## 2021-03-11 DIAGNOSIS — G43709 Chronic migraine without aura, not intractable, without status migrainosus: Secondary | ICD-10-CM | POA: Diagnosis not present

## 2021-03-11 DIAGNOSIS — M79641 Pain in right hand: Secondary | ICD-10-CM

## 2021-03-11 DIAGNOSIS — R200 Anesthesia of skin: Secondary | ICD-10-CM | POA: Diagnosis not present

## 2021-03-11 DIAGNOSIS — M47816 Spondylosis without myelopathy or radiculopathy, lumbar region: Secondary | ICD-10-CM

## 2021-03-11 DIAGNOSIS — G4733 Obstructive sleep apnea (adult) (pediatric): Secondary | ICD-10-CM

## 2021-03-11 NOTE — Progress Notes (Signed)
Full Name:   Pamela Murphy Gender: Female MRN #:    [id number] Date of Birth: [birth year]    Visit Date: 03/11/2021 08:16 Age: 50 Years Examining Physician: Arlice Colt, MD  Referring Physician: Arlice Colt, MD   History: Pamela Murphy is a 50 year old woman with bilateral arm numbness, tingling and pain, right greater than left.  Additionally, she has chronic migraines and lumbar degenerative changes.  MRI of the cervical spine in 2015 showed mild degenerative changes at C5-C6 and C6-C7 but no nerve root compression.  She has no recent imaging studies.  On exam, strength and sensation were normal but she had positive Phalen's sign (bilateral) and Tinel signs at the right wrist and both elbows.  Nerve conduction studies: Bilateral median and ulnar motor responses had normal distal latencies, amplitudes and conduction velocities.  Ulnar F-wave latencies were normal.  The right median sensory response was mildly delayed with a normal amplitude across the wrist.  The left median sensory response was normal.  Electromyography: Needle EMG of selected muscles of both arms was performed.  On the left, she had mild chronic denervation in the triceps and EDC muscles.  On the right, no significant abnormalities were noted.  There was no abnormal spontaneous activity in any of the muscles tested.  Impression: This NCV/EMG study shows the following: 1.   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   Left chronic C7 radiculopathy without active features.  Pamela Fleites A. Felecia Shelling, MD, PhD, FAAN Certified in Neurology, Clinical Neurophysiology, Sleep Medicine, Pain Medicine and Neuroimaging Director, [facility] [address], [place], [place] [address] [phone]  Verbal informed consent was obtained from the patient, patient was informed of potential risk of procedure,  including bruising, bleeding, hematoma formation, infection, muscle weakness, muscle pain, numbness, among others.        [place]    Nerve / Sites Muscle Latency Ref. Amplitude Ref. Rel Amp Segments Distance Velocity Ref. Area    ms ms mV mV %  cm m/s m/s mVms  L Median - APB     Wrist APB 3.3 ?4.4 8.9 ?4.0 100 Wrist - APB 7   33.4     Upper arm APB 7.3  8.4  94.4 Upper arm - Wrist 21 52 ?49 31.3  R Median - APB     Wrist APB 3.7 ?4.4 7.0 ?4.0 100 Wrist - APB 7   27.6     Upper arm APB 7.8  6.7  96.4 Upper arm - Wrist 21 52 ?49 27.5  L Ulnar - ADM     Wrist ADM 2.6 ?3.3 9.4 ?6.0 100 Wrist - ADM 7   36.7     B.Elbow ADM 6.0  8.6  91.8 B.Elbow - Wrist 19 56 ?49 36.3     A.Elbow ADM 7.8  8.3  96.1 A.Elbow - B.Elbow 10 56 ?49 36.0  R Ulnar - ADM     Wrist ADM 2.6 ?3.3 11.4 ?6.0 100 Wrist - ADM 7   37.5     B.Elbow ADM 5.5  10.0  87.5 B.Elbow - Wrist 19 65 ?49 34.6     A.Elbow ADM 7.1  10.3  104 A.Elbow - B.Elbow 10 65 ?49 36.1             SNC    Nerve / Sites Rec. Site Peak Lat Ref.  Amp Ref. Segments Distance Peak  Diff Ref.    ms ms V V  cm ms ms  L Median, Ulnar - Transcarpal comparison     Median Palm Wrist 2.2 ?2.2 48 ?35 Median Palm - Wrist 8       Ulnar Palm Wrist 1.8 ?2.2 21 ?12 Ulnar Palm - Wrist 8          Median Palm - Ulnar Palm  0.4 ?0.4  R Median, Ulnar - Transcarpal comparison     Median Palm Wrist 2.6 ?2.2 47 ?35 Median Palm - Wrist 8       Ulnar Palm Wrist 1.8 ?2.2 22 ?12 Ulnar Palm - Wrist 8          Median Palm - Ulnar Palm  0.8 ?0.4  L Median - Orthodromic (Dig II, Mid palm)     Dig II Wrist 3.1 ?3.4 11 ?10 Dig II - Wrist 13    R Median - Orthodromic (Dig II, Mid palm)     Dig II Wrist 3.6 ?3.4 12 ?10 Dig II - Wrist 13    L Ulnar - Orthodromic, (Dig V, Mid palm)     Dig V Wrist 2.7 ?3.1 6 ?5 Dig V - Wrist 11    R Ulnar - Orthodromic, (Dig V, Mid palm)     Dig V Wrist 2.5 ?3.1 8 ?5 Dig V - Wrist 27                   F  Wave    Nerve F Lat Ref.   ms ms  L  Ulnar - ADM 27.7 ?32.0  R Ulnar - ADM 25.7 ?32.0         EMG Summary Table    Spontaneous MUAP Recruitment  Muscle IA Fib PSW Fasc Other Amp Dur. Poly Pattern  R. Deltoid Normal None None None _______ Normal Normal Normal Normal  R. Triceps brachii Normal None None None _______ Normal Normal Normal Normal  R. Biceps brachii Normal None None None _______ Normal Normal Normal Normal  R. Extensor digitorum communis Normal None None None _______ Normal Normal Normal Normal  R. Flexor carpi ulnaris Normal None None None _______ Normal Normal Normal Normal  R. First dorsal interosseous Normal None None None _______ Normal Normal Normal Normal  R. Abductor pollicis brevis Normal None None None _______ Normal Normal Normal Normal  L. Deltoid Normal None None None _______ Normal Normal Normal Normal  L. Biceps brachii Normal None None None _______ Normal Normal Normal Normal  L. Triceps brachii Normal None None None _______ Normal Normal 1+ Reduced  L. Extensor digitorum communis Normal None None None _______ Normal Increased 1+ Reduced  L. First dorsal interosseous Normal None None None _______ Normal Normal Normal Normal  L. Abductor pollicis brevis Normal None None None _______ Normal Normal Normal Normal  L. Flexor carpi ulnaris Normal None None None _______ Normal Normal Normal Normal           [facility]  PATIENT: Pamela Murphy DOB: 1971/09/05 REASON FOR VISIT: Chronic Migraine headaches   HISTORICAL  CHIEF COMPLAINT:  Chief Complaint  Patient presents with  . Numbness  . Headache  . Obstructive Sleep Apnea  . Back Pain    HISTORY OF PRESENT ILLNESS:  Pamela Murphy is a 50 y.o. woman with a chronic migraine disorder, obstructive sleep apnea and abnormal MRI of the brain.   Update 03/11/2021 Pamela Murphy is a 50 year old woman with bilateral arm numbness, tingling and pain, right greater  than left.  Additionally, she has chronic migraines and lumbar  degenerative changes.  MRI of the cervical spine in 2015 showed mild degenerative changes at C5-C6 and C6-C7 but no nerve root compression.  She has no recent imaging studies.  On exam, strength and sensation were normal but she had positive Phalen's sign (bilateral) and Tinel signs at the right wrist and both elbows.She also has uncomfortable tingling in the hands - like falling asleep.    She has pain in the right upper arm.  MRI of the cervical spine from 2015 that showed some mild degenerative changes at C5-C6 and C6-C7 but no nerve root compression.  A steroid pack helped the numbness in the hands some.   Unfortunately sugars went up and she gained a few pounds.   She also notes some numbness in her cheeks (bilateral).   Today, the NCV/EMG study showed mild right median neuropathy across the wrist and mild left chronic C7 radiculopathy.  She is experiencing a severe headache that started about 2 hours before the appointment.  She has photophobia and phonophobia.  She is experiencing some nausea but no vomiting.  Pain is worse with movement.  She did not take any medicine yet.  Her back is doing much better.  She had Facet RFA (left 07/2020 and right 09/2020) with benefit.     She has more fatigue despite sleeping 8 hours and using CPAP.   Only rare snoring.   She denies sleepiness during the day.    Migraine history:  She has a long history of chronic migraines that were occurring almost every day (25-28 days/month) for 4 or more hours a day. Multiple medications were tried for prophylactic and abortive therapies with suboptimal response.    In the past, she has failed multiple medications including:  Antiepileptics (Topamax, Keppra, zonisamide), anti-depressants (Cymbalta, Prozac), multiple NSAID's, betas blockers (Inderal, atenolol), muscle relaxants (soma, tizanidine, cyclobenzaprine) in various combinations.   Once the headache occurs, she has tried triptan's with incomplete benefit much of the  time. She has also tried opiates.   IM Toradol has been most effective when one occurs.  Abnormal brain MRI:  She was diagnosed with MS in the past and was on interferon therapy for several years.   I "undiagnosed her" around 2010 and she got a second opinion from Dr. Terie Purser who also concurred.  MRI shows small round foci predominantly in the subcortical and deep white matter in a nonspecific manner.  Her last MRI of the brain was in 2016 and was stable compared to the previous ones.  She had had paresthesias and fatigue in the past that contributed to her diagnosis.   We have discussed that she has been stable times many years off therapy that MS is significantly less likely.   REVIEW OF SYSTEMS:  Constitutional: No fevers, chills, sweats, or change in appetite Eyes: No visual changes, double vision, eye pain Ear, nose and throat: No hearing loss, ear pain, nasal congestion, sore throat Cardiovascular: No chest pain, palpitations Respiratory:  see above.  She has OSA. GastrointestinaI: No nausea, vomiting, diarrhea, abdominal pain, fecal incontinence Genitourinary:  No dysuria, urinary retention or frequency.  No nocturia.   Dysfunctional uterine bleeding Musculoskeletal:  notes low back pain that has been worse the last couple months and neck pain that is helped by Botox Integumentary: No rash, pruritus, skin lesions Neurological: as above Psychiatric: No depression at this time.  No anxiety Endocrine: No palpitations, diaphoresis, change in appetite, change in  weigh or increased thirst Hematologic/Lymphatic:   She has anemia due to dysfunctional uterine bleeding. Allergic/Immunologic: No itchy/runny eyes, nasal congestion, recent allergic reactions, rashes  ALLERGIES: Allergies  Allergen Reactions  . Cefaclor Swelling and Rash     neck swelling  . Prochlorperazine Maleate Rash and Other (See Comments)     jittery;tachycardia  . Bupropion Anxiety  . Sulfamethoxazole-Trimethoprim  Nausea Only  . Tessalon Perles [Benzonatate] Rash    HOME MEDICATIONS: Outpatient Medications Prior to Visit  Medication Sig Dispense Refill  . atenolol (TENORMIN) 25 MG tablet TAKE 1 TABLET BY MOUTH EVERY DAY 90 tablet 3  . Botulinum Toxin Type A (BOTOX) 200 units SOLR MD to inject IM every 3 mos. for migraine headaches 1 each 3  . empagliflozin (JARDIANCE) 25 MG TABS tablet Take 25 mg by mouth daily.    Marland Kitchen gabapentin (NEURONTIN) 300 MG capsule Take 1-2 capsules at bedtime 60 capsule 5  . indomethacin (INDOCIN) 25 MG capsule TAKE 1 CAPSULE (25 MG TOTAL) BY MOUTH 2 (TWO) TIMES DAILY WITH A MEAL. 60 capsule 3  . meloxicam (MOBIC) 15 MG tablet TAKE 1 TABLET BY MOUTH EVERY DAY 30 tablet 1  . methylPREDNISolone (MEDROL DOSEPAK) 4 MG TBPK tablet Take 6 tablets on day 1 and decrease by 1 tablet each day until finished 21 tablet 0  . Multiple Vitamins-Minerals (MULTIVITAMIN WITH MINERALS) tablet Take by mouth.    . ondansetron (ZOFRAN-ODT) 8 MG disintegrating tablet Take 1 tablet (8 mg total) by mouth every 8 (eight) hours as needed for nausea or vomiting. 30 tablet 2  . oxyCODONE-acetaminophen (PERCOCET) 10-325 MG tablet Take 1 tablet by mouth every 8 (eight) hours as needed for pain. 1 pill every 4-6 hours as needed for breakthrough pain as needed 90 tablet 0  . pantoprazole (PROTONIX) 40 MG tablet Take 40 mg by mouth 2 (two) times daily.     Marland Kitchen PNEUMOCOCCAL VAC POLYVALENT IJ Inject as directed. Received in January 2021    . rizatriptan (MAXALT-MLT) 10 MG disintegrating tablet TAKE 1 TABLET BY MOUTH AS NEEDED FOR MIGRAINE. MAY REPEAT IN 2 HOURS IF NEEDED 12 tablet 11  . Semaglutide, 1 MG/DOSE, (OZEMPIC, 1 MG/DOSE,) 4 MG/3ML SOPN Inject 1 mg into the skin every 7 (seven) days.    Marland Kitchen tiZANidine (ZANAFLEX) 4 MG tablet TAKE 1 TABLET BY MOUTH THREE TIMES A DAY 90 tablet 0   No facility-administered medications prior to visit.    PAST MEDICAL HISTORY: Past Medical History:  Diagnosis Date  . Headache    . Hypertension   . Type 2 diabetes mellitus ([place]) 06/08/2018  . Vision abnormalities     PAST SURGICAL HISTORY: Past Surgical History:  Procedure Laterality Date  . SINUS IRRIGATION      FAMILY HISTORY: Family History  Problem Relation Age of Onset  . Breast cancer Mother   . Melanoma Mother   . Atrial fibrillation Mother   . Supraventricular tachycardia Mother   . Heart attack Father   . Hypertension Father   . Hyperlipidemia Father     SOCIAL HISTORY:  Social History   Socioeconomic History  . Marital status: Single    Spouse name: Not on file  . Number of children: Not on file  . Years of education: Not on file  . Highest education level: Not on file  Occupational History    Comment: full time  Tobacco Use  . Smoking status: Never Smoker  . Smokeless tobacco: Never Used  Substance and Sexual Activity  .  Alcohol use: No    Alcohol/week: 0.0 standard drinks    Comment: Rare  . Drug use: No  . Sexual activity: Not on file  Other Topics Concern  . Not on file  Social History Narrative   Lives with spouse   Right Handed   Drinks 1-2 cups caffeine daily   Social Determinants of Health   Financial Resource Strain: Not on file  Food Insecurity: Not on file  Transportation Needs: Not on file  Physical Activity: Not on file  Stress: Not on file  Social Connections: Not on file  Intimate Partner Violence: Not on file     PHYSICAL EXAM  There were no vitals filed for this visit.  There is no height or weight on file to calculate BMI.   General: The patient is well-developed and well-nourished and in no acute distress   Musculoskeletal:   The neck has good range of motion.  No significant neck tenderness...  Range of motion was normal.  Neurologic Exam  Mental status: The patient is alert and oriented x 3 at the time of the examination.  Speech is normal.  Cranial nerves: Extraocular movements are full.  Facial strength and sensation was normal.   Trapezius strength was normal.  No obvious hearing deficits are noted.  Motor:   Muscle tone and bulk is normal. She has normal strength.    Sensory: She had mild Phalen signs at the wrist and mild Tinel signs at the elbows (ulnar) sensation was fairly normal and symmetric to touch.  Deep tendon reflexes: Normal and symmetric reflexes in the arms and legs.  Gait and station: Station and gait are normal.        ASSESSMENT AND PLAN    1. Chronic migraine w/o aura, not intractable, w/o stat migr   2. Hand pain, right   3. Numbness of right hand   4. Facet hypertrophy of lumbar region   5. Obstructive sleep apnea          1.   Continue gabapentin 300 mg po tid. we can increase further if she continues to tolerate it. 2.   She has a C7 radiculopathy on the left.  Since symptoms are worsening we will check MRI cervical spine.   3.   She also has a carpal tunnel on the right.  It is mild.  She can wear splints at night.  If it worsens we can refer to surgery..  4.   Return to see me for the next Botox injection, or sooner if she has new or worsening neurologic symptoms.    Mikhail Hallenbeck A. Felecia Shelling, MD, PhD 3/40/3524, 8:18 AM Certified in Neurology, Clinical Neurophysiology, Sleep Medicine, Pain Medicine and Neuroimaging  [facility] [address] [place], [place] [address] [phone]

## 2021-03-11 NOTE — Addendum Note (Signed)
Addended by: Arlice Colt A on: 03/11/2021 12:55 PM   Modules accepted: Orders

## 2021-03-12 ENCOUNTER — Telehealth: Payer: Self-pay | Admitting: Neurology

## 2021-03-12 NOTE — Telephone Encounter (Signed)
spoke to the patient she will call me back to schedule. She asked if she could have her Botox and MRI on the same day and I informed her due to insurance reasoning we cannot do that on the same day. And then if she had her Botox first we would have to wait 10 days after to have the MRI.  Dillon Bjork: [id number] (exp. 03/12/21 to 09/07/21)

## 2021-03-17 NOTE — Telephone Encounter (Signed)
Patient left a voicemail on my phone wanting to schedule her MRI. I called her back and left her a voicemail to call me back to schedule her MRI.

## 2021-03-18 NOTE — Telephone Encounter (Signed)
Patient called back yesterday 03/17/21 she is scheduled at [facility] for 04/15/21.  MR Cervical spine wo contrast Dr. Cheree Ditto Josem Kaufmann: [id number] (exp. 03/12/21 to 09/07/21) [name] [name] Josem Kaufmann: [place] ref # [id number]

## 2021-03-19 ENCOUNTER — Other Ambulatory Visit: Payer: Self-pay | Admitting: *Deleted

## 2021-03-19 MED ORDER — OXYCODONE-ACETAMINOPHEN 10-325 MG PO TABS: 1.0000 | ORAL_TABLET | Freq: Three times a day (TID) | ORAL | 0 refills | Status: DC | PRN

## 2021-03-19 NOTE — Telephone Encounter (Signed)
Checked drug registry and she last refilled oxycodone 11/07/20 #90. Last seen 02/04/21 and next follow up 03/25/21.

## 2021-03-20 ENCOUNTER — Telehealth: Payer: Self-pay | Admitting: *Deleted

## 2021-03-20 NOTE — Telephone Encounter (Signed)
Submitted PA oxycodone on CMM. Key: [id number]. Waiting on determination from [place].

## 2021-03-20 NOTE — Telephone Encounter (Signed)
Received fax from [place] that [employer] approved 03/20/21-09/19/21. PA# [place] [id number] Non-grandfathered [id number].  Faxed notice of approval to [facility] at [phone]. Received fax confirmation.

## 2021-03-21 DIAGNOSIS — Z1231 Encounter for screening mammogram for malignant neoplasm of breast: Principal | ICD-10-CM

## 2021-03-23 ENCOUNTER — Other Ambulatory Visit: Payer: Self-pay | Admitting: Neurology

## 2021-03-25 ENCOUNTER — Ambulatory Visit (INDEPENDENT_AMBULATORY_CARE_PROVIDER_SITE_OTHER): Payer: BC Managed Care – PPO | Admitting: Neurology

## 2021-03-25 VITALS — BP 159/99 | HR 89 | Ht 66.0 in | Wt 278.5 lb

## 2021-03-25 DIAGNOSIS — M47816 Spondylosis without myelopathy or radiculopathy, lumbar region: Secondary | ICD-10-CM

## 2021-03-25 DIAGNOSIS — M542 Cervicalgia: Secondary | ICD-10-CM | POA: Diagnosis not present

## 2021-03-25 DIAGNOSIS — G43709 Chronic migraine without aura, not intractable, without status migrainosus: Secondary | ICD-10-CM | POA: Diagnosis not present

## 2021-03-25 DIAGNOSIS — R200 Anesthesia of skin: Secondary | ICD-10-CM | POA: Diagnosis not present

## 2021-03-25 DIAGNOSIS — M5412 Radiculopathy, cervical region: Secondary | ICD-10-CM

## 2021-03-25 MED ORDER — KETOROLAC TROMETHAMINE 60 MG/2ML IM SOLN
60.0000 mg | Freq: Once | INTRAMUSCULAR | Status: AC
  Administered 2021-03-25: 60 mg via INTRAMUSCULAR

## 2021-03-25 NOTE — Progress Notes (Signed)
[facility]  PATIENT: Pamela Murphy DOB: 30-Oct-1971 REASON FOR VISIT: Chronic Migraine headaches   HISTORICAL  CHIEF COMPLAINT:  Chief Complaint  Patient presents with  . Botulinum Toxin Injection    RM 12, alone. Last seen 12/24/2020. Here for botox for migraines. 200Ux1vial. Lot: [id number]. Exp: 10/2023. Roy: [id number].     HISTORY OF PRESENT ILLNESS:  Pamela Murphy is a 50 y.o. woman with a chronic migraine disorder, obstructive sleep apnea and abnormal MRI of the brain.   Update 03/25/2021 Her migraine headaches were doing well until couple weeks ago.  She currently has a migraine that has been present since yesterday.  It is fairly severe.  She tolerated the last Botox injection well and there was no eyelid drooping.  When migraine occurs she will take indomethacin or a triptan and Percocet if pain does not improve.  Botox also has helped the neck pain some.  She continues to experience radicular pain down the left arm.  She will be getting an MRI of the cervical spine soon.  She is wearing a splint for the carpal tunnel syndrome.  She thinks it is helping some.  She has OSA and is on CPAP.  She tolerates it well.   Download last year showed 100% compliance with excellent efficacy (AHI of 0.8).  She sleeps better when she uses CPAP than when she does not.  She feels refreshed in the morning.  She had a repeat radiofrequency ablation about 6 months ago and feels that her   MRI of the lumbar spine has shown left greater than right facet hypertrophy at L4-L5 and L5-S1.   Migraine history:  She has a long history of chronic migraines that were occurring almost every day (25-28 days/month) for 4 or more hours a day. Multiple medications were tried for prophylactic and abortive therapies with suboptimal response.    In the past, she has failed multiple medications including:  Antiepileptics (Topamax, Keppra, zonisamide), anti-depressants (Cymbalta, Prozac), multiple  NSAID's, betas blockers (Inderal, atenolol), muscle relaxants (soma, tizanidine, cyclobenzaprine) in various combinations.   Once the headache occurs, she has tried triptan's with incomplete benefit much of the time. She has also tried opiates.   IM Toradol has been most effective when one occurs.  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   I "undiagnosed her" around 2010 and she got a second opinion from Dr. Terie Purser who also concurred.  MRI shows small round foci predominantly in the subcortical and deep white matter in a nonspecific manner.  Her last MRI of the brain was in 2016 and was stable compared to the previous ones.  She had had paresthesias and fatigue in the past that contributed to her diagnosis.   We have discussed that she has been stable times many years off therapy that MS is significantly less likely.   REVIEW OF SYSTEMS:  Constitutional: No fevers, chills, sweats, or change in appetite Eyes: No visual changes, double vision, eye pain Ear, nose and throat: No hearing loss, ear pain, nasal congestion, sore throat Cardiovascular: No chest pain, palpitations Respiratory:  see above.  She has OSA. GastrointestinaI: No nausea, vomiting, diarrhea, abdominal pain, fecal incontinence Genitourinary:  No dysuria, urinary retention or frequency.  No nocturia.   Dysfunctional uterine bleeding Musculoskeletal:  notes low back pain that has been worse the last couple months and neck pain that is helped by Botox Integumentary: No rash, pruritus, skin  lesions Neurological: as above Psychiatric: No depression at this time.  No anxiety Endocrine: No palpitations, diaphoresis, change in appetite, change in weigh or increased thirst Hematologic/Lymphatic:   She has anemia due to dysfunctional uterine bleeding. Allergic/Immunologic: No itchy/runny eyes, nasal congestion, recent allergic reactions, rashes  ALLERGIES: Allergies  Allergen  Reactions  . Cefaclor Swelling and Rash     neck swelling  . Prochlorperazine Maleate Rash and Other (See Comments)     jittery;tachycardia  . Bupropion Anxiety  . Sulfamethoxazole-Trimethoprim Nausea Only  . Tessalon Perles [Benzonatate] Rash    HOME MEDICATIONS: Outpatient Medications Prior to Visit  Medication Sig Dispense Refill  . atenolol (TENORMIN) 25 MG tablet TAKE 1 TABLET BY MOUTH EVERY DAY 90 tablet 3  . Botulinum Toxin Type A (BOTOX) 200 units SOLR MD to inject IM every 3 mos. for migraine headaches 1 each 3  . empagliflozin (JARDIANCE) 25 MG TABS tablet Take 25 mg by mouth daily.    Marland Kitchen gabapentin (NEURONTIN) 300 MG capsule Take 1-2 capsules at bedtime 60 capsule 5  . indomethacin (INDOCIN) 25 MG capsule TAKE 1 CAPSULE (25 MG TOTAL) BY MOUTH 2 (TWO) TIMES DAILY WITH A MEAL. 60 capsule 3  . meloxicam (MOBIC) 15 MG tablet TAKE 1 TABLET BY MOUTH EVERY DAY 30 tablet 1  . methylPREDNISolone (MEDROL DOSEPAK) 4 MG TBPK tablet Take 6 tablets on day 1 and decrease by 1 tablet each day until finished 21 tablet 0  . Multiple Vitamins-Minerals (MULTIVITAMIN WITH MINERALS) tablet Take by mouth.    . ondansetron (ZOFRAN-ODT) 8 MG disintegrating tablet Take 1 tablet (8 mg total) by mouth every 8 (eight) hours as needed for nausea or vomiting. 30 tablet 2  . oxyCODONE-acetaminophen (PERCOCET) 10-325 MG tablet Take 1 tablet by mouth every 8 (eight) hours as needed for pain. 90 tablet 0  . PNEUMOCOCCAL VAC POLYVALENT IJ Inject as directed. Received in January 2021    . rizatriptan (MAXALT-MLT) 10 MG disintegrating tablet TAKE 1 TABLET BY MOUTH AS NEEDED FOR MIGRAINE. MAY REPEAT IN 2 HOURS IF NEEDED 12 tablet 11  . Semaglutide, 1 MG/DOSE, (OZEMPIC, 1 MG/DOSE,) 4 MG/3ML SOPN Inject 2 mg into the skin every 7 (seven) days.    Marland Kitchen tiZANidine (ZANAFLEX) 4 MG tablet TAKE 1 TABLET BY MOUTH THREE TIMES A DAY 90 tablet 0  . pantoprazole (PROTONIX) 40 MG tablet Take 40 mg by mouth 2 (two) times daily.       No facility-administered medications prior to visit.    PAST MEDICAL HISTORY: Past Medical History:  Diagnosis Date  . Headache   . Hypertension   . Type 2 diabetes mellitus ([place]) 06/08/2018  . Vision abnormalities     PAST SURGICAL HISTORY: Past Surgical History:  Procedure Laterality Date  . SINUS IRRIGATION      FAMILY HISTORY: Family History  Problem Relation Age of Onset  . Breast cancer Mother   . Melanoma Mother   . Atrial fibrillation Mother   . Supraventricular tachycardia Mother   . Heart attack Father   . Hypertension Father   . Hyperlipidemia Father     SOCIAL HISTORY:  Social History   Socioeconomic History  . Marital status: Single    Spouse name: Not on file  . Number of children: Not on file  . Years of education: Not on file  . Highest education level: Not on file  Occupational History    Comment: full time  Tobacco Use  . Smoking status: Never Smoker  .  Smokeless tobacco: Never Used  Substance and Sexual Activity  . Alcohol use: No    Alcohol/week: 0.0 standard drinks    Comment: Rare  . Drug use: No  . Sexual activity: Not on file  Other Topics Concern  . Not on file  Social History Narrative   Lives with spouse   Right Handed   Drinks 1-2 cups caffeine daily   Social Determinants of Health   Financial Resource Strain: Not on file  Food Insecurity: Not on file  Transportation Needs: Not on file  Physical Activity: Not on file  Stress: Not on file  Social Connections: Not on file  Intimate Partner Violence: Not on file     PHYSICAL EXAM  Vitals:   03/25/21 1503  BP: (!) 159/99  Pulse: 89  Weight: 278 lb 8 oz (126.3 kg)  Height: 5\' 6"  (1.676 m)    Body mass index is 44.95 kg/m.   General: The patient is well-developed and well-nourished and in no acute distress   Musculoskeletal:   Her neck has a good ROM. Mild tender splenius capitus  Neurologic Exam  Mental status: The patient is alert and oriented x 3 at  the time of the examination.  Speech is normal.  Cranial nerves: Extraocular movements are full.  Facial strength and sensation was normal.  Trapezius strength was normal.  No obvious hearing deficits are noted.  Motor:   Muscle tone and bulk is normal. She has normal strength.    Gait and station: Station and gait are normal.        ASSESSMENT AND PLAN    1. Chronic migraine w/o aura, not intractable, w/o stat migr   2. Facet hypertrophy of lumbar region   3. Numbness of right hand   4. Neck pain   5. Cervical radiculopathy at C7          1.   Inject 172 Units Botox (7.5 units wasted) :   Frontalis (2.5 U x 4), nasalis/corrugators (2.5 U x 3), temporalis  (5 U x 9 - 5R, 4L), occipitalis (5 U x 6), splenius capitis (15U x 2), trapezius (15 U x 2), C6C7 paraspinal muscles (10 U x 2), wasted 28 U 2.   When a migraine occurs, take indomethacin,  Maxalt or Percocet as needed. 3.   Continue to use CPAP nightly for OSA.  4.   MRI cervical pending - based on results may need ESI/referral.   5.   Return to see me in about 3 months for the next Botox injection, or sooner if she has new or worsening neurologic symptoms.    Pranish Akhavan A. Felecia Shelling, MD, PhD 04/30/1274, 1:70 PM Certified in Neurology, Clinical Neurophysiology, Sleep Medicine, Pain Medicine and Neuroimaging  [facility] [address], [place], [place] [address] [phone]

## 2021-04-15 ENCOUNTER — Other Ambulatory Visit: Payer: Self-pay

## 2021-04-15 ENCOUNTER — Ambulatory Visit (INDEPENDENT_AMBULATORY_CARE_PROVIDER_SITE_OTHER): Payer: BC Managed Care – PPO

## 2021-04-15 DIAGNOSIS — M5412 Radiculopathy, cervical region: Secondary | ICD-10-CM | POA: Diagnosis not present

## 2021-04-22 ENCOUNTER — Other Ambulatory Visit: Payer: Self-pay | Admitting: Neurology

## 2021-04-22 DIAGNOSIS — M47816 Spondylosis without myelopathy or radiculopathy, lumbar region: Secondary | ICD-10-CM

## 2021-04-23 ENCOUNTER — Telehealth: Payer: Self-pay | Admitting: Emergency Medicine

## 2021-04-23 NOTE — Telephone Encounter (Signed)
The RFA she had 6 months ago was on the right.    She had RFA on the left September 2021.  Pain has come back.   The levels are left L3 (medial branch), left L4 (medial branch) and left L5 (dorsal ramus)

## 2021-04-23 NOTE — Telephone Encounter (Signed)
Pamela Murphy with [facility] called, she needs clarification on the order for the RFA, patient had one 6 months ago.  Also there is mention of a cervical injection (ESI), she needs to know what levels if it is also a medial block branch done prior to the RFA.  Best call back [phone]

## 2021-04-24 NOTE — Telephone Encounter (Signed)
LVM for patient to return call.  If cathy calls back, please give her this information:      The RFA she had 6 months ago was on the right.    She had RFA on the left September 2021.  Pain has come back.   The levels are left L3 (medial branch), left L4 (medial branch) and left L5 (dorsal ramus)  If she has any additional questions, please let me know.  Thanks Doroteo Bradford, Therapist, sports

## 2021-04-28 NOTE — Telephone Encounter (Signed)
[facility] and went over Dr. Garth Bigness orders.  She said she had already seen the notes.  Patient is scheduled 05/08/21 @ 1030.  Patient denied further questions, verbalized understanding and expressed appreciation for the phone call.

## 2021-04-29 ENCOUNTER — Other Ambulatory Visit: Payer: Self-pay | Admitting: Neurology

## 2021-05-01 NOTE — Telephone Encounter (Signed)
[facility]) called, need a note  from Dr. Felecia Shelling giving percentage of release 2021 RSA and plan of care.  Contact info: [phone]

## 2021-05-08 ENCOUNTER — Inpatient Hospital Stay: Admission: RE | Admit: 2021-05-08 | Payer: BC Managed Care – PPO | Source: Ambulatory Visit

## 2021-05-08 ENCOUNTER — Other Ambulatory Visit: Payer: BC Managed Care – PPO

## 2021-05-12 ENCOUNTER — Other Ambulatory Visit: Payer: Self-pay | Admitting: Neurology

## 2021-05-13 ENCOUNTER — Inpatient Hospital Stay: Admission: RE | Admit: 2021-05-13 | Payer: BC Managed Care – PPO | Source: Ambulatory Visit

## 2021-05-20 ENCOUNTER — Ambulatory Visit
Admission: RE | Admit: 2021-05-20 | Discharge: 2021-05-20 | Disposition: A | Discharge status: Home / Self Care | Payer: BC Managed Care – PPO | Source: Ambulatory Visit | Attending: Neurology | Admitting: Neurology

## 2021-05-20 ENCOUNTER — Other Ambulatory Visit: Payer: Self-pay | Admitting: Neurology

## 2021-05-20 DIAGNOSIS — M47816 Spondylosis without myelopathy or radiculopathy, lumbar region: Secondary | ICD-10-CM

## 2021-05-20 MED ORDER — KETOROLAC TROMETHAMINE 30 MG/ML IJ SOLN
30.0000 mg | Freq: Once | INTRAMUSCULAR | Status: AC
  Administered 2021-05-20: 30 mg via INTRAVENOUS

## 2021-05-20 MED ORDER — ONDANSETRON HCL 4 MG/2ML IJ SOLN
4.0000 mg | Freq: Once | INTRAMUSCULAR | Status: AC
  Administered 2021-05-20: 4 mg via INTRAVENOUS

## 2021-05-20 MED ORDER — FENTANYL CITRATE (PF) 100 MCG/2ML IJ SOLN
25.0000 ug | INTRAMUSCULAR | Status: DC | PRN
  Administered 2021-05-20 (×2): 50 ug via INTRAVENOUS

## 2021-05-20 MED ORDER — METHYLPREDNISOLONE ACETATE 40 MG/ML INJ SUSP (RADIOLOG
80.0000 mg | Freq: Once | INTRAMUSCULAR | Status: AC
  Administered 2021-05-20: 80 mg via INTRALESIONAL

## 2021-05-20 MED ORDER — MIDAZOLAM HCL 2 MG/2ML IJ SOLN
1.0000 mg | INTRAMUSCULAR | Status: DC | PRN
Start: 2021-05-20 — End: 2021-05-21
  Administered 2021-05-20 (×3): 1 mg via INTRAVENOUS

## 2021-05-20 MED ORDER — SODIUM CHLORIDE 0.9 % IV SOLN: INTRAVENOUS | Status: DC

## 2021-05-20 NOTE — Progress Notes (Signed)
Pt back in nursing recovery after post RFA. Pt alert and talking in complete sentences. Pt has no complaints at this time. Pt requested coke and cracker, given to pt. Will monitor pt until d/ced by Dr. Nelia Shi.

## 2021-05-20 NOTE — Discharge Instructions (Signed)
Radio Frequency Ablation Post Procedure Discharge Instructions ? ?May resume a regular diet and any medications that you routinely take (including pain medications). ?No driving day of procedure. ?Upon discharge go home and rest for at least 4 hours.  May use an ice pack as needed to injection sites on back. ?Remove bandades later, today. ? ? ? ?Please contact our office at [phone] for the following symptoms: ? ?Fever greater than 100 degrees ?Increased swelling, pain, or redness at injection site. ? ? ?Thank you for visiting [facility].  ?

## 2021-05-22 ENCOUNTER — Other Ambulatory Visit: Payer: Self-pay | Admitting: Neurology

## 2021-05-30 ENCOUNTER — Other Ambulatory Visit: Payer: BC Managed Care – PPO

## 2021-06-20 ENCOUNTER — Other Ambulatory Visit: Payer: Self-pay | Admitting: Neurology

## 2021-06-23 ENCOUNTER — Other Ambulatory Visit: Payer: Self-pay | Admitting: Neurology

## 2021-06-24 ENCOUNTER — Encounter: Payer: Self-pay | Admitting: Neurology

## 2021-06-24 ENCOUNTER — Ambulatory Visit (INDEPENDENT_AMBULATORY_CARE_PROVIDER_SITE_OTHER): Payer: BC Managed Care – PPO | Admitting: Neurology

## 2021-06-24 DIAGNOSIS — G4733 Obstructive sleep apnea (adult) (pediatric): Secondary | ICD-10-CM

## 2021-06-24 DIAGNOSIS — R9089 Other abnormal findings on diagnostic imaging of central nervous system: Secondary | ICD-10-CM

## 2021-06-24 DIAGNOSIS — G43709 Chronic migraine without aura, not intractable, without status migrainosus: Secondary | ICD-10-CM

## 2021-06-24 DIAGNOSIS — M47816 Spondylosis without myelopathy or radiculopathy, lumbar region: Secondary | ICD-10-CM

## 2021-06-24 MED ORDER — GABAPENTIN 300 MG PO CAPS: ORAL_CAPSULE | ORAL | 11 refills | Status: DC

## 2021-06-24 MED ORDER — TIZANIDINE HCL 4 MG PO TABS: 4.0000 mg | ORAL_TABLET | Freq: Three times a day (TID) | ORAL | 11 refills | Status: DC

## 2021-06-24 MED ORDER — RIZATRIPTAN BENZOATE 10 MG PO TBDP: ORAL_TABLET | ORAL | 11 refills | Status: DC

## 2021-06-24 NOTE — Progress Notes (Signed)
Botox- 200 units x 1 vial Lot: [id number] Expiration: 11/2023 NDC: [id number]  Bacteriostatic 0.9% Sodium Chloride- 65m total Lot: [id number]: 12/23 NDC: [id number] Dx: [id number]

## 2021-06-24 NOTE — Progress Notes (Signed)
[facility]  PATIENT: Pamela Murphy DOB: 08-28-1971 REASON FOR VISIT: Chronic Migraine headaches   HISTORICAL  CHIEF COMPLAINT:  Chief Complaint  Patient presents with   Procedure    Rm 2, alone. Here for Botox.     HISTORY OF PRESENT ILLNESS:  Pamela Murphy is a 50 y.o. woman with a chronic migraine disorder, obstructive sleep apnea and abnormal MRI of the brain.   Update 06/24/2021 Migraine headaches have generally done well since her last Botox injections 3 months ago.  She continues to experience occasional migraines.     When migraine occurs she will take indomethacin or a triptan and Percocet if pain does not improve.  Botox also has helped the neck pain some.  She tolerated fairly well and has not had any eyelid drooping since the dose was modified.  She continues to experience radicular pain down the left arm.    She has OSA and is on CPAP.  She tolerates it well.   Download last year showed 100% compliance with excellent efficacy (AHI of 0.8).  She sleeps better when she uses CPAP than when she does not.  She feels refreshed in the morning.  Her back pain os better since the RFA.    MRI of the lumbar spine has shown left greater than right facet hypertrophy at L4-L5 and L5-S1.  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    She did just start menopause.   She takes tizanidine.  Initially it helped her sleep but now even 2 pills has not helped much.      Trazodone had not helped 10-15 years ago.   She takes gabapentin 300 mgat night as well.    She has RLS, worse on the left.      Migraine history:  She has a long history of chronic migraines that were occurring almost every day (25-28 days/month) for 4 or more hours a day. Multiple medications were tried for prophylactic and abortive therapies with suboptimal response.    In the past, she has failed multiple medications including:  Antiepileptics (Topamax, Keppra, zonisamide), anti-depressants  (Cymbalta, Prozac), multiple NSAID's, betas blockers (Inderal, atenolol), muscle relaxants (soma, tizanidine, cyclobenzaprine) in various combinations.   Once the headache occurs, she has tried triptan's with incomplete benefit much of the time. She has also tried opiates.   IM Toradol has been most effective when one occurs.  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   I "undiagnosed her" around 2010 and she got a second opinion from Dr. Terie Purser who also concurred.  MRI shows small round foci predominantly in the subcortical and deep white matter in a nonspecific manner.  Her last MRI of the brain was in 2016 and was stable compared to the previous ones.  She had had paresthesias and fatigue in the past that contributed to her diagnosis.   We have discussed that she has been stable times many years off therapy that MS is significantly less likely.   REVIEW OF SYSTEMS:  Constitutional: No fevers, chills, sweats, or change in appetite Eyes: No visual changes, double vision, eye pain Ear, nose and throat: No hearing loss, ear pain, nasal congestion, sore throat Cardiovascular: No chest pain, palpitations Respiratory:  see above.  She has OSA. GastrointestinaI: No nausea, vomiting, diarrhea, abdominal pain, fecal incontinence Genitourinary:  No dysuria, urinary retention or frequency.  No nocturia.   Dysfunctional uterine bleeding Musculoskeletal:  notes low back pain that has been worse the last couple months and neck pain that is helped by Botox Integumentary: No rash, pruritus, skin lesions Neurological: as above Psychiatric: No depression at this time.  No anxiety Endocrine: No palpitations, diaphoresis, change in appetite, change in weigh or increased thirst Hematologic/Lymphatic:   She has anemia due to dysfunctional uterine bleeding. Allergic/Immunologic: No itchy/runny eyes, nasal congestion, recent allergic reactions,  rashes  ALLERGIES: Allergies  Allergen Reactions   Cefaclor Swelling and Rash     neck swelling   Prochlorperazine Maleate Rash and Other (See Comments)     jittery;tachycardia   Bupropion Anxiety   Sulfamethoxazole-Trimethoprim Nausea Only   Tessalon Perles [Benzonatate] Rash    HOME MEDICATIONS: Outpatient Medications Prior to Visit  Medication Sig Dispense Refill   atenolol (TENORMIN) 25 MG tablet TAKE 1 TABLET BY MOUTH EVERY DAY 90 tablet 3   Botulinum Toxin Type A (BOTOX) 200 units SOLR INJECT INTO THE MUSCLES BY PHYSICIAN EVERY 3 MONTHS FOR MIGRAINE HEADACHES. DISCARD UNUSED PORTION. 1 each 3   gabapentin (NEURONTIN) 300 MG capsule Take 1-2 capsules at bedtime 60 capsule 5   indomethacin (INDOCIN) 25 MG capsule TAKE 1 CAPSULE (25 MG TOTAL) BY MOUTH 2 (TWO) TIMES DAILY WITH A MEAL. 60 capsule 3   JARDIANCE 10 MG TABS tablet Take 10 mg by mouth daily.     levothyroxine (SYNTHROID) 25 MCG tablet Take 25 mcg by mouth daily before breakfast.     meloxicam (MOBIC) 15 MG tablet TAKE 1 TABLET BY MOUTH EVERY DAY 30 tablet 1   methylPREDNISolone (MEDROL DOSEPAK) 4 MG TBPK tablet Take 6 tablets on day 1 and decrease by 1 tablet each day until finished 21 tablet 0   Multiple Vitamins-Minerals (MULTIVITAMIN WITH MINERALS) tablet Take by mouth.     ondansetron (ZOFRAN-ODT) 8 MG disintegrating tablet Take 1 tablet (8 mg total) by mouth every 8 (eight) hours as needed for nausea or vomiting. 30 tablet 2   oxyCODONE-acetaminophen (PERCOCET) 10-325 MG tablet Take 1 tablet by mouth every 8 (eight) hours as needed for pain. 90 tablet 0   PNEUMOCOCCAL VAC POLYVALENT IJ Inject as directed. Received in January 2021     rizatriptan (MAXALT-MLT) 10 MG disintegrating tablet TAKE 1 TABLET BY MOUTH AS NEEDED FOR MIGRAINE. MAY REPEAT IN 2 HOURS IF NEEDED 12 tablet 11   Semaglutide, 1 MG/DOSE, (OZEMPIC, 1 MG/DOSE,) 4 MG/3ML SOPN Inject 2 mg into the skin every 7 (seven) days.     tiZANidine (ZANAFLEX) 4 MG  tablet Take 1 tablet (4 mg total) by mouth 3 (three) times daily. 90 tablet 1   empagliflozin (JARDIANCE) 25 MG TABS tablet Take 25 mg by mouth daily.     pantoprazole (PROTONIX) 40 MG tablet Take 40 mg by mouth 2 (two) times daily.      No facility-administered medications prior to visit.    PAST MEDICAL HISTORY: Past Medical History:  Diagnosis Date   Headache    Hypertension    Type 2 diabetes mellitus ([place]) 06/08/2018   Vision abnormalities     PAST SURGICAL HISTORY: Past Surgical History:  Procedure Laterality Date   SINUS IRRIGATION      FAMILY HISTORY: Family History  Problem Relation Age of Onset   Breast cancer Mother    Melanoma Mother    Atrial fibrillation Mother    Supraventricular tachycardia Mother    Heart attack Father    Hypertension Father    Hyperlipidemia Father     SOCIAL  HISTORY:  Social History   Socioeconomic History   Marital status: Married    Spouse name: Not on file   Number of children: Not on file   Years of education: Not on file   Highest education level: Not on file  Occupational History    Comment: full time  Tobacco Use   Smoking status: Never   Smokeless tobacco: Never  Substance and Sexual Activity   Alcohol use: No    Alcohol/week: 0.0 standard drinks    Comment: Rare   Drug use: No   Sexual activity: Not on file  Other Topics Concern   Not on file  Social History Narrative   Lives with spouse   Right Handed   Drinks 1-2 cups caffeine daily   Social Determinants of Health   Financial Resource Strain: Not on file  Food Insecurity: Not on file  Transportation Needs: Not on file  Physical Activity: Not on file  Stress: Not on file  Social Connections: Not on file  Intimate Partner Violence: Not on file     PHYSICAL EXAM  There were no vitals filed for this visit.   There is no height or weight on file to calculate BMI.   General: The patient is well-developed and well-nourished and in no acute  distress   Musculoskeletal:   Her neck has a good ROM. She has mild splenius capitus   Neurologic Exam  Mental status: The patient is alert and oriented x 3 at the time of the examination.  Speech is normal.  Cranial nerves: Extraocular movements are full.  Facial strength and sensation was normal.  Trapezius strength was normal.  No obvious hearing deficits are noted.  Motor:   Muscle tone and bulk is normal. She has normal strength.    Gait and station: Station and gait are normal.        ASSESSMENT AND PLAN    1. Chronic migraine w/o aura, not intractable, w/o stat migr   2. Facet hypertrophy of lumbar region   3. Obstructive sleep apnea   4. Facet syndrome, lumbar   5. Abnormal brain MRI     1.   Inject 172 units Botox (28 units wasted) :   Frontalis (2.5 U x 4), nasalis/corrugators (2.5 U x 3), temporalis  (5 U x 9 - 5R, 4L), occipitalis (5 U x 6), splenius capitis (15U x 2), trapezius (15 U x 2), C6C7 paraspinal muscles (10 U x 2), wasted 28U 2.   She will take indomethacin when migraine occurs.  Can also take Maxalt or Percocet as needed. 3.   Continue to use CPAP nightly for OSA.  4.   Continue gabapentin to 600 mg at night and can continue tizanidine 5.   Return to see me in about 3 months for the next Botox injection, or sooner if she has new or worsening neurologic symptoms.    Johnanna Bakke A. Felecia Shelling, MD, PhD [id number], [id number] PM Certified in Neurology, Clinical Neurophysiology, Sleep Medicine, Pain Medicine and Neuroimaging  [facility] [address], [place], [place] [address] [phone]

## 2021-06-25 ENCOUNTER — Other Ambulatory Visit: Payer: Self-pay | Admitting: *Deleted

## 2021-06-25 MED ORDER — ONDANSETRON 8 MG PO TBDP: 8.0000 mg | ORAL_TABLET | Freq: Three times a day (TID) | ORAL | 2 refills | Status: DC | PRN

## 2021-07-07 ENCOUNTER — Other Ambulatory Visit: Payer: Self-pay | Admitting: *Deleted

## 2021-07-07 DIAGNOSIS — M47816 Spondylosis without myelopathy or radiculopathy, lumbar region: Secondary | ICD-10-CM

## 2021-08-12 ENCOUNTER — Ambulatory Visit
Admission: RE | Admit: 2021-08-12 | Discharge: 2021-08-12 | Disposition: A | Discharge status: Home / Self Care | Payer: BC Managed Care – PPO | Source: Ambulatory Visit | Attending: Neurology | Admitting: Neurology

## 2021-08-12 ENCOUNTER — Other Ambulatory Visit: Payer: Self-pay | Admitting: Neurology

## 2021-08-12 DIAGNOSIS — M47816 Spondylosis without myelopathy or radiculopathy, lumbar region: Secondary | ICD-10-CM

## 2021-08-12 MED ORDER — FENTANYL CITRATE PF 50 MCG/ML IJ SOSY
25.0000 ug | PREFILLED_SYRINGE | INTRAMUSCULAR | Status: DC | PRN
  Administered 2021-08-12 (×2): 50 ug via INTRAVENOUS

## 2021-08-12 MED ORDER — MIDAZOLAM HCL 2 MG/2ML IJ SOLN
1.0000 mg | INTRAMUSCULAR | Status: DC | PRN
Start: 2021-08-12 — End: 2021-08-13
  Administered 2021-08-12 (×4): 1 mg via INTRAVENOUS

## 2021-08-12 MED ORDER — SODIUM CHLORIDE 0.9 % IV SOLN: INTRAVENOUS | Status: DC

## 2021-08-12 MED ORDER — ONDANSETRON HCL 4 MG/2ML IJ SOLN
4.0000 mg | Freq: Once | INTRAMUSCULAR | Status: AC
  Administered 2021-08-12: 4 mg via INTRAVENOUS

## 2021-08-12 MED ORDER — METHYLPREDNISOLONE ACETATE 40 MG/ML INJ SUSP (RADIOLOG
80.0000 mg | Freq: Once | INTRAMUSCULAR | Status: AC
  Administered 2021-08-12: 80 mg via INTRALESIONAL

## 2021-08-12 MED ORDER — KETOROLAC TROMETHAMINE 30 MG/ML IJ SOLN
30.0000 mg | Freq: Once | INTRAMUSCULAR | Status: AC
  Administered 2021-08-12: 30 mg via INTRAVENOUS

## 2021-08-12 NOTE — Progress Notes (Signed)
Pt back in nursing recovery area after RFA procedure. Pt awake and alert. PT sipping on sprite and eating crackers at the moment. Pt follows commands, talks in complete sentences and has no complaints at this time. Pt will be monitored until discharged by Radiologist.

## 2021-08-12 NOTE — Discharge Instructions (Signed)
Radio Frequency Ablation Post Procedure Discharge Instructions ? ?May resume a regular diet and any medications that you routinely take (including pain medications). ?No driving day of procedure. ?Upon discharge go home and rest for at least 4 hours.  May use an ice pack as needed to injection sites on back. ?Remove bandades later, today. ? ? ? ?Please contact our office at [phone] for the following symptoms: ? ?Fever greater than 100 degrees ?Increased swelling, pain, or redness at injection site. ? ? ?Thank you for visiting [facility].  ?

## 2021-08-25 DIAGNOSIS — C187 Malignant neoplasm of sigmoid colon: Secondary | ICD-10-CM | POA: Insufficient documentation

## 2021-09-10 ENCOUNTER — Telehealth: Payer: Self-pay | Admitting: Neurology

## 2021-09-10 NOTE — Telephone Encounter (Signed)
Patient has a Botox appointment 11/3. Botox was received from [facility] on 10/11. PA for Botox with [employer] expired 09/07/21. I have filled out new PA form and faxed to [facility] with notes.

## 2021-09-15 NOTE — Telephone Encounter (Signed)
Received approval from [facility]. PA reference #[id number] (09/10/21- 08/11/22).

## 2021-09-16 DIAGNOSIS — C189 Malignant neoplasm of colon, unspecified: Principal | ICD-10-CM

## 2021-09-17 ENCOUNTER — Ambulatory Visit: Admit: 2021-09-17 | Discharge: 2021-09-18 | Payer: PRIVATE HEALTH INSURANCE

## 2021-09-17 DIAGNOSIS — C189 Malignant neoplasm of colon, unspecified: Principal | ICD-10-CM

## 2021-09-19 ENCOUNTER — Ambulatory Visit: Admit: 2021-09-19 | Discharge: 2021-09-20 | Payer: PRIVATE HEALTH INSURANCE

## 2021-09-19 DIAGNOSIS — C187 Malignant neoplasm of sigmoid colon: Principal | ICD-10-CM

## 2021-09-19 DIAGNOSIS — C189 Malignant neoplasm of colon, unspecified: Principal | ICD-10-CM

## 2021-09-19 DIAGNOSIS — Z79899 Other long term (current) drug therapy: Principal | ICD-10-CM

## 2021-09-25 ENCOUNTER — Ambulatory Visit (INDEPENDENT_AMBULATORY_CARE_PROVIDER_SITE_OTHER): Payer: BC Managed Care – PPO | Admitting: Neurology

## 2021-09-25 ENCOUNTER — Encounter: Payer: Self-pay | Admitting: Neurology

## 2021-09-25 VITALS — BP 136/89 | HR 76 | Ht 66.0 in | Wt 278.2 lb

## 2021-09-25 DIAGNOSIS — M47816 Spondylosis without myelopathy or radiculopathy, lumbar region: Secondary | ICD-10-CM | POA: Diagnosis not present

## 2021-09-25 DIAGNOSIS — M542 Cervicalgia: Secondary | ICD-10-CM | POA: Diagnosis not present

## 2021-09-25 DIAGNOSIS — G4733 Obstructive sleep apnea (adult) (pediatric): Secondary | ICD-10-CM | POA: Diagnosis not present

## 2021-09-25 DIAGNOSIS — G43709 Chronic migraine without aura, not intractable, without status migrainosus: Secondary | ICD-10-CM | POA: Diagnosis not present

## 2021-09-25 DIAGNOSIS — C189 Malignant neoplasm of colon, unspecified: Secondary | ICD-10-CM | POA: Insufficient documentation

## 2021-09-25 MED ORDER — OXYCODONE-ACETAMINOPHEN 10-325 MG PO TABS: 1.0000 | ORAL_TABLET | Freq: Three times a day (TID) | ORAL | 0 refills | Status: DC | PRN

## 2021-09-25 NOTE — Progress Notes (Signed)
[facility]  PATIENT: Pamela Murphy DOB: 05-22-1971 REASON FOR VISIT: Chronic Migraine headaches   HISTORICAL  CHIEF COMPLAINT:  Chief Complaint  Patient presents with   Follow-up    New room, alone. Here for botox/migraines. SP. 200Ux1 vial. Lot: [id number], exp: 02/2024. NDC: [phone]    HISTORY OF PRESENT ILLNESS:  Pamela Murphy is a 50 y.o. woman with a chronic migraine disorder, obstructive sleep apnea and abnormal MRI of the brain.   Update 09/25/2021 Migraine headaches have done very well since her last Botox injections 3 months ago.  She had only a couple despite more stress with her health (colon cancer).     When migraine occurs she will take indomethacin or a triptan and Percocet if pain does not improve.  Botox also has helped the neck pain some.  She tolerated fairly well and has not had any eyelid drooping since the dose was modified.  She continues to experience radicular pain down the left arm.    She has OSA and is on CPAP and tolerates it well.    Download last year showed 100% compliance with excellent efficacy (AHI of 0.8).  She sleeps better when she uses CPAP than when she does not.  She feels refreshed in the morning.  Her back pain is better since the RFA.    MRI of the lumbar spine has shown left greater than right facet hypertrophy at L4-L5 and L5-S1.  She has colon cancer and had a partial resection resection.   She will be starting oxiplatin and capectotabine.   No liver met's.     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    She did just start menopause.   She takes tizanidine.  Initially it helped her sleep but now even 2 pills has not helped much.      Trazodone had not helped 10-15 years ago.   She takes gabapentin 300 mgat night as well.    She has RLS, worse on the left.      Migraine history:  She has a long history of chronic migraines that were occurring almost every day (25-28 days/month) for 4 or more hours a  day. Multiple medications were tried for prophylactic and abortive therapies with suboptimal response.    In the past, she has failed multiple medications including:  Antiepileptics (Topamax, Keppra, zonisamide), anti-depressants (Cymbalta, Prozac), multiple NSAID's, betas blockers (Inderal, atenolol), muscle relaxants (soma, tizanidine, cyclobenzaprine) in various combinations.   Once the headache occurs, she has tried triptan's with incomplete benefit much of the time. She has also tried opiates.   IM Toradol has been most effective when one occurs.  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   I "undiagnosed her" around 2010 and she got a second opinion from Dr. Terie Purser who also concurred.  MRI shows small round foci predominantly in the subcortical and deep white matter in a nonspecific manner.  Her last MRI of the brain was in 2016 and was stable compared to the previous ones.  She had had paresthesias and fatigue in the past that contributed to her diagnosis.   We have discussed that she has been stable times many years off therapy that MS is significantly less likely.   REVIEW OF SYSTEMS:  Constitutional: No fevers, chills, sweats, or change in appetite Eyes: No visual changes, double vision, eye pain Ear, nose and throat: No hearing loss, ear pain,  nasal congestion, sore throat Cardiovascular: No chest pain, palpitations Respiratory:  see above.  She has OSA. GastrointestinaI: No nausea, vomiting, diarrhea, abdominal pain, fecal incontinence Genitourinary:  No dysuria, urinary retention or frequency.  No nocturia.   Dysfunctional uterine bleeding Musculoskeletal:  notes low back pain that has been worse the last couple months and neck pain that is helped by Botox Integumentary: No rash, pruritus, skin lesions Neurological: as above Psychiatric: No depression at this time.  No anxiety Endocrine: No palpitations, diaphoresis, change in  appetite, change in weigh or increased thirst Hematologic/Lymphatic:   She has anemia due to dysfunctional uterine bleeding. Allergic/Immunologic: No itchy/runny eyes, nasal congestion, recent allergic reactions, rashes  ALLERGIES: Allergies  Allergen Reactions   Cefaclor Swelling and Rash     neck swelling   Prochlorperazine Maleate Rash and Other (See Comments)     jittery;tachycardia   Bupropion Anxiety   Sulfamethoxazole-Trimethoprim Nausea Only   Tessalon Perles [Benzonatate] Rash    HOME MEDICATIONS: Outpatient Medications Prior to Visit  Medication Sig Dispense Refill   atenolol (TENORMIN) 25 MG tablet TAKE 1 TABLET BY MOUTH EVERY DAY 90 tablet 3   Botulinum Toxin Type A (BOTOX) 200 units SOLR INJECT INTO THE MUSCLES BY PHYSICIAN EVERY 3 MONTHS FOR MIGRAINE HEADACHES. DISCARD UNUSED PORTION. 1 each 3   CAPECITABINE PO Take by mouth. Will start 10/09/21     gabapentin (NEURONTIN) 300 MG capsule Take 1-2 capsules at bedtime 60 capsule 11   indomethacin (INDOCIN) 25 MG capsule TAKE 1 CAPSULE (25 MG TOTAL) BY MOUTH 2 (TWO) TIMES DAILY WITH A MEAL. 60 capsule 3   levothyroxine (SYNTHROID) 25 MCG tablet Take 25 mcg by mouth daily before breakfast.     Multiple Vitamins-Minerals (MULTIVITAMIN WITH MINERALS) tablet Take by mouth.     ondansetron (ZOFRAN-ODT) 8 MG disintegrating tablet Take 1 tablet (8 mg total) by mouth every 8 (eight) hours as needed for nausea or vomiting. 30 tablet 2   OXALIPLATIN IV Inject into the vein. Will start 10/09/21     PNEUMOCOCCAL VAC POLYVALENT IJ Inject as directed. Received in January 2021     rizatriptan (MAXALT-MLT) 10 MG disintegrating tablet TAKE 1 TABLET BY MOUTH AS NEEDED FOR MIGRAINE. MAY REPEAT IN 2 HOURS IF NEEDED 12 tablet 11   tiZANidine (ZANAFLEX) 4 MG tablet Take 1 tablet (4 mg total) by mouth 3 (three) times daily. 90 tablet 11   methylPREDNISolone (MEDROL DOSEPAK) 4 MG TBPK tablet Take 6 tablets on day 1 and decrease by 1 tablet each day  until finished 21 tablet 0   oxyCODONE-acetaminophen (PERCOCET) 10-325 MG tablet Take 1 tablet by mouth every 8 (eight) hours as needed for pain. 90 tablet 0   Semaglutide, 1 MG/DOSE, (OZEMPIC, 1 MG/DOSE,) 4 MG/3ML SOPN Inject 2 mg into the skin every 7 (seven) days.     JARDIANCE 10 MG TABS tablet Take 10 mg by mouth daily. (Patient not taking: Reported on 09/25/2021)     pantoprazole (PROTONIX) 40 MG tablet Take 40 mg by mouth 2 (two) times daily.      No facility-administered medications prior to visit.    PAST MEDICAL HISTORY: Past Medical History:  Diagnosis Date   Headache    Hypertension    Type 2 diabetes mellitus ([place]) 06/08/2018   Vision abnormalities     PAST SURGICAL HISTORY: Past Surgical History:  Procedure Laterality Date   SINUS IRRIGATION      FAMILY HISTORY: Family History  Problem Relation Age of Onset  Breast cancer Mother    Melanoma Mother    Atrial fibrillation Mother    Supraventricular tachycardia Mother    Heart attack Father    Hypertension Father    Hyperlipidemia Father     SOCIAL HISTORY:  Social History   Socioeconomic History   Marital status: Married    Spouse name: Not on file   Number of children: Not on file   Years of education: Not on file   Highest education level: Not on file  Occupational History    Comment: full time  Tobacco Use   Smoking status: Never   Smokeless tobacco: Never  Substance and Sexual Activity   Alcohol use: No    Alcohol/week: 0.0 standard drinks    Comment: Rare   Drug use: No   Sexual activity: Not on file  Other Topics Concern   Not on file  Social History Narrative   Lives with spouse   Right Handed   Drinks 1-2 cups caffeine daily   Social Determinants of Health   Financial Resource Strain: Not on file  Food Insecurity: Not on file  Transportation Needs: Not on file  Physical Activity: Not on file  Stress: Not on file  Social Connections: Not on file  Intimate Partner Violence: Not  on file     PHYSICAL EXAM  Vitals:   09/25/21 1013  BP: 136/89  Pulse: 76  Weight: 278 lb 3.2 oz (126.2 kg)  Height: 5' 6"  (1.676 m)     Body mass index is 44.9 kg/m.   General: The patient is well-developed and well-nourished and in no acute distress   Musculoskeletal:   Her neck has a good ROM. She has no neck pain.     Neurologic Exam  Mental status: The patient is alert and oriented x 3 at the time of the examination.  Speech is normal.  Cranial nerves: Extraocular movements are full.  Facial strength and sensation was normal.  Trapezius strength was normal.  No obvious hearing deficits are noted.  Motor:   Muscle tone and bulk is normal. She has normal strength.    Gait and station: Station and gait are normal.        ASSESSMENT AND PLAN    1. Chronic migraine w/o aura, not intractable, w/o stat migr   2. Facet hypertrophy of lumbar region   3. Obstructive sleep apnea   4. Neck pain   5. Malignant neoplasm of colon, unspecified part of colon ([place])      1.   Inject 177 units Botox (23 units wasted) :   Frontalis (2.5 U x 4), nasalis/corrugators (2.5 U x 3), temporalis  (5 U x 10 - 5R, 4L), occipitalis (5 U x 6), splenius capitis (15U x 2), trapezius (15 U x 2), C6C7 paraspinal muscles (10 U x 2), wasted 28U 2.   She will take Maxalt or Percocet as needed for migraines and percocet as needed for LBP.     Will renew.  Because she has some gastritis she will hold off on indomethacin when the headache occurs. 3.   Continue to use CPAP nightly for OSA.  4.   If back pain worsens again we can set up radiofrequency ablation.  Continue gabapentin to 600 mg at night and can continue tizanidine 5.   Return to see me in about 3 months for the next Botox injection, or sooner if she has new or worsening neurologic symptoms.    Saadiq Poche A. Felecia Shelling, MD, PhD 09/25/2021,  34:35 AM Certified in Neurology, Clinical Neurophysiology, Sleep Medicine, Pain Medicine and  Neuroimaging  [facility] [address] [place], [place] [address] [phone]

## 2021-09-26 NOTE — Unmapped (Unsigned)
Error - see capecitabine onboarding notes from 10/02/21 precautions (bruising easily, nose bleeds, gums bleed)  ??? Headache  ??? Dry skin  ??? Hair loss    The following side effects should be reported to the provider:  ??? Diarrhea not controlled by anti-diarrheals  ??? Swelling, warmth, numbness, change of color or pain in a leg or arm  ??? Signs of infection (fever >100.4, chills, sore throat, sputum production)  ??? Signs of liver problems (dark urine, abdominal pain, light-colored stools, vomiting, yellow skin or eyes)  ??? Signs of bleeding (vomiting or coughing up blood, blood that looks like coffee grounds, blood in the urine or black, red tarry stools, bruising that gets bigger without reason, any persistent or severe bleeding)  ??? Skin rash or signs of skin infection (cracking, peeling, blistering, bleeding skin, red or irritated eyes)  ??? Signs of fluid and electrolyte problems (mood changes, confusion, abnormal/fast  heartbeat, severe dizziness, passing out, increased thirst, seizures, loss of strength and energy, lack of appetite, unable to pass urine or change in amount of urine passed, dry mouth, dry eyes, or nausea or vomiting.  ??? Signs of hand foot syndrome (redness or irritation on the palms of the hands or soles of the feet)  ??? Signs of mucositis (red or swollen mouth/gums, sores in the mouth, gums or tongue, soreness or pain in the mouth or throat, difficulty swallowing or talking, dryness, mild burning, or pain when eating food white patches or pus in the mouth or on the tongue, increased mucus or thicker saliva)  ??? Signs of anaphylaxis (wheezing, chest tightness, swelling of face, lips, tongue or throat)    Monitoring parameters: Renal function should be estimated at baseline to determine initial dose. During therapy, CBC with differential, hepatic function, and renal function should be monitored. Monitor INR closely if receiving concomitant warfarin. Pregnancy test prior to treatment initiation (in females of reproductive potential). Monitor for diarrhea, dehydration, hand-foot syndrome, Stevens-Johnson syndrome, toxic epidermal necrolysis, stomatitis, and cardiotoxicity. Monitor adherence.    Contraindications, Warnings & Precautions     [place] Boxed Warning: Capecitabine may increase the anticoagulant effects of warfarin; bleeding events, including death, have occurred with concomitant use. Clinically significant increases in prothrombin time (PT) and INR have occurred within several days to months after capecitabine initiation (in patients previously stabilized on anticoagulants), and may continue up to 1 month after capecitabine discontinuation. May occur in patients with or without liver metastases. Monitor PT and INR frequently and adjust anticoagulation dosing accordingly. An increased risk of coagulopathy is correlated with a cancer diagnosis and age >60 years.    Contraindications:  ??? Known hypersensitivity to capecitabine, fluorouracil, or any component of the formulation;   ??? Severe renal impairment (CrCl <30 mL/minute)    Warnings and Precautions:  ??? Bone marrow suppression  ??? Cardiotoxicity: Myocardial infarction, ischemia, angina, dysrhythmias, cardiac arrest, cardiac failure, sudden death, ECG changes, and cardiomyopathy  ??? Mouth sores-discussed use of baking soda/salt water rinses  ??? Dermatologic toxicity: Stevens-Johnson syndrome and toxic epidermal necrolysis   ??? Hand/foot prevention (moisturizers, luke warm hand washes, patting hands/feet dry, decrease rubbing on hands/feet)  ??? GI toxicity: May cause diarrhea (may be severe); Importance of good nutrition to help minimize diarrhea (high protein, BRAT, yogurt, avoid greasy and spicy foods).  Importance of hydration if having frequent diarrhea  ??? Reproductive concerns: Evaluate pregnancy status prior to therapy in females of reproductive potential. Females of reproductive potential should use effective contraception during treatment and for 6 months after  the last dose. Males with female partners of reproductive potential should use effective contraception during treatment and for 3 months after the last dose. Based on the mechanism of action and data from animal reproduction studies, in utero exposure to capecitabine may cause fetal harm.   ??? Breast feeding considerations: It is not known if capecitabine is present in breast milk. Due to the potential for serious adverse reactions in the breastfed infant, breastfeeding is not recommended by the manufacturer during treatment and for 2 weeks after the last dose.    Drug/Food Interactions     ??? Medication list reviewed in Epic. The patient was instructed to inform the care team before taking any new medications or supplements. 1. Pantoprazole - Inhibitors of the Proton Pump (PPIs and PCABs) may diminish the therapeutic effect of Capecitabine - monitor therapy.  2. Capecitabine - Fluorouracil Products may enhance the QTc-prolonging effect of QT-prolonging Antidepressants like escitalopram - monitor therapy.  3. Ondansetron may enhance the QTc-prolonging effect of Fluorouracil Products like capecitabine but greater significance with IV ondansetron.  - monitor therapy .   ??? Avoid live vaccines.    Storage, Handling Precautions, & Disposal     ??? This medication should be stored at room temperature and in a dry location. Keep out of reach of others including children and pets. Keep the medicine in the original container with a child-proof top (no pillboxes). Do not throw away or flush unused medication down the toilet or sink. This drug is considered hazardous and should be handled as little as possible.  If someone else helps with medication administration, they should wear gloves.    Current Medications (including OTC/herbals), Comorbidities and Allergies     Current Outpatient Medications   Medication Sig Dispense Refill   ??? ACCU-CHEK GUIDE ME GLUCOSE MTR Misc Use as directed.     ??? ACCU-CHEK GUIDE TEST STRIPS Strp TEST ONCE DAILY     ??? ACCU-CHEK SOFTCLIX LANCETS lancets TEST ONCE DAILY     ??? atenolol (TENORMIN) 25 MG tablet Take 1 mg by mouth nightly.     ??? [START ON 10/09/2021] capecitabine (XELODA) 150 MG tablet Take 2 tablets (300 mg total) by mouth Two (2) times a day  with 1 other capecitabine prescription for 2,300 mg total. Take Days 1 through 14, then 1 week off. 56 tablet 3   ??? [START ON 10/09/2021] capecitabine (XELODA) 500 MG tablet Take 4 tablets (2,000 mg total) by mouth Two (2) times a day  with 1 other capecitabine prescription for 2,300 mg total. Take Days 1 through 14, then 1 week off. 112 tablet 3   ??? clonazePAM (KLONOPIN) 0.5 MG tablet 0.5 mg daily as needed.     ??? ESCITALOPRAM OXALATE ORAL 20 mg daily.     ??? FARXIGA 5 mg Tab tablet Take 5 mg by mouth daily.     ??? gabapentin (NEURONTIN) 300 MG capsule 300 mg  in the morning.     ??? HYDROcodone-acetaminophen (NORCO) 5-325 mg per tablet 1 tablet once as needed.     ??? indomethacin (INDOCIN) 25 MG capsule 25 mg once as needed.     ??? levothyroxine 25 mcg/mL Soln 25 mcg.     ??? lisinopriL (PRINIVIL,ZESTRIL) 2.5 MG tablet 2.5 mg  in the morning.     ??? multivitamin with minerals tablet Take 1 tablet by mouth daily.      ??? onabotulinumtoxinA (BOTOX) 200 unit SolR injection Inject 200 Units into the skin Every three (3) months.     ???  ondansetron (ZOFRAN-ODT) 8 MG disintegrating tablet Take 1 tablet by mouth once as needed.      ??? oxyCODONE-acetaminophen (PERCOCET) 10-325 mg per tablet Take 1 tablet by mouth every six (6) hours as needed.   0   ??? pantoprazole (PROTONIX) 40 MG tablet 40 mg  in the morning.     ??? rizatriptan (MAXALT-MLT) 10 MG disintegrating tablet Take 10 mg by mouth daily as needed.      ??? rosuvastatin (CRESTOR) 5 MG tablet 5 mg  in the morning.     ??? tiZANidine (ZANAFLEX) 4 MG tablet Take 4 mg by mouth every eight (8) hours as needed.        No current facility-administered medications for this visit.       Allergies   Allergen Reactions   ??? Bupropion Anxiety and Other (See Comments) ANXIETY  ANXIETY  ANXIETY  ANXIETY  ANXIETY     ??? Prochlorperazine Maleate Rash     Other Reaction: jittery;tachycardia   ??? Sulfa (Sulfonamide Antibiotics) Hives   ??? Sulfamethoxazole-Trimethoprim Nausea Only and Nausea And Vomiting   ??? Wellbutrin [Bupropion Hcl] Other (See Comments)     ANXIETY   ??? Benzonatate Rash   ??? Cefaclor Rash     Other Reaction: neck swelling, but tolerate rocephin IM fine per pt.       Patient Active Problem List   Diagnosis   ??? Mild intermittent asthma without complication   ??? Gastroesophageal reflux disease without esophagitis   ??? Cough   ??? Possible pregnancy   ??? Situational mixed anxiety and depressive disorder   ??? Abnormal nuclear stress test   ??? Cardiomegaly   ??? Intractable migraine without aura and without status migrainosus   ??? Need for Tdap vaccination   ??? Disease of thyroid gland   ??? Screening for breast cancer   ??? Acute non-recurrent maxillary sinusitis   ??? Obstructive sleep apnea syndrome   ??? Encounter for other screening for malignant neoplasm of breast   ??? Adjustment disorder with mixed anxiety and depressed mood   ??? Shortness of breath   ??? Cardiac enlargement   ??? Depression   ??? Coccydynia   ??? Acute bilateral low back pain without sciatica   ??? Acute cystitis without hematuria   ??? Subacromial bursitis of left shoulder joint   ??? Morbid obesity (CMS-HCC)   ??? Degeneration of cervical intervertebral disc   ??? HTN (hypertension)   ??? Anxiety   ??? Bronchitis with influenza   ??? Urine abnormality   ??? Hospital discharge follow-up   ??? TMJ arthralgia   ??? Elevated glucose   ??? Adenocarcinoma of sigmoid colon (CMS-HCC)       Reviewed and up to date in Epic.    Appropriateness of Therapy     Acute infections noted within Epic:  No active infections  Patient reported infection: None    Is medication and dose appropriate based on diagnosis and infection status? Yes    Prescription has been clinically reviewed: Yes      Baseline Quality of Life Assessment      How many days over the past month did your Adenocarcinoma of sigmoid colon  keep you from your normal activities? For example, brushing your teeth or getting up in the morning. {Blank:19197::0,***,Patient declined to answer}    Financial Information     Medication Assistance provided: Prior Authorization    Anticipated copay of $0 / 21 days each reviewed with patient. Verified delivery address.  Delivery Information     Scheduled delivery date: ***    Expected start date: 11.18.22    Medication will be delivered via UPS to the prescription address in Epic [place].  This shipment will not require a signature.      Explained the services we provide at [facility] and that each month we would call to set up refills.  Stressed importance of returning phone calls so that we could ensure they receive their medications in time each month.  Informed patient that we should be setting up refills 7-10 days prior to when they will run out of medication.  A pharmacist will reach out to perform a clinical assessment periodically.  Informed patient that a welcome packet, containing information about our pharmacy and other support services, a Notice of Privacy Practices, and a drug information handout will be sent.      The patient or caregiver noted above participated in the development of this care plan and knows that they can request review of or adjustments to the care plan at any time.      Patient or caregiver verbalized understanding of the above information as well as how to contact the pharmacy at [phone] option 4 with any questions/concerns.  The pharmacy is open Monday through Friday 8:30am-4:30pm.  A pharmacist is available 24/7 via pager to answer any clinical questions they may have.    Patient Specific Needs     - Does the patient have any physical, cognitive, or cultural barriers? {Blank single:19197::No,Yes - ***}    - Does the patient have adequate living arrangements? (i.e. the ability to store and take their medication appropriately) {Blank single:19197::Yes,No - ***}    - Did you identify any home environmental safety or security hazards? {Blank single:19197::No,Yes - ***}    - Patient prefers to have medications discussed with  {Blank single:19197::Patient,Family Member,Caregiver,Other}     - Is the patient or caregiver able to read and understand education materials at a high school level or above? {Blank single:19197::No,Yes}    - Patient's primary language is  English     - Is the patient high risk? Yes, patient is taking oral chemotherapy. Appropriateness of therapy as been assessed    - Does the patient require physician intervention or other additional services (i.e. dietary/nutrition, smoking cessation, social work)? No      Johnthomas Lader A [facility] Specialty Pharmacist

## 2021-09-26 NOTE — Unmapped (Signed)
[facility] Specialty Medication Onboarding    Specialty Medication: Capecitabine 150mg  AND Capecitabine 500mg   Prior Authorization: Approved   Financial Assistance: No - copay  <$25  Final Copay/Day Supply: $0 / 21 (both)    Insurance Restrictions: None     Notes to Pharmacist:     The triage team has completed the benefits investigation and has determined that the patient is able to fill this medication at [facility]. Please contact the patient to complete the onboarding or follow up with the prescribing physician as needed.

## 2021-09-29 ENCOUNTER — Ambulatory Visit: Admit: 2021-09-29 | Discharge: 2021-09-29 | Payer: PRIVATE HEALTH INSURANCE

## 2021-09-29 DIAGNOSIS — C187 Malignant neoplasm of sigmoid colon: Principal | ICD-10-CM

## 2021-09-29 MED ADMIN — sodium chloride (NS) 0.9 % infusion: INTRAVENOUS | @ 19:00:00 | Stop: 2021-09-29

## 2021-09-29 MED ADMIN — lidocaine-EPINEPHrine (XYLOCAINE W/EPI) 1 %-1:100,000 injection: SUBCUTANEOUS | @ 19:00:00 | Stop: 2021-09-29

## 2021-09-29 MED ADMIN — midazolam (VERSED) injection: INTRAVENOUS | @ 19:00:00 | Stop: 2021-09-29

## 2021-09-29 MED ADMIN — iohexoL (OMNIPAQUE) 350 mg iodine/mL solution 75 mL: 75 mL | INTRAVENOUS | @ 22:00:00 | Stop: 2021-09-29

## 2021-09-29 MED ADMIN — fentaNYL (PF) (SUBLIMAZE) injection: INTRAVENOUS | @ 19:00:00 | Stop: 2021-09-29

## 2021-09-29 MED ADMIN — heparin lock flush (porcine) 100 unit/mL injection: INTRAVENOUS | @ 19:00:00 | Stop: 2021-09-29

## 2021-09-29 NOTE — Unmapped (Signed)
[facility] Interventional Radiology  Post-procedure Note    Patient: Nina Mitchell    DOB: 11/30/1970  Medical Record Number: [id number]   Note Date/Time: September 29, 2021 2:15 PM     Procedure: Right port placement    Diagnosis: adenocarcinoma of the sigmoid colon    Attending: Dr. Trude Mcburney, MD     Fellow/Resident: Wandalee Ferdinand, md    Time out: Prior to the procedure, a time out was performed with all team members present. During the time out, the patient, procedure and procedure site when applicable were verbally verified by the team members and Dr.Priya Mody, MD    Complications: NONE    Access: Right IJ    Sedation: Conscious Sedation     EBL: Minimal    Samples:  None    Brief Description: Placement of RIJ chest port to prox R atrium.    Plan: Discharge with follow up with outpatient ordering provider.    Full report to follow in PACS.    Wandalee Ferdinand, MD  September 29, 2021 2:15 PM

## 2021-09-29 NOTE — Unmapped (Signed)
[facility] INTERVENTIONAL RADIOLOGY - Pre Procedure H/P      Assessment/Plan:    [name] is a 50 y.o. female who will undergo SL port placement in Interventional Radiology.    --This procedure has been fully reviewed with the patient/patient???s authorized representative. The risks, benefits and alternatives have been explained, and the patient/patient???s authorized representative has consented to the procedure.  --The patient will accept blood products in an emergent situation.  --The patient does not have a Do Not Resuscitate order in effect.      HPI: [name] is a 50 y.o. female with past medical history significant for adenocarcinoma of the sigmoid colon.  Other significant past medical history includes hypertension, type 2 diabetes, hypothyroidism, and multiple sclerosis.  Patient presents for single-lumen port placement for chemotherapy.    Allergies:   Allergies   Allergen Reactions   ??? Bupropion Anxiety and Other (See Comments)     ANXIETY  ANXIETY  ANXIETY  ANXIETY  ANXIETY     ??? Prochlorperazine Maleate Rash     Other Reaction: jittery;tachycardia   ??? Sulfa (Sulfonamide Antibiotics) Hives   ??? Sulfamethoxazole-Trimethoprim Nausea Only and Nausea And Vomiting   ??? Wellbutrin [Bupropion Hcl] Other (See Comments)     ANXIETY   ??? Benzonatate Rash   ??? Cefaclor Rash     Other Reaction: neck swelling, but tolerate rocephin IM fine per pt.       Medications:  No relevant medications, please see full medication list in Epic.    ASA Grade: ASA 3 - Patient with moderate systemic disease with functional limitations    PE:    There were no vitals filed for this visit.  General: female in NAD.  Airway assessment: Class 3 - Can visualize soft palate  Lungs: Respirations nonlabored

## 2021-09-30 ENCOUNTER — Ambulatory Visit: Admit: 2021-09-30 | Discharge: 2021-10-01 | Payer: PRIVATE HEALTH INSURANCE

## 2021-09-30 NOTE — Unmapped (Signed)
Post procedure phone call complete. Pt states she is doing well. Advised to call back with any questions or concerns.

## 2021-09-30 NOTE — Unmapped (Signed)
Called Ms.  to go over results of her CT chest.    CT chest initially called several new sclerotic lesions. However, on further review of her scan from 2018, seems that these lesions were present at that time. I discussed case with radiology who agrees and said C7 is more prominent than that time. My concern that these represent metastatic disease is quite low.    She had a RLL pulmonary nodule back in 2018 and does have new nodules in her lungs since that time. They are indeterminate at this time and too small to biopsy. I recommend that we proceed with her three months of CAPOX and reassess with a CT chest after adjuvant therapy.    If the lesions are stable in size, will continue to follow. If they have gone away, could represent infection/inflammation or treated metastatic disease. If concern remains that these represent metastatic disease in three months, will obtain PET-CT.     She was reassured and understood the plan. Will see her next Friday to start CAPOX.

## 2021-10-02 NOTE — Unmapped (Signed)
[facility]   Patient Onboarding/Medication Counseling    Ms. is a 50 y.o. female with Adenocarcinoma of sigmoid colon who I am counseling today on initiation of therapy.  I am speaking to the patient.    Was a Nurse, learning disability used for this call? No    Verified patient's date of birth / HIPAA.    Specialty medication(s) to be sent: Hematology/Oncology: Capecitabine 500mg , directions: Take 4 tablets (2,000 mg total) by mouth Two (2) times a day  with 1 other capecitabine prescription for 2,300 mg total. Take Days 1 through 14, then 1 week off. and Capecitabine 150mg , directions: Take 2 tablets (300 mg total) by mouth Two (2) times a day  with 1 other capecitabine prescription for 2,300 mg total. Take Days 1 through 14, then 1 week off.    Non-specialty medications/supplies to be sent: none    Medications not needed at this time: none     Xeloda (capecitabine)    Medication & Administration     Dosage: Take four 500 mg tablets + two 150 mg tablets (2,300 mg total dose) twice daily for 14 days then 7 days off for a 21 day cycle.    Administration: I reviewed the importance of taking with a full glass of water within 30 minutes of a meal (at least 1 cup of food). Discussed that tablet should be swallowed whole and cannot be crushed or chewed.    Adherence/Missed dose instruction: If a dose is missed, do not take an extra dose or two doses at one time. Simply take your next dose at the regularly scheduled time and record any missed doses so our team is aware.    Goals of Therapy     Prevent disease progression    Side Effects & Monitoring Parameters     ??? Nausea/vomiting  ??? Diarrhea/constipation  ??? Infection precautions  ??? Fatigue  ??? Mouth sores or irritation  ??? Hand/foot syndrome (tingling, numbness, pain, redness, peeling or blistering) Use Urea 20% cream, Aquaphor, Eucerin, Cetaphil, or CeraVe twice daily on hands and feet as preventative  ??? Sun precautions  ??? Decrease appetite/ taste changes  ??? Bleeding precautions (bruising easily, nose bleeds, gums bleed)  ??? Headache  ??? Dry skin  ??? Hair loss    The following side effects should be reported to the provider:  ??? Diarrhea not controlled by anti-diarrheals  ??? Swelling, warmth, numbness, change of color or pain in a leg or arm  ??? Signs of infection (fever >100.4, chills, sore throat, sputum production)  ??? Signs of liver problems (dark urine, abdominal pain, light-colored stools, vomiting, yellow skin or eyes)  ??? Signs of bleeding (vomiting or coughing up blood, blood that looks like coffee grounds, blood in the urine or black, red tarry stools, bruising that gets bigger without reason, any persistent or severe bleeding)  ??? Skin rash or signs of skin infection (cracking, peeling, blistering, bleeding skin, red or irritated eyes)  ??? Signs of fluid and electrolyte problems (mood changes, confusion, abnormal/fast  heartbeat, severe dizziness, passing out, increased thirst, seizures, loss of strength and energy, lack of appetite, unable to pass urine or change in amount of urine passed, dry mouth, dry eyes, or nausea or vomiting.  ??? Signs of hand foot syndrome (redness or irritation on the palms of the hands or soles of the feet)  ??? Signs of mucositis (red or swollen mouth/gums, sores in the mouth, gums or tongue, soreness or pain in the mouth  or throat, difficulty swallowing or talking, dryness, mild burning, or pain when eating food white patches or pus in the mouth or on the tongue, increased mucus or thicker saliva)  ??? Signs of anaphylaxis (wheezing, chest tightness, swelling of face, lips, tongue or throat)    Monitoring parameters: Renal function should be estimated at baseline to determine initial dose. During therapy, CBC with differential, hepatic function, and renal function should be monitored. Monitor INR closely if receiving concomitant warfarin. Pregnancy test prior to treatment initiation (in females of reproductive potential). Monitor for diarrhea, dehydration, hand-foot syndrome, Stevens-Johnson syndrome, toxic epidermal necrolysis, stomatitis, and cardiotoxicity. Monitor adherence.    Contraindications, Warnings & Precautions     [place] Boxed Warning: Capecitabine may increase the anticoagulant effects of warfarin; bleeding events, including death, have occurred with concomitant use. Clinically significant increases in prothrombin time (PT) and INR have occurred within several days to months after capecitabine initiation (in patients previously stabilized on anticoagulants), and may continue up to 1 month after capecitabine discontinuation. May occur in patients with or without liver metastases. Monitor PT and INR frequently and adjust anticoagulation dosing accordingly. An increased risk of coagulopathy is correlated with a cancer diagnosis and age >60 years.    Contraindications:  ??? Known hypersensitivity to capecitabine, fluorouracil, or any component of the formulation;   ??? Severe renal impairment (CrCl <30 mL/minute)    Warnings and Precautions:  ??? Bone marrow suppression  ??? Cardiotoxicity: Myocardial infarction, ischemia, angina, dysrhythmias, cardiac arrest, cardiac failure, sudden death, ECG changes, and cardiomyopathy  ??? Mouth sores-discussed use of baking soda/salt water rinses  ??? Dermatologic toxicity: Stevens-Johnson syndrome and toxic epidermal necrolysis   ??? Hand/foot prevention (moisturizers, luke warm hand washes, patting hands/feet dry, decrease rubbing on hands/feet)  ??? GI toxicity: May cause diarrhea (may be severe); Importance of good nutrition to help minimize diarrhea (high protein, BRAT, yogurt, avoid greasy and spicy foods).  Importance of hydration if having frequent diarrhea  ??? Reproductive concerns: Evaluate pregnancy status prior to therapy in females of reproductive potential. Females of reproductive potential should use effective contraception during treatment and for 6 months after the last dose. Males with female partners of reproductive potential should use effective contraception during treatment and for 3 months after the last dose. Based on the mechanism of action and data from animal reproduction studies, in utero exposure to capecitabine may cause fetal harm.   ??? Breast feeding considerations: It is not known if capecitabine is present in breast milk. Due to the potential for serious adverse reactions in the breastfed infant, breastfeeding is not recommended by the manufacturer during treatment and for 2 weeks after the last dose.    Drug/Food Interactions     ??? Medication list reviewed in Epic. The patient was instructed to inform the care team before taking any new medications or supplements. 1. Pantoprazole - Inhibitors of the Proton Pump (PPIs and PCABs) may diminish the therapeutic effect of Capecitabine - monitor therapy.  2. Capecitabine - Fluorouracil Products may enhance the QTc-prolonging effect of QT-prolonging Antidepressants like escitalopram - monitor therapy.  3. Ondansetron may enhance the QTc-prolonging effect of Fluorouracil Products like capecitabine but greater significance with IV ondansetron.  - monitor therapy.   ??? Avoid live vaccines.    Storage, Handling Precautions, & Disposal     ??? This medication should be stored at room temperature and in a dry location. Keep out of reach of others including children and pets. Keep the medicine in the original container with  a child-proof top (no pillboxes). Do not throw away or flush unused medication down the toilet or sink. This drug is considered hazardous and should be handled as little as possible.  If someone else helps with medication administration, they should wear gloves.    Current Medications (including OTC/herbals), Comorbidities and Allergies     Current Outpatient Medications   Medication Sig Dispense Refill   ??? ACCU-CHEK GUIDE ME GLUCOSE MTR Misc Use as directed.     ??? ACCU-CHEK GUIDE TEST STRIPS Strp TEST ONCE DAILY     ??? ACCU-CHEK SOFTCLIX LANCETS lancets TEST ONCE DAILY     ??? atenolol (TENORMIN) 25 MG tablet Take 1 mg by mouth nightly.     ??? [START ON 10/09/2021] capecitabine (XELODA) 150 MG tablet Take 2 tablets (300 mg total) by mouth Two (2) times a day  with 1 other capecitabine prescription for 2,300 mg total. Take Days 1 through 14, then 1 week off. 56 tablet 3   ??? [START ON 10/09/2021] capecitabine (XELODA) 500 MG tablet Take 4 tablets (2,000 mg total) by mouth Two (2) times a day  with 1 other capecitabine prescription for 2,300 mg total. Take Days 1 through 14, then 1 week off. 112 tablet 3   ??? clonazePAM (KLONOPIN) 0.5 MG tablet 0.5 mg daily as needed.     ??? ESCITALOPRAM OXALATE ORAL 20 mg daily.     ??? FARXIGA 5 mg Tab tablet Take 5 mg by mouth daily.     ??? gabapentin (NEURONTIN) 300 MG capsule 300 mg  in the morning.     ??? HYDROcodone-acetaminophen (NORCO) 5-325 mg per tablet 1 tablet once as needed.     ??? indomethacin (INDOCIN) 25 MG capsule 25 mg once as needed.     ??? levothyroxine 25 mcg/mL Soln 25 mcg.     ??? lisinopriL (PRINIVIL,ZESTRIL) 2.5 MG tablet 2.5 mg  in the morning.     ??? multivitamin with minerals tablet Take 1 tablet by mouth daily.      ??? onabotulinumtoxinA (BOTOX) 200 unit SolR injection Inject 200 Units into the skin Every three (3) months.     ??? ondansetron (ZOFRAN-ODT) 8 MG disintegrating tablet Take 1 tablet by mouth once as needed.      ??? oxyCODONE-acetaminophen (PERCOCET) 10-325 mg per tablet Take 1 tablet by mouth every six (6) hours as needed.   0   ??? pantoprazole (PROTONIX) 40 MG tablet 40 mg  in the morning.     ??? rizatriptan (MAXALT-MLT) 10 MG disintegrating tablet Take 10 mg by mouth daily as needed.      ??? rosuvastatin (CRESTOR) 5 MG tablet 5 mg  in the morning.     ??? tiZANidine (ZANAFLEX) 4 MG tablet Take 4 mg by mouth every eight (8) hours as needed.        No current facility-administered medications for this visit.       Allergies   Allergen Reactions   ??? Bupropion Anxiety and Other (See Comments) ANXIETY  ANXIETY  ANXIETY  ANXIETY  ANXIETY     ??? Prochlorperazine Maleate Rash     Other Reaction: jittery;tachycardia   ??? Sulfa (Sulfonamide Antibiotics) Hives   ??? Sulfamethoxazole-Trimethoprim Nausea Only and Nausea And Vomiting   ??? Wellbutrin [Bupropion Hcl] Other (See Comments)     ANXIETY   ??? Benzonatate Rash   ??? Cefaclor Rash     Other Reaction: neck swelling, but tolerate rocephin IM fine per pt.  Patient Active Problem List   Diagnosis   ??? Mild intermittent asthma without complication   ??? Gastroesophageal reflux disease without esophagitis   ??? Cough   ??? Possible pregnancy   ??? Situational mixed anxiety and depressive disorder   ??? Abnormal nuclear stress test   ??? Cardiomegaly   ??? Intractable migraine without aura and without status migrainosus   ??? Need for Tdap vaccination   ??? Disease of thyroid gland   ??? Screening for breast cancer   ??? Acute non-recurrent maxillary sinusitis   ??? Obstructive sleep apnea syndrome   ??? Encounter for other screening for malignant neoplasm of breast   ??? Adjustment disorder with mixed anxiety and depressed mood   ??? Shortness of breath   ??? Cardiac enlargement   ??? Depression   ??? Coccydynia   ??? Acute bilateral low back pain without sciatica   ??? Acute cystitis without hematuria   ??? Subacromial bursitis of left shoulder joint   ??? Morbid obesity (CMS-HCC)   ??? Degeneration of cervical intervertebral disc   ??? HTN (hypertension)   ??? Anxiety   ??? Bronchitis with influenza   ??? Urine abnormality   ??? Hospital discharge follow-up   ??? TMJ arthralgia   ??? Elevated glucose   ??? Adenocarcinoma of sigmoid colon (CMS-HCC)       Reviewed and up to date in Epic.    Appropriateness of Therapy     Acute infections noted within Epic:  No active infections  Patient reported infection: None    Is medication and dose appropriate based on diagnosis and infection status? Yes    Prescription has been clinically reviewed: Yes      Baseline Quality of Life Assessment      How many days over the past month did your Adenocarcinoma of sigmoid colon  keep you from your normal activities? For example, brushing your teeth or getting up in the morning. 0    Financial Information     Medication Assistance provided: Prior Authorization    Anticipated copay of $0 / 21 days (each) reviewed with patient. Verified delivery address.    Delivery Information     Scheduled delivery date: 10/07/21    Expected start date: 10/10/21    Medication will be delivered via UPS to the prescription address in [facility]  This shipment will not require a signature.      Explained the services we provide at [facility] and that each month we would call to set up refills.  Stressed importance of returning phone calls so that we could ensure they receive their medications in time each month.  Informed patient that we should be setting up refills 7-10 days prior to when they will run out of medication.  A pharmacist will reach out to perform a clinical assessment periodically.  Informed patient that a welcome packet, containing information about our pharmacy and other support services, a Notice of Privacy Practices, and a drug information handout will be sent.      The patient or caregiver noted above participated in the development of this care plan and knows that they can request review of or adjustments to the care plan at any time.      Patient or caregiver verbalized understanding of the above information as well as how to contact the pharmacy at [phone] option 4 with any questions/concerns.  The pharmacy is open Monday through Friday 8:30am-4:30pm.  A pharmacist is available 24/7 via pager to answer  any clinical questions they may have.    Patient Specific Needs     - Does the patient have any physical, cognitive, or cultural barriers? No    - Does the patient have adequate living arrangements? (i.e. the ability to store and take their medication appropriately) Yes    - Did you identify any home environmental safety or security hazards? No    - Patient prefers to have medications discussed with  Patient     - Is the patient or caregiver able to read and understand education materials at a high school level or above? Yes    - Patient's primary language is  English     - Is the patient high risk? Yes, patient is taking oral chemotherapy. Appropriateness of therapy as been assessed    - Does the patient require physician intervention or other additional services (i.e. dietary/nutrition, smoking cessation, social work)? No      Lannette Avellino [facility] Specialty Pharmacist

## 2021-10-04 NOTE — Unmapped (Signed)
Contacted patient by phone regarding chemotherapy education planning. Patient confirmed availability at 0900 on 10/06/21.    Care coordination: 5 minutes

## 2021-10-06 ENCOUNTER — Other Ambulatory Visit: Payer: Self-pay | Admitting: Neurology

## 2021-10-06 DIAGNOSIS — C187 Malignant neoplasm of sigmoid colon: Principal | ICD-10-CM

## 2021-10-06 MED FILL — CAPECITABINE 150 MG TABLET: ORAL | 21 days supply | Qty: 56 | Fill #0

## 2021-10-06 MED FILL — CAPECITABINE 500 MG TABLET: ORAL | 21 days supply | Qty: 112 | Fill #0

## 2021-10-06 NOTE — Unmapped (Signed)
Pharmacist First Cycle Chemotherapy Education     Ms. is a 50 y.o. female with sigmoid colon cancer who I have counseled prior to the initiation of capecitabine and oxaliplatin (CapeOx).     Side effects and warnings/precautions discussed included but were not limited to:  ?? Nausea/vomiting  ?? Complications of myelosuppression  ?? Cold sensitivity  ?? Peripheral neuropathy  ?? Diarrhea  ?? Mucositis  ?? Hand-foot syndrome  ?? Liver dysfunction  ?? Taste changes  ?? Skin/nail changes  ?? Fatigue  ?? Alopecia  ?? Fluid retention  ?? Reproductive concerns    Handout provided: [facility] Patient Education Handout    Pharmacist Capecitabine Education     Capecitabine dose and schedule discussed (2 tablet strengths to be used to make total dose):  ?? Capecitabine 500 mg tablets: Take 4 tablets (2000 mg) PO Q12H for 14 days followed by 7 days off beginning on day 1 of each CapeOx cycle.  ?? Capecitabine 150 mg tablets: Take 2 tablets (300 mg) PO Q12H for 14 days followed by 7 days off beginning on day 1 of each CapeOx cycle.  Take with water within 30 minutes after a meal for morning and evening doses. Patient aware to start with evening dose of capecitabine on day 1 of the cycle and end with morning dose on day 15.    Missed dose information discussed: If a dose is missed, do not take an extra dose or two doses at one time. Simply take your next dose at the regularly scheduled time and record any missed doses so our team is aware.    Side effects and warnings/precautions discussed (including but not limited to): see above    A corresponding management plan for these adverse effects was also shared with the patient. Instructions on when to contact our care team were discussed with the patient.    Drug interactions discussed: Medication list reviewed in Epic. The patient was instructed to inform the care team before taking any new medications or supplements.    Storage, handling, and disposal information discussed: Oral chemotherapy handling precautions reviewed. Store at room temperature, in a dry location, away from light. Keep out of reach of others including children and pets. Keep stored in originally provided packaging until the medication is ready to be taken. Do not flush unused medication down the toilet or sink or throw away in household trash; throw away safely by returning unused medication to [facility] or to a local take-back program for proper disposal.    Adherence discussed: Stressed the importance of taking medication as prescribed and to contact provider if that changes at any time.    ?? For prevention of hand and foot syndrome, apply an unscented moisturizing cream or ointment to the hands and feet twice daily. Examples include: Eucerin(R) Original Healing Cream, Aquaphor(R) Healing Ointment, or Cetaphil(R) Moisturizing Cream. Also avoid hot water exposure and friction on the hands and feet.  ?? For prevention of mouth sores, prepare a one-time use mixture of: 1/4 teaspoon of salt and 1/4 teaspoon baking soda in 8 ounces of water. Swish and spit 4 times a day (after meals and at bedtime). Also avoid mouthwashes that contain alcohol.    Schedule for Medications to Take at Home to Prevent and Treat Nausea and Vomiting     Day 1 (Chemotherapy Infusion Appointment) Day 2 Day 3   Morning Premedications to be given in clinic. Ondansetron disintegrating (Zofran ODT) 8 mg tablets:  Take 1 tablet by mouth in  the morning. Ondansetron disintegrating (Zofran ODT) 8 mg tablets:  Take 1 tablet by mouth in the morning.   Night  Ondansetron disintegrating (Zofran ODT) 8 mg tablets:  Take 1 tablet by mouth at night. Ondansetron disintegrating (Zofran ODT) 8 mg tablets:  Take 1 tablet by mouth at night.     As needed medications for nausea:  ?? Ondansetron disintegrating (Zofran ODT) 8 mg tablets: *Do not take on day 1 of chemotherapy cycles (chemotherapy infusion appointment days). Take as directed on days 2 through 3 each cycle, but you may take up to 1 tablet (8 mg total) every eight (8) hours as needed for nausea.    As needed medications for diarrhea:  ??? Loperamide (Imodium) 2 mg capsules: Take 2 capsules (4 mg total) by mouth to start if diarrhea develops, then 1 capsule (2 mg total) every 2 hours until diarrhea is controlled. Discontinue if no loose bowel movement in the previous 12 hours.    All of the patient's questions were answered and the patient confirmed understanding of how to take their medication.    Konrad Penta, PharmD, BCOP, CPP        I spent 76 minutes on the phone with the patient on the date of service. I spent an additional 30 minutes on pre- and post-visit activities on the date of service.     The patient was physically located in [place] or a state in which I am permitted to provide care. The patient agreed to the telemedicine visit. The visit was reasonable and appropriate under the circumstances given the patient's presentation at the time.    The patient and/or parent/guardian has been advised of the potential risks and limitations of this mode of treatment (including, but not limited to, the absence of in-person examination) and has agreed to be treated using telemedicine. The patient's/patient's family's questions regarding telemedicine have been answered.     If the visit was completed in an ambulatory setting, the patient and/or parent/guardian has also been advised to contact their provider???s office for worsening conditions, and seek emergency medical treatment and/or call 911 if the patient deems either necessary.

## 2021-10-07 ENCOUNTER — Ambulatory Visit
Admit: 2021-10-07 | Discharge: 2021-10-14 | Disposition: A | Discharge status: Home / Self Care | Payer: PRIVATE HEALTH INSURANCE | Admitting: Internal Medicine

## 2021-10-07 ENCOUNTER — Ambulatory Visit: Admit: 2021-10-07 | Discharge: 2021-10-14 | Payer: PRIVATE HEALTH INSURANCE

## 2021-10-07 ENCOUNTER — Encounter: Admit: 2021-10-07 | Discharge: 2021-10-14 | Payer: PRIVATE HEALTH INSURANCE

## 2021-10-07 LAB — COMPREHENSIVE METABOLIC PANEL
ALBUMIN: 4.3 g/dL (ref 3.4–5.0)
ALKALINE PHOSPHATASE: 97 U/L (ref 46–116)
ALT (SGPT): 41 U/L (ref 10–49)
ANION GAP: 7 mmol/L (ref 5–14)
AST (SGOT): 41 U/L — ABNORMAL HIGH (ref <=34)
BILIRUBIN TOTAL: 0.9 mg/dL (ref 0.3–1.2)
BLOOD UREA NITROGEN: 7 mg/dL — ABNORMAL LOW (ref 9–23)
BUN / CREAT RATIO: 12
CALCIUM: 9.3 mg/dL (ref 8.7–10.4)
CHLORIDE: 101 mmol/L (ref 98–107)
CO2: 28 mmol/L (ref 20.0–31.0)
CREATININE: 0.6 mg/dL
EGFR CKD-EPI (2021) FEMALE: >90 mL/min/{1.73_m2} (ref >=60)
GLUCOSE RANDOM: 210 mg/dL — ABNORMAL HIGH (ref 70–179)
POTASSIUM: 3.8 mmol/L (ref 3.4–4.8)
PROTEIN TOTAL: 8.1 g/dL (ref 5.7–8.2)
SODIUM: 136 mmol/L (ref 135–145)

## 2021-10-07 LAB — URINALYSIS WITH CULTURE REFLEX
BACTERIA: NONE SEEN /HPF
BILIRUBIN UA: NEGATIVE
GLUCOSE UA: 200 — AB
KETONES UA: 40 — AB
NITRITE UA: NEGATIVE
PH UA: 6 (ref 5.0–9.0)
PROTEIN UA: 50 — AB
RBC UA: 4 /HPF (ref <=4)
SPECIFIC GRAVITY UA: 1.028 (ref 1.003–1.030)
SQUAMOUS EPITHELIAL: 10 /HPF — ABNORMAL HIGH (ref 0–5)
UROBILINOGEN UA: <2
WBC UA: 12 /HPF — ABNORMAL HIGH (ref 0–5)

## 2021-10-07 LAB — CBC W/ AUTO DIFF
BASOPHILS ABSOLUTE COUNT: 0 10*9/L (ref 0.0–0.1)
BASOPHILS RELATIVE PERCENT: 0.3 %
EOSINOPHILS ABSOLUTE COUNT: 0.1 10*9/L (ref 0.0–0.5)
EOSINOPHILS RELATIVE PERCENT: 0.8 %
HEMATOCRIT: 40.5 % (ref 34.0–44.0)
HEMOGLOBIN: 12.9 g/dL (ref 11.3–14.9)
LYMPHOCYTES ABSOLUTE COUNT: 0.7 10*9/L — ABNORMAL LOW (ref 1.1–3.6)
LYMPHOCYTES RELATIVE PERCENT: 5.2 %
MEAN CORPUSCULAR HEMOGLOBIN CONC: 31.8 g/dL — ABNORMAL LOW (ref 32.0–36.0)
MEAN CORPUSCULAR HEMOGLOBIN: 25.9 pg (ref 25.9–32.4)
MEAN CORPUSCULAR VOLUME: 81.6 fL (ref 77.6–95.7)
MEAN PLATELET VOLUME: 7.5 fL (ref 6.8–10.7)
MONOCYTES ABSOLUTE COUNT: 0.6 10*9/L (ref 0.3–0.8)
MONOCYTES RELATIVE PERCENT: 4.5 %
NEUTROPHILS ABSOLUTE COUNT: 11.9 10*9/L — ABNORMAL HIGH (ref 1.8–7.8)
NEUTROPHILS RELATIVE PERCENT: 89.2 %
PLATELET COUNT: 325 10*9/L (ref 150–450)
RED BLOOD CELL COUNT: 4.97 10*12/L (ref 3.95–5.13)
RED CELL DISTRIBUTION WIDTH: 16.4 % — ABNORMAL HIGH (ref 12.2–15.2)
WBC ADJUSTED: 13.3 10*9/L — ABNORMAL HIGH (ref 3.6–11.2)

## 2021-10-07 LAB — LACTATE SEPSIS, VENOUS: LACTATE BLOOD VENOUS: 1.7 mmol/L (ref 0.5–1.8)

## 2021-10-07 MED ADMIN — acetaminophen (TYLENOL) tablet 1,000 mg: 1000 mg | ORAL | @ 21:00:00 | Stop: 2021-10-07

## 2021-10-07 MED ADMIN — ondansetron (ZOFRAN) injection 4 mg: 4 mg | INTRAVENOUS | @ 22:00:00 | Stop: 2021-10-07

## 2021-10-07 MED ADMIN — vancomycin (VANCOCIN) 2,500 mg in sodium chloride (NS) 0.9 % 500 mL IVPB: 2500 mg | INTRAVENOUS | @ 21:00:00 | Stop: 2021-10-07

## 2021-10-07 MED ADMIN — cefepime (MAXIPIME) 2 g in sodium chloride 0.9 % (NS) 100 mL IVPB-connector bag: 2 g | INTRAVENOUS | @ 20:00:00 | Stop: 2021-10-07

## 2021-10-07 MED ADMIN — sodium chloride 0.9% (NS) bolus 3,780 mL: 30 mL/kg | INTRAVENOUS | @ 21:00:00 | Stop: 2021-10-07

## 2021-10-07 NOTE — Unmapped (Unsigned)
[facility] GI MEDICAL ONCOLOGY CLINIC NOTE    Encounter Date: 10/10/2021    PCP:   Terrall Laity, PA    Consulting Providers:  Dr. Alden Server [facility]   ____________________________________________________________________  Oncology History:   Diagnosis: sigmoid colon adenocarcinoma  Current Goal of Therapy: adjuvant, curative  - 08/2021 patient underwent EGD and full colonoscopy in setting of generalized abdominal pain and change in bowel habits revealing a mass in sigmoid colon with near total obstruction that was biopsied as moderately differentiated adenocarcinoma. Also had a descending colon tubulovillous adenoma and a tubular adenoma in transverse colon. Stomach biopsy revealed chronic gastritis (no H. Pylori). CEA 1.   - 09/05/21 open sigmoid colectomy with primary anastomosis with pathology revealing moderately differentiated adenocarcinoma invading through MP into pericolonic tissues, negative margins, no PNI/LVI, no tumor deposits or perforation, 3/17 LNs positive for cancer, pMMR. Staged as pT3N1b  - 10/10/21-current CAPOX x 4 cycles     Molecular: pMMR    ____________________________________________________________________  Assessment and Plan:  Nina Mitchell  is a 51 y.o. female who presented for evaluation and recommendations regarding sigmoid colon adenocarcinoma.     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 For her low-risk stage III colon cancer, we discussed adjuvant therapy with CAPOX x 3 months. Labs today all adequate to start treatment   - C1 CAPOX today  - port placement per patient preference  - Surveillance:              - H&P + CEA: every 3-6 months x 2 years, then every 6 months x 3 years              - Scans: annually, due 07/23/22              - Colonoscopy: due 08/2022  ??  Pulmonary Nodules: multiple pulmonary nodules noted on 09/29/21 scans that were not definitively seen on previous scans (though reports note some nodules in past). We will keep a close eye on these and repeat imaging after finishing adjuvant therapy.  - CT chest after finishing three months of CAPOX    Multiple Sclerosis: managed by local neurologist  ??  T2DM: managed by local PCP  - cont metformin, Farxiga    Supportive Care:  CINV: PRN Zofran and Clonopin for second line     Follow-Up: ***    This note was created with dictation software. Please excuse any transcription errors.   _____________________________________________________________________  Interval History:  Nina Mitchell  is a 50 y.o. female who presented for a return visit regarding ***.     ***    Past Medical History:  HTN  T2DM  HL  GERD  Hypothyroidism  Mood disorder   Multiples sclerosis  Migraine    Social History:  Never smoker  Rarely consumes alcohol (1-2x per year, mixed drink)    Review of Systems:  A ten-point review of systems was conducted and negative unless stated in HPI.    Physical Exam:  VITALS: There were no vitals taken for this visit.  CONSTITUTIONAL: in NAD, appears stated age, able to get on exam table unassisted  HEENT: MMM, no oral lesions or exudates, neck supple  CARDIO: normal rate, regular rhythm, no murmurs, port ***  RESPIRATORY: CTAB b/l, normal WOB on RA, no conversational dyspnea  GI: soft, NT, ND, no appreciable HSM  EXTREMITIES: no  LE edema  SKIN: no rashes or eccymoses  NEUROLOGIC: alert and oriented, no gross focal deficits noted   LYMPH: no supraclavicular, axillary, or cervical LAD  PSYCH: appropriate mood and affect, good insight and judgment     Labs:  Reviewed In Epic  ***    Imaging:  I personally reviewed the following imaging studies and my interpretation is:  CT Chest 09/29/21:  - multiple subcentimeter pulmonary nodules that were not obviously seen on previous scans (though some were present)  - multiple sclerotic bone lesions that were present on previous scans

## 2021-10-07 NOTE — Unmapped (Signed)
Returned VM to pt who is s/p RIJ port placement x 09/29/21.  She c/o increasing new soreness along the catheter tract and now w/ redness and fever of 102.5. Denies observable issues at neck venotomy or port reservoir sites. I instructed her to closest ED for further assessment of a potential CLI.  She voiced understanding and stated she would have her husband take her to [facility] ED.

## 2021-10-07 NOTE — Unmapped (Signed)
Pt reports having port placed last week. Called in by PCP for possible infection. Redness around site, N/V.

## 2021-10-07 NOTE — Unmapped (Signed)
[facility]  Emergency Department Medical Screening Examination     Subjective     Nina Mitchell is a 50 y.o. female presenting for evaluation of Portacath Problem. The patient reports redness around her port that was placed last week. She endorses nausea and emesis.      Abbreviated Review of Systems/Covid Screen  Constitutional: Negative for fever  Respiratory: Negative for cough. Negative for difficulty breathing.    Objective     ED Triage Vitals   Enc Vitals Group      BP 10/07/21 1303 142/86      Heart Rate 10/07/21 1250 125      SpO2 Pulse --       Resp 10/07/21 1303 20      Temp 10/07/21 1303 (!) 39.3 ??C (102.7 ??F)      Temp Source 10/07/21 1303 Oral      SpO2 10/07/21 1250 95 %        Focused Physical Exam  Constitutional: No acute distress.  Respiratory: Non-labored respirations.  Neurological: Clear speech. No gross focal neurologic deficits are appreciated.  ?  Assessment & Plan     Patient appears nontoxic but highly concerned for sepsis 2/2 port. Plan for CXR, Lactate, UA, blood cultures, and basic labs. Abx ordered. Will defer the remainder of workup to the main ED provider once bed becomes available. ASI 2, should have bed soon.   A medical screening exam has been performed. At the time of this evaluation, no emergency medical condition requiring immediate stabilization has been identified nor is there suspicion for imminent decompensation. Appropriate triage protocols will be implemented and a comprehensive ED evaluation with disposition will be completed by a healthcare provider when an appropriate ED location becomes available. The patient is aware that this is an initial encounter only and verbalizes understanding and agreement with the plan.     Emergency Department operations continue to be impacted by the COVID-19 pandemic.     Documentation assistance was provided by Ovid Curd, Scribe on October 07, 2021 at 1:04 PM for Ree Edman, MD.      Documentation assistance was provided by the scribe in my presence.  The documentation recorded by the scribe has been reviewed by me and accurately reflects the services I personally performed.

## 2021-10-07 NOTE — Unmapped (Signed)
Emergency Department Provider Note      ED Assessment/Plan     Nina Mitchell is a pleasant 50 y.o. female w/ a history of HTN, obesity, and adenocarcinoma of sigmoid colon s/p colonic resection who presents for fever and port redness.     On presentation, vitals were notable for tachycardia to the 110s, fever to 102.7, hypertension. Physical exam revealed redness and tenderness to the right-sided chest port.     Bedside POCUS showed no discrete fluid collection to the line of the port.    Sepsis work-up started with vancomycin, cefepime, fluids, RPP, and labs.    At time of signout at 3:00 PM, plan was as follows:   -- Follow-up labs  -- Admission to medicine for IV antibiotics, culture follow-up, and potential consult VIR for port exchange    History     Chief Complaint: Fever, redness and pain around port     HPI: Nina Mitchell  is a pleasant 50 y.o. female w/ a history of HTN, obesity, and adenocarcinoma of sigmoid colon s/p colonic resection who presents for fever and port redness.  She reports that she was in her usual state of health until today when she began to get a fever to T-max 102 and had some redness and pain develop over her right sided port.  This port is new, placed on 09/29/2021.  She does note that she had some cough, sore throat, and vomiting over the last several days.  She denies chest pain, shortness of breath, diarrhea.  She has not started chemotherapy yet.    ROS: A 10-pt ROS was performed and was negative except as noted in HPI     PMH/PSH:   Past Medical History:   Diagnosis Date   ??? Adenocarcinoma of sigmoid colon (CMS-HCC) 09/18/2021   ??? Anxiety    ??? Arthritis Jaw and other areas    2009   ??? Depression    ??? Disease of thyroid gland    ??? GERD (gastroesophageal reflux disease)    ??? HTN (hypertension)    ??? Mild intermittent asthma without complication    ??? Obesity         Allergies:   Allergies   Allergen Reactions   ??? Bupropion Anxiety and Other (See Comments) ANXIETY  ANXIETY  ANXIETY  ANXIETY  ANXIETY     ??? Prochlorperazine Maleate Rash     Other Reaction: jittery;tachycardia   ??? Sulfa (Sulfonamide Antibiotics) Hives   ??? Sulfamethoxazole-Trimethoprim Nausea Only and Nausea And Vomiting   ??? Wellbutrin [Bupropion Hcl] Other (See Comments)     ANXIETY   ??? Benzonatate Rash   ??? Cefaclor Rash     Other Reaction: neck swelling, but tolerate rocephin IM fine per pt.       Social:   Social History     Tobacco Use   ??? Smoking status: Never   ??? Smokeless tobacco: Never   Substance Use Topics   ??? Alcohol use: Yes     Alcohol/week: 0.0 standard drinks     Types: 1 drink(s) per week     Comment: RARELY   ??? Drug use: No       Physical Exam     Vitals:   Vitals:    10/07/21 1250 10/07/21 1303   BP:  142/86   Pulse: 125 117   Resp:  20   Temp:  (!) 39.3 ??C (102.7 ??F)   TempSrc:  Oral   SpO2: 95%  Physical Exam:   General: Tired appearing adult female  HEENT: Head atraumatic. Moist mucous membranes. No scleral icterus or conjunctivitis.    Cardiac: Tachycardic. No MRG.    Pulmonary: Breathing comfortably w/ no accessory muscle usage. Clear to auscultation bilaterally.    Abdominal: Soft, nontender, nondistended abdomen.    GU: Deferred   MSK: No lower extremity edema.    Dermatologic: Redness over the right sided chest port with no palpable mass or discrete swelling.  No jaundice or pallor.    Neurologic: Alert. Follows commands. Moving all extremities spontaneously.   Psychiatric: Normal mood and behavior.       Glean Salen, MD   PGY-2, Emergency Medicine                   Sheppard Evens, MD  Resident  10/07/21 1440

## 2021-10-07 NOTE — Telephone Encounter (Signed)
Tried calling phone# pt provided and [phone]. Insurance on central standard time/does not open until 8am.  Tried initiating another PA on CMM. Key: [id number]. Received the same response: "Please advise the dispensing pharmacy to contact the [place] at [phone] for assistance."

## 2021-10-07 NOTE — Telephone Encounter (Signed)
Called phone# pt provided and spoke w/ pt insurance/[employer]. Verified no PA needed. Claim rejecting d/t pharmacy needing to complete DUR (drug utilization review override). They normally will need to complete this if pt on opioid/benzos.    I called pharmacy at [phone]. Spoke w/ pharmacist, Estill Bamberg. Relayed above info. She verbalized understanding. She was able to get paid claim, did not have to do override. Nothing further needed.

## 2021-10-08 ENCOUNTER — Telehealth: Payer: Self-pay | Admitting: *Deleted

## 2021-10-08 LAB — CBC W/ AUTO DIFF
BASOPHILS ABSOLUTE COUNT: 0 10*9/L (ref 0.0–0.1)
BASOPHILS RELATIVE PERCENT: 0.5 %
EOSINOPHILS ABSOLUTE COUNT: 0 10*9/L (ref 0.0–0.5)
EOSINOPHILS RELATIVE PERCENT: 0 %
HEMATOCRIT: 35.4 % (ref 34.0–44.0)
HEMOGLOBIN: 11.6 g/dL (ref 11.3–14.9)
LYMPHOCYTES ABSOLUTE COUNT: 0.4 10*9/L — ABNORMAL LOW (ref 1.1–3.6)
LYMPHOCYTES RELATIVE PERCENT: 5.7 %
MEAN CORPUSCULAR HEMOGLOBIN CONC: 32.8 g/dL (ref 32.0–36.0)
MEAN CORPUSCULAR HEMOGLOBIN: 26.1 pg (ref 25.9–32.4)
MEAN CORPUSCULAR VOLUME: 79.8 fL (ref 77.6–95.7)
MEAN PLATELET VOLUME: 7.2 fL (ref 6.8–10.7)
MONOCYTES ABSOLUTE COUNT: 0.3 10*9/L (ref 0.3–0.8)
MONOCYTES RELATIVE PERCENT: 3.8 %
NEUTROPHILS ABSOLUTE COUNT: 7 10*9/L (ref 1.8–7.8)
NEUTROPHILS RELATIVE PERCENT: 90 %
PLATELET COUNT: 268 10*9/L (ref 150–450)
RED BLOOD CELL COUNT: 4.44 10*12/L (ref 3.95–5.13)
RED CELL DISTRIBUTION WIDTH: 16.4 % — ABNORMAL HIGH (ref 12.2–15.2)
WBC ADJUSTED: 7.7 10*9/L (ref 3.6–11.2)

## 2021-10-08 LAB — COMPREHENSIVE METABOLIC PANEL
ALBUMIN: 3.4 g/dL (ref 3.4–5.0)
ALKALINE PHOSPHATASE: 75 U/L (ref 46–116)
ALT (SGPT): 40 U/L (ref 10–49)
ANION GAP: 10 mmol/L (ref 5–14)
AST (SGOT): 50 U/L — ABNORMAL HIGH (ref <=34)
BILIRUBIN TOTAL: 0.7 mg/dL (ref 0.3–1.2)
BLOOD UREA NITROGEN: 6 mg/dL — ABNORMAL LOW (ref 9–23)
BUN / CREAT RATIO: 10
CALCIUM: 8.4 mg/dL — ABNORMAL LOW (ref 8.7–10.4)
CHLORIDE: 102 mmol/L (ref 98–107)
CO2: 24 mmol/L (ref 20.0–31.0)
CREATININE: 0.58 mg/dL — ABNORMAL LOW
EGFR CKD-EPI (2021) FEMALE: >90 mL/min/{1.73_m2} (ref >=60)
GLUCOSE RANDOM: 194 mg/dL — ABNORMAL HIGH (ref 70–179)
POTASSIUM: 3.6 mmol/L (ref 3.4–4.8)
PROTEIN TOTAL: 7 g/dL (ref 5.7–8.2)
SODIUM: 136 mmol/L (ref 135–145)

## 2021-10-08 MED ADMIN — ondansetron (ZOFRAN) injection 4 mg: 4 mg | INTRAVENOUS | @ 20:00:00

## 2021-10-08 MED ADMIN — cefepime (MAXIPIME) 2 g in sodium chloride 0.9 % (NS) 100 mL IVPB-connector bag: 2 g | INTRAVENOUS | @ 03:00:00 | Stop: 2021-10-12

## 2021-10-08 MED ADMIN — ketorolac (TORADOL) injection 15 mg: 15 mg | INTRAVENOUS | @ 19:00:00 | Stop: 2021-10-13

## 2021-10-08 MED ADMIN — insulin lispro (HumaLOG) injection 0-20 Units: 0-20 [IU] | SUBCUTANEOUS | @ 23:00:00

## 2021-10-08 MED ADMIN — escitalopram oxalate (LEXAPRO) tablet 20 mg: 20 mg | ORAL | @ 13:00:00

## 2021-10-08 MED ADMIN — atorvastatin (LIPITOR) tablet 10 mg: 10 mg | ORAL | @ 03:00:00

## 2021-10-08 MED ADMIN — vancomycin (VANCOCIN) 2000 mg IVPB in 500 mL sodium chloride 0.9% (premix): 2000 mg | INTRAVENOUS | @ 11:00:00 | Stop: 2021-10-08

## 2021-10-08 MED ADMIN — acetaminophen (TYLENOL) tablet 650 mg: 650 mg | ORAL

## 2021-10-08 MED ADMIN — heparin, porcine (PF) 100 unit/mL injection 300 Units: 300 [IU] | INTRAVENOUS | @ 08:00:00 | Stop: 2021-10-08

## 2021-10-08 MED ADMIN — insulin lispro (HumaLOG) injection 0-20 Units: 0-20 [IU] | SUBCUTANEOUS | @ 13:00:00

## 2021-10-08 MED ADMIN — enoxaparin (LOVENOX) syringe 40 mg: 40 mg | SUBCUTANEOUS | @ 03:00:00

## 2021-10-08 MED ADMIN — nafcillin (NALLPEN) 2 g in sodium chloride 0.9 % (NS) 100 mL IVPB-connector bag: 2 g | INTRAVENOUS | @ 14:00:00 | Stop: 2021-10-13

## 2021-10-08 MED ADMIN — midazolam (VERSED) injection: INTRAVENOUS | @ 21:00:00 | Stop: 2021-10-08

## 2021-10-08 MED ADMIN — nafcillin (NALLPEN) 2 g in sodium chloride 0.9 % (NS) 100 mL IVPB-connector bag: 2 g | INTRAVENOUS | @ 19:00:00 | Stop: 2021-10-13

## 2021-10-08 MED ADMIN — levothyroxine (SYNTHROID) tablet 25 mcg: 25 ug | ORAL | @ 11:00:00

## 2021-10-08 MED ADMIN — famotidine (PEPCID) tablet 20 mg: 20 mg | ORAL | @ 13:00:00

## 2021-10-08 MED ADMIN — sodium chloride (NS) 0.9 % infusion: INTRAVENOUS | @ 21:00:00 | Stop: 2021-10-08

## 2021-10-08 MED ADMIN — midazolam (VERSED) injection: INTRAVENOUS | @ 22:00:00 | Stop: 2021-10-08

## 2021-10-08 MED ADMIN — gabapentin (NEURONTIN) capsule 300 mg: 300 mg | ORAL | @ 03:00:00

## 2021-10-08 MED ADMIN — ketorolac (TORADOL) injection 15 mg: 15 mg | INTRAVENOUS | @ 08:00:00 | Stop: 2021-10-08

## 2021-10-08 MED ADMIN — enoxaparin (LOVENOX) syringe 40 mg: 40 mg | SUBCUTANEOUS | @ 13:00:00

## 2021-10-08 MED ADMIN — acetaminophen (TYLENOL) tablet 650 mg: 650 mg | ORAL | @ 08:00:00

## 2021-10-08 MED ADMIN — ondansetron (ZOFRAN) tablet 8 mg: 8 mg | ORAL | @ 03:00:00

## 2021-10-08 MED ADMIN — insulin lispro (HumaLOG) injection 0-20 Units: 0-20 [IU] | SUBCUTANEOUS | @ 18:00:00

## 2021-10-08 MED ADMIN — cefepime (MAXIPIME) 2 g in sodium chloride 0.9 % (NS) 100 mL IVPB-connector bag: 2 g | INTRAVENOUS | @ 10:00:00 | Stop: 2021-10-08

## 2021-10-08 MED ADMIN — ondansetron (ZOFRAN) tablet 8 mg: 8 mg | ORAL | @ 11:00:00 | Stop: 2021-10-08

## 2021-10-08 MED ADMIN — acetaminophen (TYLENOL) tablet 650 mg: 650 mg | ORAL | @ 18:00:00

## 2021-10-08 MED ADMIN — clonazePAM (KlonoPIN) tablet 0.5 mg: .5 mg | ORAL | @ 01:00:00

## 2021-10-08 MED ADMIN — lidocaine (XYLOCAINE) 10 mg/mL (1 %) injection: INTRADERMAL | @ 22:00:00 | Stop: 2021-10-08

## 2021-10-08 NOTE — Unmapped (Signed)
Infectious Disease ([facility]) Progress Note    Assessment & Plan:   Nina Mitchell is a 50 y.o. female with a PMHx of HTN, T2DM, HLD, GERD, hypothyroidism, depression/anxiety, migraines, and recent diagnosis of stage IIIb (Z6X0R) left-sided colon adenocarcinoma (October 2022) s/p open sigmoid colectomy (09/05/21) and recent RIJ chest port placement (09/29/21) who presented to [facility] ED on 11/15 due to fevers and emesis.     Principal Problem:    Fever of undetermined origin  Active Problems:    Mild intermittent asthma without complication    Gastroesophageal reflux disease without esophagitis    Situational mixed anxiety and depressive disorder    Intractable migraine without aura and without status migrainosus    Disease of thyroid gland    Obstructive sleep apnea syndrome    Depression    Acute bilateral low back pain without sciatica    HTN (hypertension)    Anxiety    Adenocarcinoma of sigmoid colon (CMS-HCC)  Resolved Problems:    * No resolved hospital problems. *      Fever on unknown origin - Possible port infection  Presented with discomfort in R neck, mild redness/tenderness at port site, fever, chills, N/V, body aches, cough, sore throat for 1 day. Tmax 103.78F thus far. CXR and ECG unremarkable. Bedside POCUS showed no discrete fluid collection to the line of the port. S/p sepsis fluids, cefepime, vanc in ED. Low suspicion for endocarditis, not proceeding with echo at this time.    -BCx with MSSA, narrowing to nafcillin with plan for 14d total of abx (EED tbd based on when/if port is removed)  -CBC with diff daily  -F/u BCx susceptibilities, UCx  -IV Zofran 4mg  for nausea  -Tylenol + Toradol for headaches, can consider home triptan or butalbital/caffeine if persists   -Oncology c/s'd, appreciate recs  ??  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 Received port on 11/07. Was set to start capecitabine this week but was not able to I/s/o new sx.   -Oncology c/s'd, appreciate recs.     Chronic Problems:  T2DM: Atorvastatin 10 mg, SSI   Anxiety - Depression: Clonazepam 0.5 mg daily PRN, escitalopram oxalate 20 mg daily  GERD: Famotidine 20 daily   Back pain: Gabapentin 300 nightly, tizanidine 4 mg PRN   HTN: Holding BP meds for now given the possible infection, will follow up in AM   Hypothyroidism: Synthroid 25 mcg PO daily     Daily Checklist:  Diet: Regular Diet  DVT PPx: Lovenox 40mg  q24h  Electrolytes: Replete Potassium to >/= 3.6 and Magnesium to >/= 1.8  Code Status: Full Code  Dispo: Floor    Team Contact Information:   Primary Team: Infectious Disease ([facility])  Primary Resident: Yehuda Savannah, MD  Resident's Pager: [phone] (Infect Disease Intern - [name])    Interval History:   Overnight, the patient had significant headaches and nausea. Headaches helped with tylenol + toradol, nausea helped most by IV zofran with less effectiveness of PO zofran.    ROS: endorses nausea, headache, anxiety, feeling very warm this morning. Denies chest or abdominal pain, difficulty breathing, pain/irritation at port site.     Objective:   Temp:  [36.5 ??C (97.7 ??F)-39.7 ??C (103.5 ??F)] 37.8 ??C (100 ??F)  Heart Rate:  [81-117] 87  SpO2 Pulse:  [81-117] 93  Resp:  [12-28] 18  BP: (86-182)/(52-105) 144/75  SpO2:  [89 %-100 %] 93 %    Gen: lying  in bed and uncomfortable appearing but in NAD, answers questions appropriately  Eyes: sclera anicteric  HENT: atraumatic, MMM  Heart: RRR, S1, S2, no M/R/G, no chest wall tenderness, cap refill < 2s  Lungs: CTAB, no crackles or wheezes, no use of accessory muscles  Abdomen: Normoactive bowel sounds, soft, NTND, no rebound/guarding  Extremities: no clubbing, cyanosis, or edema in the BLEs  Skin: No redness around port site   Psych: Alert, oriented, appropriate mood and affect    Labs/Studies: Labs and Studies from the last 24hrs per EMR and Reviewed    [name], MS3  October 08, 2021 6:47 AM    I attest that I have reviewed the student note and that the components of the history of the present illness, the physical exam, and the assessment and plan documented were performed by me or were performed in my presence by the student where I verified the documentation and performed (or re-performed) the exam and medical decision making.     Yehuda Savannah, MD  PGY1, Internal Medicine

## 2021-10-08 NOTE — Unmapped (Addendum)
Nina Mitchell is a 50 y.o. female with a PMHx of HTN, T2DM, HLD, GERD, hypothyroidism, depression/anxiety, migraines, and recent diagnosis of stage IIIb (Z6X0R) left-sided colon adenocarcinoma (October 2022) s/p open sigmoid colectomy (09/05/21) and recent RIJ chest port placement (09/29/21) who presented to [facility] ED on 11/15 due to fevers and emesis.     Sepsis due to port infection - Uncomplicated MSSA bacteremia  Presented with discomfort in R neck, mild redness/tenderness at port site, fever, chills, N/V, body aches, cough, sore throat for 1 day after being told to come in by VIR. Tmax during admission was 103.32F. ED workup included negative CXR and EKG, bedside POCUS without discrete fluid collection under port, CBC with leukocytosis to 13.3 with left shift. She was started empirically on vancomycin/cefepime and sepsis fluids on 11/15. Blood cultures grew MSSA and she was switched to Nafcillin. Persistent nausea and headaches were managed supportively with IV zofran and NSAIDs. Urine cultures grew nothing. Without another clear source of infection, port was thought to be most likely source and was removed on 11/16 after discussion with oncology. Clinical status improved significantly after port removal. Nina Mitchell was having some loose stools beginning on admission day 2; in the absence of abdominal pain or other symptoms, this was attributed to nafcillin disruption of gut flora in combination with reported anxiety from patient and was managed with loperamide. Discharged on IV nafcillin with EED 10/22/21 and plan to monitor CBC with differential and CMP via home health, labs to go to Dr. Rozell Searing (Oncologist).     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 Received port on 11/07. Was set to start capecitabine this week but was not able to I/s/o new sx. Oncology was consulted during hospitalization and was in agreement with port removal. The port was removed on 11/17 with plan to adjust chemo initiation timeline outpatient with Dr. Rozell Searing, f/u planned for 12/2. No new port needed at this time per oncology team. Sent home with PICC line set to stay in following abx course for chemo administration.     Mild transaminase elevation  Transaminases mildly elevated during this hospitalization (peak AST/ALT 120/98), as well as dating back to 2018. Benign abdominal exam and patient reports CT without evidence of metastases.  Given body habitus, patient may have mild steatohepatitis. RUQ US showed an enlarged echogenic liver consistent with hepatic steatosis. Recommend counseling on diet & lifestyle modifications outpatient as appropriate.     Chronic Problems:  T2DM: Continued Atorvastatin 10 mg, SSI   Anxiety - Depression: Continued Clonazepam 0.5 mg daily PRN, escitalopram oxalate 20 mg daily  GERD: Continued Famotidine 20 daily   Back pain: Continued Gabapentin 300 nightly, tizanidine 4 mg PRN   HTN: Held home lisinopril and atenolol until 11/20 after multiple hypertensive pressures 11/19-20  Hypothyroidism: Continued Synthroid 25 mcg PO daily

## 2021-10-08 NOTE — Unmapped (Signed)
VASCULAR INTERVENTIONAL RADIOLOGY INPATIENT  CONSULTATION     Requesting Attending Physician: Billy Coast, MD  Service Requesting Consult: Infectious Disease (MDK)    Date of Service: 10/08/2021  Consulting Interventional Radiologist: Dr. Ammie Dalton     HPI:     Reason for consult: Port removal    History of Present Illness:   Nina Mitchell is a 50 y.o. female with y.o. female with a PMHx of HTN, T2DM, HLD, GERD, hypothyroidism, depression/anxiety, migraines, and recent diagnosis of stage IIIb (T3N1b)??left-sided colon adenocarcinoma??(October 2022) s/p open sigmoid colectomy (09/05/21) and recent RIJ chest port placement (09/29/21)??who presented to [facility] ED on 11/15 due to fevers and emesis.??    White blood cell count normal, previously 13.3.  Hemodynamically stable.   Currently afebrile.       Patient seen in consultation at the request of primary care team for consideration for port removal in the setting of bacteremia.    Review of Systems:  Pertinent items are noted in HPI.    Medical History:     Past Medical History:  Past Medical History:   Diagnosis Date   ??? Adenocarcinoma of sigmoid colon (CMS-HCC) 09/18/2021   ??? Anxiety    ??? Arthritis Jaw and other areas    2009   ??? Depression    ??? Disease of thyroid gland    ??? GERD (gastroesophageal reflux disease)    ??? HTN (hypertension)    ??? Mild intermittent asthma without complication    ??? Obesity        Surgical History:  Past Surgical History:   Procedure Laterality Date   ??? IR INSERT PORT AGE GREATER THAN 5 YRS  09/29/2021    IR INSERT PORT AGE GREATER THAN 5 YRS 09/29/2021 Trude Mcburney, MD IMG VIR [facility]   ??? PR HYSTEROSCOPY,W/ENDOMETRIAL ABLATION N/A 04/17/2015    Procedure: HYSTEROSCOPY SURGICAL; W/ENDOMETRIAL ABLATION (EG, ENDOMETRIAL RESECT, ELECTROSURGICAL ABLATION, THERMOABL);  Surgeon: Orma Render, MD;  Location: OR [facility];  Service: Gynecology   ??? SINUS SURGERY     ??? TEMPOROMANDIBULAR JOINT SURGERY     ??? TYMPANOSTOMY TUBE PLACEMENT         Family History:  Family History   Problem Relation Age of Onset   ??? Cancer Mother    ??? Heart disease Mother    ??? Thyroid disease Mother    ??? Breast cancer Mother 26   ??? Heart disease Father    ??? COPD Maternal Grandmother    ??? Heart disease Maternal Grandmother        Medications:   Current Facility-Administered Medications   Medication Dose Route Frequency Provider Last Rate Last Admin   ??? acetaminophen (TYLENOL) tablet 650 mg  650 mg Oral Q6H PRN Yehuda Savannah, MD   650 mg at 10/08/21 1249   ??? atorvastatin (LIPITOR) tablet 10 mg  10 mg Oral Nightly Yehuda Savannah, MD   10 mg at 10/07/21 2151   ??? clonazePAM (KlonoPIN) tablet 0.5 mg  0.5 mg Oral Daily PRN Yehuda Savannah, MD   0.5 mg at 10/07/21 1941   ??? dextrose 50 % in water (D50W) 50 % solution 12.5 g  12.5 g Intravenous Q10 Min PRN Yehuda Savannah, MD       ??? enoxaparin (LOVENOX) syringe 40 mg  40 mg Subcutaneous Q12H [facility] Yehuda Savannah, MD   40 mg at 10/08/21 8119   ??? escitalopram oxalate (LEXAPRO) tablet 20 mg  20 mg Oral Daily Yehuda Savannah, MD   20 mg  at 10/08/21 0828   ??? famotidine (PEPCID) tablet 20 mg  20 mg Oral Daily Yehuda Savannah, MD   20 mg at 10/08/21 1478   ??? gabapentin (NEURONTIN) capsule 300 mg  300 mg Oral Nightly Yehuda Savannah, MD   300 mg at 10/07/21 2151   ??? glucagon injection 1 mg  1 mg Intramuscular Once PRN Yehuda Savannah, MD       ??? glucose chewable tablet 16 g  16 g Oral Q10 Min PRN Yehuda Savannah, MD       ??? insulin lispro (HumaLOG) injection 0-20 Units  0-20 Units Subcutaneous ACHS Yehuda Savannah, MD   4 Units at 10/08/21 1249   ??? ketorolac (TORADOL) injection 15 mg  15 mg Intravenous Q6H PRN Yehuda Savannah, MD   15 mg at 10/08/21 1350   ??? levothyroxine (SYNTHROID) tablet 25 mcg  25 mcg Oral Daily Yehuda Savannah, MD   25 mcg at 10/08/21 0538   ??? nafcillin (NALLPEN) 2 g in sodium chloride 0.9 % (NS) 100 mL IVPB-connector bag  2 g Intravenous Q4H [facility] Yehuda Savannah, MD 200 mL/hr at 10/08/21 1418 2 g at 10/08/21 1418   ??? ondansetron (ZOFRAN) injection 4 mg  4 mg Intravenous Q6H PRN Yehuda Savannah, MD       ??? tiZANidine (ZANAFLEX) tablet 4 mg  4 mg Oral Q8H PRN Yehuda Savannah, MD         Current Outpatient Medications   Medication Sig Dispense Refill   ??? ACCU-CHEK GUIDE ME GLUCOSE MTR Misc Use as directed.     ??? ACCU-CHEK GUIDE TEST STRIPS Strp TEST ONCE DAILY     ??? ACCU-CHEK SOFTCLIX LANCETS lancets TEST ONCE DAILY     ??? acetaminophen (TYLENOL) 500 MG tablet Take 500 mg by mouth every twelve (12) hours as needed for pain.     ??? atenolol (TENORMIN) 25 MG tablet Take 1 mg by mouth nightly.     ??? clonazePAM (KLONOPIN) 0.5 MG tablet 0.5 mg daily as needed.     ??? docusate sodium (COLACE) 100 MG capsule Take 100 mg by mouth nightly as needed for constipation.     ??? ESCITALOPRAM OXALATE ORAL 20 mg daily.     ??? famotidine (PEPCID) 20 MG tablet Take 20 mg by mouth in the morning.     ??? gabapentin (NEURONTIN) 300 MG capsule Take 300 mg by mouth nightly.     ??? levothyroxine (SYNTHROID) 25 MCG tablet Take 25 mcg by mouth daily.     ??? lisinopriL (PRINIVIL,ZESTRIL) 2.5 MG tablet 2.5 mg  in the morning.     ??? multivitamin with minerals tablet Take 1 tablet by mouth daily.      ??? onabotulinumtoxinA (BOTOX) 200 unit SolR injection Inject 200 Units into the skin Every three (3) months.     ??? [START ON 10/09/2021] ondansetron (ZOFRAN-ODT) 8 MG disintegrating tablet Take 1 tablet by mouth twice daily on days 2 and 3 of chemotherapy cycles. May take up to 1 tablet every 8 hours as needed (but do not take on day 1 of chemotherapy cycles). 30 tablet 2   ??? oxyCODONE-acetaminophen (PERCOCET) 10-325 mg per tablet Take 1 tablet by mouth every six (6) hours as needed.   0   ??? rizatriptan (MAXALT-MLT) 10 MG disintegrating tablet Take 10 mg by mouth daily as needed.      ??? rosuvastatin (CRESTOR) 5 MG tablet Take 5 mg by mouth every evening.     ??? tiZANidine (ZANAFLEX) 4 MG tablet  Take 4 mg by mouth every eight (8) hours as needed.      ??? [START ON 10/09/2021] capecitabine (XELODA) 150 MG tablet Take 2 tablets (300 mg total) by mouth Two (2) times a day  with 1 other capecitabine prescription for 2,300 mg total. Take Days 1 through 14, then 1 week off. 56 tablet 3   ??? [START ON 10/09/2021] capecitabine (XELODA) 500 MG tablet Take 4 tablets (2,000 mg total) by mouth Two (2) times a day  with 1 other capecitabine prescription for 2,300 mg total. Take Days 1 through 14, then 1 week off. 112 tablet 3   ??? [START ON 10/09/2021] loperamide (IMODIUM) 2 mg capsule If having diarrhea, take 2 capsules (4 mg) after the first loose stool, then 1 capsule every 2 hours until diarrhea free for 12 hours. 60 capsule 11       Allergies:  Bupropion, Prochlorperazine maleate, Sulfa (sulfonamide antibiotics), Sulfamethoxazole-trimethoprim, Wellbutrin [bupropion hcl], Benzonatate, and Cefaclor    Social History:  Social History     Tobacco Use   ??? Smoking status: Never   ??? Smokeless tobacco: Never   Substance Use Topics   ??? Alcohol use: Yes     Alcohol/week: 0.0 standard drinks     Types: 1 drink(s) per week     Comment: RARELY   ??? Drug use: No       Objective:      Vital Signs:  Temp:  [36.5 ??C (97.7 ??F)-39.7 ??C (103.5 ??F)] 37.8 ??C (100 ??F)  Heart Rate:  [81-117] 87  SpO2 Pulse:  [81-117] 93  Resp:  [12-28] 18  BP: (86-182)/(52-105) 144/75  MAP (mmHg):  [64-123] 72  SpO2:  [89 %-100 %] 93 %    Physical Exam:      Vitals:    10/08/21 1243   BP: 144/75   Pulse: 87   Resp: 18   Temp: 37.8 ??C (100 ??F)   SpO2: 93%     ASA Grade: ASA 3 - Patient with moderate systemic disease with functional limitations  General: No apparent distress.  Lungs: Breathing comfortably on room air.  Heart:  Regular rate and rhythm.   Neuro: No obvious focal deficits.    Airway assessment: Class 3 - Can visualize soft palate    Diagnostic Studies:  None Relavent    Labs:    Recent Labs     10/07/21  1419 10/08/21  0513   WBC 13.3* 7.7   HGB 12.9 11.6   HCT 40.5 35.4   PLT 325 268     Recent Labs     10/07/21  1419 10/08/21  0513   NA 136 136 K 3.8 3.6   CL 101 102   BUN 7* 6*   CREATININE 0.60 0.58*   GLU 210* 194*     Recent Labs     10/07/21  1419 10/08/21  0513   PROT 8.1 7.0   ALBUMIN 4.3 3.4   AST 41* 50*   ALT 41 40   ALKPHOS 97 75   BILITOT 0.9 0.7     No results for input(s): INR, APTT, FIBRINOGEN in the last 72 hours.    Blood Cultures Pending:  Yes.  Does Anticoagulation need to be held:  No.    Assessment and Recommendations:     [name] is a 50 y.o. female with history of recently diagnosed left-sided colon adenocarcinoma status post sigmoid colectomy and recent right IJ chest port placement 09/29/2021 who presented to [facility]  with fever and emesis and found to be bacteremic.     Recommendations:  - Proceed with port removal.  - Anticipated procedure date: 10/09/2021.  - Please make NPO night prior to procedure  - Please ensure recent CBC, Creatinine, and INR are available    Informed Consent:  This procedure and sedation has been fully reviewed with the patient/patient???s authorized representative. The risks, benefits and alternatives have been explained, and the patient/patient???s authorized representative has consented to the procedure.  --The patient will accept blood products in an emergent situation.  --The patient does not have a Do Not Resuscitate order in effect.      Thank you for involving [place] in the care of this patient. Please page the VIR consult pager [phone]) with further questions, concerns, or if new issues arise.

## 2021-10-08 NOTE — Unmapped (Signed)
Oncology Consult Note    Requesting Attending Physician :  Billy Coast, MD  Service Requesting Consult : Infectious Disease (MDK)  Reason for Consult: CRBSI  Primary Oncologist: Dr. Rozell Searing    Assessment: Nina Mitchell  is a 50 y.o. female with Stage IIIB (T3N1b) L-sided pMMR colon adenocarcinoma s/p open sigmoid colectomy with primary end-end anastamosis planning for adjuvant therapy (CAPOX) with recent port placement on 09/29/21. She is admitted for neck discomfort, fever, malaise found to have MSSA bacteremia. Oncology was consulted for continuity of care.    She unfortunately appears to have developed CRBSI folowing port implantation on 09/29/21. Will discuss removal vs treating through with ID. If removal necessary will need to reestablish central access after clearance of blood cultures. Will hold adjuvant chemotherapy initiation.     Recommendations:   - Appreciate recs from ICID standpoint for central line infection and wether or not we can treat through. Agree with removal if felt necessary/appropriate  - Addendum: will NOT need central access prior to discharge can receive outpatient CAPOX via PIV  - Agree with IV Nafcillin, daily BCx until cleare  - fup TTE  - Will hold upcoming chemotherapy   - Will coordinate outpatient chemotherapy plan with primary team, Dr. Rozell Searing.       This patient has been staffed with Dr. Rosana Hoes These recommendations were discussed with the primary team.     Please contact the oncology consult fellow at [phone] with any further questions.    Malva Cogan, MD  PGY-4, Hematology/Oncology Fellow  [facility]    -------------------------------------------------------------    HPI: Nina Mitchell is a 50 y.o. female and who is being seen at the request of Billy Coast, MD for continuity of care in the setting of upcoming adjuvant chemotherapy and active blood stream infection.    Nina Mitchell was discovered to have sigmoid colon mass in 08/2021 on EGD+Colo obtained for generalized abdominal pain and bowel habit change. She underwent open sigmoid colectomy with primary anastomosis on 09/05/21. Pathology revealing moderately differentiated adenocarcinoma with MP invasion. Negative margins with 3/17 LN positive for cancer; pMMR.     She follows with Dr. Rozell Searing and was planned to start adjuvant CAPOX x4 cycles with tentative C1D1 10/10/21. RIJ chest port was placed on 09/29/21 by VIR Dr. Everardo Beals.     For the past several days, Nina Mitchell has noticed increasing right sided neck discomfort and mild subjective feeling of swelling in the neck. Port site and line tract were mild-moderately tender to the touch. She began experiencing chills, nausea/vomiting and developed fever to Tm 102.37f at home prompting her presentation to the ED. Lives in [place] with husband but drove here to [facility] per patient preference.       Review of Systems: All positive and pertinent negatives are noted in the HPI; a 10 system review of systems was otherwise negative except as noted in HPI.    Oncologic History:  Oncology History   Adenocarcinoma of sigmoid colon (CMS-HCC)   09/18/2021 Initial Diagnosis    Adenocarcinoma of sigmoid colon (CMS-HCC)     09/18/2021 -  Cancer Staged    Staging form: Colon and Rectum, AJCC 8th Edition  - Pathologic stage from 09/18/2021: Stage IIIB (pT3, pN1b, cM0) - Signed by Phil Dopp, MD on 09/18/2021       10/10/2021 - 10/10/2021 Chemotherapy    OP GI CAPECITABINE/OXALIPLATIN (ADJUVANT)  capecitabine 850 mg/m2 PO BID on days 1-14, oxaliplatin 130 mg/m2 IV  on day 1, every 21 days     10/10/2021 -  Chemotherapy    OP GI CAPECITABINE/OXALIPLATIN (ADJUVANT)  capecitabine 850 mg/m2 PO BID on days 1-14, oxaliplatin 130 mg/m2 IV on day 1, every 21 days         Past Medical History:   Diagnosis Date   ??? Adenocarcinoma of sigmoid colon (CMS-HCC) 09/18/2021   ??? Anxiety    ??? Arthritis Jaw and other areas    2009   ??? Depression    ??? Disease of thyroid gland    ??? GERD (gastroesophageal reflux disease)    ??? HTN (hypertension)    ??? Mild intermittent asthma without complication    ??? Obesity    Reviewed in EMR and updated as appropriate     Past Surgical History:   Procedure Laterality Date   ??? IR INSERT PORT AGE GREATER THAN 5 YRS  09/29/2021    IR INSERT PORT AGE GREATER THAN 5 YRS 09/29/2021 Trude Mcburney, MD IMG VIR [facility]   ??? PR HYSTEROSCOPY,W/ENDOMETRIAL ABLATION N/A 04/17/2015    Procedure: HYSTEROSCOPY SURGICAL; W/ENDOMETRIAL ABLATION (EG, ENDOMETRIAL RESECT, ELECTROSURGICAL ABLATION, THERMOABL);  Surgeon: Orma Render, MD;  Location: OR [facility];  Service: Gynecology   ??? SINUS SURGERY     ??? TEMPOROMANDIBULAR JOINT SURGERY     ??? TYMPANOSTOMY TUBE PLACEMENT     Reviewed in EMR and updated as appropriate     Family History   Problem Relation Age of Onset   ??? Cancer Mother    ??? Heart disease Mother    ??? Thyroid disease Mother    ??? Breast cancer Mother 30   ??? Heart disease Father    ??? COPD Maternal Grandmother    ??? Heart disease Maternal Grandmother    Reviewed in EMR and updated as appropriate      Social History     Socioeconomic History   ??? Marital status: Married   ??? Years of education: 20   Tobacco Use   ??? Smoking status: Never   ??? Smokeless tobacco: Never   Substance and Sexual Activity   ??? Alcohol use: Yes     Alcohol/week: 0.0 standard drinks     Types: 1 drink(s) per week     Comment: RARELY   ??? Drug use: No   ??? Sexual activity: Yes     Partners: Male     Birth control/protection: Pill     Social Determinants of Health     Financial Resource Strain: Low Risk    ??? Difficulty of Paying Living Expenses: Not hard at all   Food Insecurity: No Food Insecurity   ??? Worried About Programme researcher, broadcasting/film/video in the Last Year: Never true   ??? Ran Out of Food in the Last Year: Never true   Transportation Needs: No Transportation Needs   ??? Lack of Transportation (Medical): No   ??? Lack of Transportation (Non-Medical): No       Social History     Social History Narrative   ??? Not on file Reviewed in EMR and updated as appropriate     Allergies: is allergic to bupropion, prochlorperazine maleate, sulfa (sulfonamide antibiotics), sulfamethoxazole-trimethoprim, wellbutrin [bupropion hcl], benzonatate, and cefaclor.    Medications:   Meds:  ??? atorvastatin  10 mg Oral Nightly   ??? enoxaparin (LOVENOX) injection  40 mg Subcutaneous Q12H [facility]   ??? escitalopram oxalate  20 mg Oral Daily   ??? famotidine  20 mg Oral Daily   ???  gabapentin  300 mg Oral Nightly   ??? insulin lispro  0-20 Units Subcutaneous ACHS   ??? levothyroxine  25 mcg Oral Daily   ??? nafcillin (NALLPEN) IV  2 g Intravenous Q4H [facility]     Continuous Infusions:  PRN Meds:.acetaminophen, clonazePAM, dextrose in water, glucagon, glucose, ketorolac, ondansetron, tiZANidine    Objective:   Vitals: Temp:  [36.5 ??C (97.7 ??F)-39.7 ??C (103.5 ??F)] 36.5 ??C (97.7 ??F)  Heart Rate:  [81-125] 81  SpO2 Pulse:  [81-117] 103  Resp:  [12-28] 16  BP: (86-182)/(53-105) 182/94  MAP (mmHg):  [64-123] 123  SpO2:  [93 %-100 %] 94 %  No intake/output data recorded.    Physical Exam:  BP 182/94  - Pulse 81  - Temp 36.5 ??C (97.7 ??F) (Oral)  - Resp 16  - SpO2 94%    GEN: well developed, well nourished, in no distress  PSYCH: full and appropriate range of affect with good insight and judgement  HEENT: NCAT, pupils equal, sclerae anicteric, OP clear, tenderness to palpation over right chest wall to right neck along path of central line.   LYMPH: no cervical, supraclavicular adenopathy  PULM: normal resp rate, clear bilaterally without wheezes, rhonchi, rales  COR: rrr, no m/r/g, no LE edema  GI: bowel sounds present, abdomen soft, nontender, nondistended, no palpable hepatosplenomegaly   EXT: warm, flushed, well perfused, no edema  SKIN: No rashes     ECOG Performance Status: 1 - Restricted in physically strenuous activity but ambulatory and able to carry out work of a light or sedentary nature, e.g., light house work, office work    Pharmacist, community  Lab Results   Component Value Date WBC 7.7 10/08/2021    HGB 11.6 10/08/2021    HCT 35.4 10/08/2021    PLT 268 10/08/2021     Lab Results   Component Value Date    NA 136 10/08/2021    K 3.6 10/08/2021    CL 102 10/08/2021    CO2 24.0 10/08/2021    BUN 6 (L) 10/08/2021    CREATININE 0.58 (L) 10/08/2021    GLU 194 (H) 10/08/2021    CALCIUM 8.4 (L) 10/08/2021     Lab Results   Component Value Date    BILITOT 0.7 10/08/2021    PROT 7.0 10/08/2021    ALBUMIN 3.4 10/08/2021    ALT 40 10/08/2021    AST 50 (H) 10/08/2021    ALKPHOS 75 10/08/2021     No results found for: LABPROT, INR, APTT    Imaging: Radiology studies were personally reviewed

## 2021-10-08 NOTE — Unmapped (Signed)
Bed: 22-B  Expected date: 10/08/21  Expected time: 1:30 AM  Means of arrival:   Comments:

## 2021-10-08 NOTE — Unmapped (Signed)
Infectious Disease (MEDK) History & Physical    Assessment & Plan:   Nina Mitchell is a 50 y.o. female with PMHx of HTN, T2DM, HLD, GERD, hypothyroidism, depression/anxiety, migraines, and recent diagnosis of stage IIIb ([id number]) Mitchell-sided colon adenocarcinoma (October 2022) s/p open sigmoid colectomy (09/05/21) and recent RIJ chest port placement (09/29/21) who presented to [facility] ED on 11/15 due to fevers and emesis.     Principal Problem:    Fever of undetermined origin  Active Problems:    Mild intermittent asthma without complication    Gastroesophageal reflux disease without esophagitis    Situational mixed anxiety and depressive disorder    Intractable migraine without aura and without status migrainosus    Disease of thyroid gland    Obstructive sleep apnea syndrome    Depression    Acute bilateral low back pain without sciatica    HTN (hypertension)    Anxiety    Adenocarcinoma of sigmoid colon (CMS-HCC)  Resolved Problems:    * No resolved hospital problems. *    Fever on unknown origin-Possible port infection  The day prior to presentation, the patient noticed some discomfort/stress on the right side of her neck.  The morning of presentation, she noticed some redness and tenderness around her port site and felt feverish, chills, and body aches, so she took her temperature at home which was 102.25F.  After eating breakfast, she had 4-5 episodes of NBNB emesis for which she took Zofran 8 mg ODT.  Remains febrile, mildly hypertensive.  Labs were notable for leukocytosis 13.3 (ANC elevated at 11.9, ALC low at 0.7), largely unremarkable CMP, lactate 1.7, negative RPP, and UA with proteinuria, glucosuria, ketonuria, and 12 WBC but no bacteria.  CXR and ECG were both unremarkable. Bedside POCUS showed no discrete fluid collection to the line of the port. Blood cultures were gone, she was started on sepsis fluids, and cefepime and vancomycin.  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 09/05/21 open sigmoid colectomy with primary anastomosis with pathology revealing moderately differentiated adenocarcinoma. Based on the results, patient was supposed to start Capecitabine but given her recent symptoms was not able to.   -[facility] oncology in AM       Chronic Problems:   T2DM: Atorvastatin 10 mg, SSI   Anxiety-Depression: Clonazepam 0.5 mg daily PRN, escitalopram oxalate 20 mg daily  GERD: Famotidine 20 daily   Back pain: Gabapentin 300 nightly, tizanidine 4 mg PRN   HTN: Holding BP meds for now given the possible infection, will follow up in AM  Hypothyroidism: Synthroid 25 mcg  PO daily     Daily Checklist:  Diet: Regular Diet  DVT PPx: Lovenox 40mg  q24h  Electrolytes: Replete Potassium to >/= 3.6 and Magnesium to >/= 1.8  Code Status: Full Code    Chief Concern:   Fever of undetermined origin    Subjective:   HPI:  Nina Mitchell is a 50 y.o. female with PMHx of HTN, T2DM, HLD, GERD, hypothyroidism, depression/anxiety, migraines, and recent diagnosis of stage IIIb ([id number]) Mitchell-sided colon adenocarcinoma (October 2022) s/p open sigmoid colectomy (09/05/21) and recent RIJ chest port placement (09/29/21) who  presented to [facility] ED on 11/15 due to fevers and emesis.    The day prior to presentation, the patient noticed some discomfort/stress on the right side of her neck.  The morning of presentation, she noticed some redness and tenderness around her port site and felt feverish, chills, and body aches, so she took her temperature at home which was 102.85F.  After eating breakfast, she had 4-5 episodes of NBNB emesis for which she took Zofran 8 mg ODT.  She called the VIR nurse who instructed her to go to the ED.    After her recent surgeries and port placement but prior to today, she was previously tolerating PO with return to normal bowel movements.  She has had some mild postnasal drip and a occasional mild nonproductive cough over the past couple days.  She denies severe headaches, chest pain, dyspnea, abdominal pain, melena, hematochezia, diarrhea, constipation, new rashes or skin lesions, no history of recurrent skin infections or psoriasis, or change in her chronic back pain.  She has had no accidental dislodgment of her port or its dressing.    She does report some vaginal pruritus and irritation and dysuria that was attributed to recently starting semaglutide.  This medication was discontinued, and she took a dose of fluconazole the day prior to presentation and has not had any of these symptoms since.    Patient has not yet started chemotherapy.  Her oral chemotherapy medication arrived on the day of presentation, but she has not started taking it.  She had her flu shot a couple weeks ago and her 2nd Covid booster last Tuesday.    In the ED, the patient was febrile to 39.3C, tachycardic to 125, mildly hypertensive to 155/88, and satting well on room air.  Labs were notable for leukocytosis 13.3 (ANC elevated at 11.9, ALC low at 0.7), largely unremarkable CMP, lactate 1.7, negative RPP, and UA with proteinuria, glucosuria, ketonuria, and 12 WBC but no bacteria.  CXR and ECG were both unremarkable.  Blood cultures were gone, she was started on sepsis fluids, and cefepime and vancomycin.    Designated Healthcare Decision Maker:  [name] currently has decisional capacity for healthcare decision-making and is able to designate a surrogate healthcare decision maker. [name] designated healthcare decision maker(s) is/are Betsey Holiday (the patient's spouse) as denoted by stated patient preference.    Allergies:  Bupropion, Prochlorperazine maleate, Sulfa (sulfonamide antibiotics), Sulfamethoxazole-trimethoprim, Wellbutrin [bupropion hcl], Benzonatate, and Cefaclor    Medications:   Prior to Admission medications    Medication Dose, Route, Frequency   ACCU-CHEK GUIDE ME GLUCOSE MTR Misc Use as directed.   ACCU-CHEK GUIDE TEST STRIPS Strp TEST ONCE DAILY   ACCU-CHEK SOFTCLIX LANCETS lancets TEST ONCE DAILY   acetaminophen (TYLENOL) 500 MG tablet 500 mg, Oral, Every 12 hours PRN   atenolol (TENORMIN) 25 MG tablet 1 mg, Oral, Nightly   clonazePAM (KLONOPIN) 0.5 MG tablet 0.5 mg, Daily PRN   docusate sodium (COLACE) 100 MG capsule 100 mg, Oral, Nightly PRN   ESCITALOPRAM OXALATE ORAL 20 mg, Daily   famotidine (PEPCID) 20 MG tablet 20 mg, Oral, Daily   gabapentin (NEURONTIN) 300 MG capsule 300 mg, Daily   levothyroxine (SYNTHROID) 25 MCG tablet 25 mcg, Oral, Daily (standard)   lisinopriL (PRINIVIL,ZESTRIL) 2.5 MG tablet 2.5 mg, Daily   multivitamin with minerals tablet 1 tablet, Oral, Daily (standard)   onabotulinumtoxinA (BOTOX) 200 unit SolR injection 200 Units, Intradermal, Every 3 months   ondansetron (ZOFRAN-ODT) 8 MG disintegrating  tablet Take 1 tablet by mouth twice daily on days 2 and 3 of chemotherapy cycles. May take up to 1 tablet every 8 hours as needed (but do not take on day 1 of chemotherapy cycles).   oxyCODONE-acetaminophen (PERCOCET) 10-325 mg per tablet 1 tablet, Oral, Every 6 hours PRN   rizatriptan (MAXALT-MLT) 10 MG disintegrating tablet 10 mg, Oral, Daily PRN   rosuvastatin (CRESTOR) 5 MG tablet 5 mg, Daily   tiZANidine (ZANAFLEX) 4 MG tablet 4 mg, Oral, Every 8 hours PRN   capecitabine (XELODA) 150 MG tablet 300 mg, Oral, 2 times a day (standard), Take Days 1 through 14, then 1 week off.   capecitabine (XELODA) 500 MG tablet 2,000 mg, Oral, 2 times a day (standard), Take Days 1 through 14, then 1 week off.   loperamide (IMODIUM) 2 mg capsule If having diarrhea, take 2 capsules (4 mg) after the first loose stool, then 1 capsule every 2 hours until diarrhea free for 12 hours.   tirzepatide 2.5 mg/0.5 mL PnIj 2.5 mg, Subcutaneous, Every 7 days, For 4 weeks.       Medical History:  Past Medical History:   Diagnosis Date   ??? Adenocarcinoma of sigmoid colon (CMS-HCC) 09/18/2021   ??? Anxiety    ??? Arthritis Jaw and other areas    2009   ??? Depression    ??? Disease of thyroid gland    ??? GERD (gastroesophageal reflux disease)    ??? HTN (hypertension)    ??? Mild intermittent asthma without complication    ??? Obesity        Surgical History:  Past Surgical History:   Procedure Laterality Date   ??? IR INSERT PORT AGE GREATER THAN 5 YRS  09/29/2021    IR INSERT PORT AGE GREATER THAN 5 YRS 09/29/2021 Trude Mcburney, MD IMG [facility]   ??? PR HYSTEROSCOPY,W/ENDOMETRIAL ABLATION N/A 04/17/2015    Procedure: HYSTEROSCOPY SURGICAL; W/ENDOMETRIAL ABLATION (EG, ENDOMETRIAL RESECT, ELECTROSURGICAL ABLATION, THERMOABL);  Surgeon: Orma Render, MD;  Location: OR [facility];  Service: Gynecology   ??? SINUS SURGERY     ??? TEMPOROMANDIBULAR JOINT SURGERY     ??? TYMPANOSTOMY TUBE PLACEMENT         Family History:   Family History   Problem Relation Age of Onset   ??? Cancer Mother    ??? Heart disease Mother    ??? Thyroid disease Mother    ??? Breast cancer Mother 43   ??? Heart disease Father    ??? COPD Maternal Grandmother    ??? Heart disease Maternal Grandmother        Social History:  The patient Lives with Spouse     Social History     Tobacco Use   ??? Smoking status: Never   ??? Smokeless tobacco: Never   Substance Use Topics   ??? Alcohol use: Yes     Alcohol/week: 0.0 standard drinks     Types: 1 drink(s) per week     Comment: RARELY   ??? Drug use: No        Review of Systems:  10 systems were reviewed and are negative unless otherwise mentioned in the HPI    Objective:   Physical Exam:  Temp:  [39.2 ??C (102.6 ??F)-39.3 ??C (102.7 ??F)] 39.2 ??C (102.6 ??F)  Heart Rate:  [110-125] 110  SpO2 Pulse:  [108-117] 109  Resp:  [12-22] 12  BP: (134-155)/(72-92) 145/84  SpO2:  [93 %-96 %] 95 %    Gen:in NAD,  answers questions appropriately, appears lethargic   Eyes: Sclera anicteric, EOMI, PERRLA,   HENT: atraumatic, normocephalic, MMM. OP w/o erythema or exudate   Neck: no cervical lymphadenopathy or thyromegaly, no JVD  Heart: RRR, S1, S2, no M/R/G, no chest wall tenderness  Lungs: CTAB, no crackles or wheezes, no use of accessory muscles  Abdomen: Normoactive bowel sounds, soft, NTND, no rebound/guarding, no hepatosplenomegaly  Extremities: no clubbing, cyanosis, or edema: pulses are +2 in bilateral upper and lower extremities  Neuro: No focal deficits.  Skin:  No rashes, lesions on clothed exam  Psych: Alert, oriented, normal mood and affect.     Labs/Studies/Imaging:  Labs, Studies, Imaging from the last 24hrs per EMR and personally reviewed    Yehuda Savannah, MD   PGY1, Internal Medicine

## 2021-10-08 NOTE — Unmapped (Signed)
Patient returned call. She is presently in ED. Has infection related to port placement. Plan is to be admitted once there is a bed available. When she went to work yesterday, she started shaking, had vomiting, reports fever of 103 degrees. Was instructed to present to ED which she did. She is comfortable with the plan of care and states that everyone has been very kind. Advised patient that team has been very communicative with Dr. Rozell Searing. Appointments/treatment have been shifted to 12/2 and patient is aware. Patient expressed appreciation for the call.

## 2021-10-08 NOTE — Unmapped (Signed)
Vancomycin Therapeutic Monitoring Pharmacy Note    Nina Mitchell is a 50 y.o. female starting vancomycin. Date of therapy initiation: 10/07/21    Indication: Suspected infection     Prior Dosing Information: None/new initiation     Goals:  Therapeutic Drug Levels  Vancomycin trough goal: 10-15 mg/L    Additional Clinical Monitoring/Outcomes  Renal function, volume status (intake and output)    Results: Not applicable    Wt Readings from Last 1 Encounters:   09/19/21 (!) 126 kg (277 lb 12.8 oz)     Creatinine Whole Blood, POC   Date Value Ref Range Status   09/29/2021 0.4 (L) 0.7 - 1.1 mg/dL Final     Creatinine   Date Value Ref Range Status   10/07/2021 0.60 0.60 - 0.80 mg/dL Final   16/08/9603 5.40 0.50 - 1.10 mg/dL Final        Pharmacokinetic Considerations and Significant Drug Interactions:  Adult (calculated on 11/15): Vd = 89.46 L, ke = 0.104 hr-1  Concurrent nephrotoxic meds: not applicable    Assessment/Plan:  Recommendation(s)  Received vancomycin 2500 mg load in ED, start 2000 mg IV q12h  Estimated trough on recommended regimen:  10 mg/L    Follow-up  Level due: prior to fourth or fifth dose  A pharmacist will continue to monitor and order levels as appropriate    Please page service pharmacist with questions/clarifications.    Durenda Age, PharmD Candidate

## 2021-10-08 NOTE — Telephone Encounter (Signed)
Submitted PA on CMM. Key: [id number]. Waiting on determination from [place].

## 2021-10-09 LAB — CBC W/ AUTO DIFF
BASOPHILS ABSOLUTE COUNT: 0 10*9/L (ref 0.0–0.1)
BASOPHILS RELATIVE PERCENT: 1 %
EOSINOPHILS ABSOLUTE COUNT: 0 10*9/L (ref 0.0–0.5)
EOSINOPHILS RELATIVE PERCENT: 1.2 %
HEMATOCRIT: 33.8 % — ABNORMAL LOW (ref 34.0–44.0)
HEMOGLOBIN: 11 g/dL — ABNORMAL LOW (ref 11.3–14.9)
LYMPHOCYTES ABSOLUTE COUNT: 0.9 10*9/L — ABNORMAL LOW (ref 1.1–3.6)
LYMPHOCYTES RELATIVE PERCENT: 24.7 %
MEAN CORPUSCULAR HEMOGLOBIN CONC: 32.5 g/dL (ref 32.0–36.0)
MEAN CORPUSCULAR HEMOGLOBIN: 25.9 pg (ref 25.9–32.4)
MEAN CORPUSCULAR VOLUME: 79.7 fL (ref 77.6–95.7)
MEAN PLATELET VOLUME: 7.5 fL (ref 6.8–10.7)
MONOCYTES ABSOLUTE COUNT: 0.3 10*9/L (ref 0.3–0.8)
MONOCYTES RELATIVE PERCENT: 8.4 %
NEUTROPHILS ABSOLUTE COUNT: 2.3 10*9/L (ref 1.8–7.8)
NEUTROPHILS RELATIVE PERCENT: 64.7 %
PLATELET COUNT: 204 10*9/L (ref 150–450)
RED BLOOD CELL COUNT: 4.24 10*12/L (ref 3.95–5.13)
RED CELL DISTRIBUTION WIDTH: 16.4 % — ABNORMAL HIGH (ref 12.2–15.2)
WBC ADJUSTED: 3.5 10*9/L — ABNORMAL LOW (ref 3.6–11.2)

## 2021-10-09 LAB — COMPREHENSIVE METABOLIC PANEL
ALBUMIN: 3.1 g/dL — ABNORMAL LOW (ref 3.4–5.0)
ALKALINE PHOSPHATASE: 76 U/L (ref 46–116)
ALT (SGPT): 98 U/L — ABNORMAL HIGH (ref 10–49)
ANION GAP: 11 mmol/L (ref 5–14)
AST (SGOT): 120 U/L — ABNORMAL HIGH (ref <=34)
BILIRUBIN TOTAL: 0.6 mg/dL (ref 0.3–1.2)
BLOOD UREA NITROGEN: 6 mg/dL — ABNORMAL LOW (ref 9–23)
BUN / CREAT RATIO: 10
CALCIUM: 8.2 mg/dL — ABNORMAL LOW (ref 8.7–10.4)
CHLORIDE: 104 mmol/L (ref 98–107)
CO2: 25 mmol/L (ref 20.0–31.0)
CREATININE: 0.61 mg/dL
EGFR CKD-EPI (2021) FEMALE: >90 mL/min/{1.73_m2} (ref >=60)
GLUCOSE RANDOM: 192 mg/dL — ABNORMAL HIGH (ref 70–179)
POTASSIUM: 2.9 mmol/L — ABNORMAL LOW (ref 3.4–4.8)
PROTEIN TOTAL: 6.5 g/dL (ref 5.7–8.2)
SODIUM: 140 mmol/L (ref 135–145)

## 2021-10-09 LAB — PROTIME-INR
INR: 1.18
PROTIME: 13.4 s — ABNORMAL HIGH (ref 9.8–12.8)

## 2021-10-09 MED ORDER — LOPERAMIDE 2 MG CAPSULE
ORAL_CAPSULE | 11 refills | 0 days | Status: CP
Start: 2021-10-09 — End: ?

## 2021-10-09 MED ORDER — CAPECITABINE 500 MG TABLET
ORAL_TABLET | Freq: Two times a day (BID) | ORAL | 3 refills | 14 days | Status: CP
Start: 2021-10-09 — End: ?

## 2021-10-09 MED ORDER — ONDANSETRON 8 MG DISINTEGRATING TABLET
ORAL_TABLET | 2 refills | 0 days | Status: CP
Start: 2021-10-09 — End: ?

## 2021-10-09 MED ORDER — CAPECITABINE 150 MG TABLET
ORAL_TABLET | Freq: Two times a day (BID) | ORAL | 3 refills | 14 days | Status: CP
Start: 2021-10-09 — End: ?
  Filled 2021-11-20: qty 56, 21d supply, fill #1

## 2021-10-09 MED ADMIN — atorvastatin (LIPITOR) tablet 10 mg: 10 mg | ORAL | @ 02:00:00

## 2021-10-09 MED ADMIN — nafcillin (NALLPEN) 2 g in sodium chloride 0.9 % (NS) 100 mL IVPB-connector bag: 2 g | INTRAVENOUS | @ 20:00:00 | Stop: 2021-10-13

## 2021-10-09 MED ADMIN — acetaminophen (TYLENOL) tablet 650 mg: 650 mg | ORAL | @ 11:00:00

## 2021-10-09 MED ADMIN — nafcillin (NALLPEN) 2 g in sodium chloride 0.9 % (NS) 100 mL IVPB-connector bag: 2 g | INTRAVENOUS | @ 10:00:00 | Stop: 2021-10-13

## 2021-10-09 MED ADMIN — tiZANidine (ZANAFLEX) tablet 4 mg: 4 mg | ORAL | @ 02:00:00

## 2021-10-09 MED ADMIN — enoxaparin (LOVENOX) syringe 40 mg: 40 mg | SUBCUTANEOUS | @ 02:00:00

## 2021-10-09 MED ADMIN — rizatriptan (MAXALT) tablet 10 mg: 10 mg | ORAL

## 2021-10-09 MED ADMIN — nafcillin (NALLPEN) 2 g in sodium chloride 0.9 % (NS) 100 mL IVPB-connector bag: 2 g | INTRAVENOUS | @ 15:00:00 | Stop: 2021-10-13

## 2021-10-09 MED ADMIN — enoxaparin (LOVENOX) syringe 40 mg: 40 mg | SUBCUTANEOUS | @ 13:00:00

## 2021-10-09 MED ADMIN — insulin lispro (HumaLOG) injection 0-20 Units: 0-20 [IU] | SUBCUTANEOUS | @ 23:00:00

## 2021-10-09 MED ADMIN — ondansetron (ZOFRAN) injection 4 mg: 4 mg | INTRAVENOUS | @ 02:00:00

## 2021-10-09 MED ADMIN — escitalopram oxalate (LEXAPRO) tablet 20 mg: 20 mg | ORAL | @ 13:00:00

## 2021-10-09 MED ADMIN — nafcillin (NALLPEN) 2 g in sodium chloride 0.9 % (NS) 100 mL IVPB-connector bag: 2 g | INTRAVENOUS | @ 08:00:00 | Stop: 2021-10-13

## 2021-10-09 MED ADMIN — clonazePAM (KlonoPIN) tablet 0.5 mg: .5 mg | ORAL | @ 22:00:00

## 2021-10-09 MED ADMIN — ondansetron (ZOFRAN) injection 4 mg: 4 mg | INTRAVENOUS

## 2021-10-09 MED ADMIN — levothyroxine (SYNTHROID) tablet 25 mcg: 25 ug | ORAL | @ 10:00:00

## 2021-10-09 MED ADMIN — nafcillin (NALLPEN) 2 g in sodium chloride 0.9 % (NS) 100 mL IVPB-connector bag: 2 g | INTRAVENOUS | @ 03:00:00 | Stop: 2021-10-13

## 2021-10-09 MED ADMIN — insulin lispro (HumaLOG) injection 0-20 Units: 0-20 [IU] | SUBCUTANEOUS | @ 04:00:00

## 2021-10-09 MED ADMIN — gabapentin (NEURONTIN) capsule 300 mg: 300 mg | ORAL | @ 02:00:00

## 2021-10-09 MED ADMIN — potassium chloride (KLOR-CON) CR tablet 40 mEq: 40 meq | ORAL | @ 17:00:00 | Stop: 2021-10-09

## 2021-10-09 MED ADMIN — ondansetron (ZOFRAN) injection 4 mg: 4 mg | INTRAVENOUS | @ 11:00:00

## 2021-10-09 MED ADMIN — famotidine (PEPCID) tablet 20 mg: 20 mg | ORAL | @ 13:00:00

## 2021-10-09 MED ADMIN — nafcillin (NALLPEN) 2 g in sodium chloride 0.9 % (NS) 100 mL IVPB-connector bag: 2 g | INTRAVENOUS | @ 23:00:00 | Stop: 2021-10-13

## 2021-10-09 MED ADMIN — insulin lispro (HumaLOG) injection 0-20 Units: 0-20 [IU] | SUBCUTANEOUS | @ 13:00:00

## 2021-10-09 MED ADMIN — acetaminophen (TYLENOL) tablet 650 mg: 650 mg | ORAL | @ 01:00:00

## 2021-10-09 NOTE — Unmapped (Signed)
Care Management  Initial Transition Planning Assessment              General  Care Manager assessed the patient by : In person interview with patient, In person interview with family, Medical record review, Discussion with Clinical Care team  Orientation Level: Oriented X4  Functional level prior to admission: Independent  Reason for referral: Discharge Planning    Contact/Decision [facility]  Extended Emergency Contact Information  Primary Emergency Contact: Nina Mitchell  Address: [address]           [place], [place] [address] [place]  Home Phone: [phone]  Mobile Phone: [phone]  Relation: Spouse  Secondary Emergency Contact: Leonor Liv  Address: [address]           [place], [place] [address] [place]  Home Phone: [phone]  Relation: Brother    Armed forces operational officer Next of Kin / Guardian / POA / Advance Directives     HCDM (patient stated preference): Nina Mitchell - Spouse - [phone]    Advance Directive (Medical Treatment)  Does patient have an advance directive covering medical treatment?: Patient does not have advance directive covering medical treatment.  Reason patient does not have an advance directive covering medical treatment:: Patient does not wish to complete one at this time.    Health Care Decision Maker [HCDM] (Medical & Mental Health Treatment)  Healthcare Decision Maker: HCDM documented in the HCDM/Contact Info section.  Information offered on HCDM, Medical & Mental Health advance directives:: Patient declined information.         Readmission Information    Have you been hospitalized in the last 30 days?: No                                Did the following happen with your discharge?                                                     Patient Information  Lives with: Spouse/significant other    Type of Residence: Private residence        Location/Detail: [address] [place] [address]    Support Systems/Concerns: Spouse    Responsibilities/Dependents at home?: No    Home Care services in place prior to admission?: No                  Equipment Currently Used at Home: none       Currently receiving outpatient dialysis?: No       Financial Information       Need for financial assistance?: No       Social Determinants of Health  Social Determinants of Health were addressed in provider documentation.  Please refer to patient history.    Complex Discharge Information    Is patient identified as a difficult/complex discharge?: No                                                               Interventions:       Discharge Needs Assessment  Concerns to be Addressed: discharge  planning    Clinical Risk Factors: Multiple Diagnoses (Chronic), Principal Diagnosis: Cancer, Stroke, COPD, Heart Failure, AMI, Pneumonia, Joint Replacment    Barriers to taking medications: No    Prior overnight hospital stay or ED visit in last 90 days: No              Anticipated Changes Related to Illness: none    Equipment Needed After Discharge: none    Discharge Facility/Level of Care Needs: other (see comments) (Anticipate home with home infusion)    Readmission  Risk of Unplanned Readmission Score: UNPLANNED READMISSION SCORE: 12.22%  Predictive Model Details          12% (Medium)  Factor Value    Calculated 10/09/2021 12:04 24% Number of active Rx orders 39    [place] Risk of Unplanned Readmission Model 18% Active NSAID Rx order present     9% Diagnosis of cancer present     8% ECG/EKG order present in last 6 months     8% Latest calcium low (8.2 mg/dL)     6% Imaging order present in last 6 months     5% Latest hemoglobin low (11.0 g/dL)     5% Number of ED visits in last six months 1     4% Charlson Comorbidity Index 4     4% Active anticoagulant Rx order present     4% Age 41     2% Future appointment scheduled     2% Current length of stay 1.785 days     1% Active ulcer medication Rx order present      Readmitted Within the Last 30 Days? (No if blank)   Patient at risk for readmission?: Yes    Discharge Plan  Screen findings are: Discharge planning needs identified or anticipated (Comment).    Expected Discharge Date: 10/13/2021    Expected Transfer from Critical Care:         Patient and/or family were provided with choice of facilities / services that are available and appropriate to meet post hospital care needs?: Yes   List choices in order highest to lowest preferred, if applicable. : No preferance    Initial Assessment complete?: Yes

## 2021-10-09 NOTE — Unmapped (Signed)
Pt is stable. Pt oriented to unit, indicated understanding regarding unit education and admission. Pt ordered food. No complaints of nausea or pain at this time. No concerns at this time, will continue to monitor.    Problem: Adult Inpatient Plan of Care  Goal: Plan of Care Review  Outcome: Ongoing - Unchanged  Flowsheets (Taken 10/08/2021 1830)  Progress: no change  Plan of Care Reviewed With: patient     Problem: Adult Inpatient Plan of Care  Goal: Patient-Specific Goal (Individualized)  Outcome: Ongoing - Unchanged  Flowsheets (Taken 10/08/2021 1830)  Patient-Specific Goals (Include Timeframe): Pt will remain free from falls/injuries this shift  Individualized Care Needs: IV abx, glucose monitoring  Anxieties, Fears or Concerns: none expressed

## 2021-10-09 NOTE — Unmapped (Signed)
Oncology Consult Note    Requesting Attending Physician :  Billy Coast, MD  Service Requesting Consult : Infectious Disease ([facility])  Reason for Consult: CRBSI  Primary Oncologist: Dr. Rozell Searing    Assessment: Nina [facility]  is a 50 y.o. female with Stage IIIB (T3N1b) L-sided pMMR colon adenocarcinoma s/p open sigmoid colectomy with primary end-end anastamosis planning for adjuvant therapy (CAPOX) with recent port placement on 09/29/21. She is admitted for neck discomfort, fever, malaise found to have MSSA bacteremia. Oncology was consulted for continuity of care.    She unfortunately appears to have developed CRBSI folowing port implantation on 09/29/21, now s/p port removal. Will hold adjuvant chemotherapy initiation while inpatient and will start as outpatient via peripheral access.    Recommendations:     - Agree with IV Nafcillin, daily BCx until cleared  - Will defer further infectious workup to primary team  - Will hold upcoming chemotherapy and coordinate outpatient chemotherapy plan with primary team, Dr. Rozell Searing. Will NOT need central access prior to discharge can receive outpatient CAPOX via PIV    This patient has been staffed with Dr. Rosana Hoes These recommendations were discussed with the primary team.     Will sign off at this time.    Please contact the oncology consult fellow at [phone] with any further questions.    Mary Sella. Romari Gasparro, MD, PhD  Internal Medicine - PGY-1  Pager: [phone]      -------------------------------------------------------------    HPI: Nina [facility] is a 50 y.o. female and who is being seen at the request of Billy Coast, MD for continuity of care in the setting of upcoming adjuvant chemotherapy and active blood stream infection.    Nina [facility] was discovered to have sigmoid colon mass in 08/2021 on EGD+Colo obtained for generalized abdominal pain and bowel habit change. She underwent open sigmoid colectomy with primary anastomosis on 09/05/21. Pathology revealing moderately differentiated adenocarcinoma with MP invasion. Negative margins with 3/17 LN positive for cancer; pMMR.     She follows with Dr. Rozell Searing and was planned to start adjuvant CAPOX x4 cycles with tentative C1D1 10/10/21. RIJ chest port was placed on 09/29/21 by VIR Dr. Everardo Beals.     For the past several days, [facility] has noticed increasing right sided neck discomfort and mild subjective feeling of swelling in the neck. Port site and line tract were mild-moderately tender to the touch. She began experiencing chills, nausea/vomiting and developed fever to Tm 102.29f at home prompting her presentation to the ED. Lives in [place] with husband but drove here to [facility] per patient preference.     Interval History: Seen at bedside today. Continues to endorse n/v managed with IV zofran and subjective fevers, improved from presentation. She remains intermittently febrile to 38.4 C. She is glad that port was removed and would not like replacement of durable access to avoid future complications. Discussed concerns and plan for holding of chemotherapy until the outpatient setting. Confirmed that PIV access will be adequate for upcoming chemotherapy, and she endorsed understanding of this plan.    Review of Systems: All positive and pertinent negatives are noted in the HPI; a 10 system review of systems was otherwise negative except as noted in HPI.    Oncologic History:  Oncology History   Adenocarcinoma of sigmoid colon (CMS-HCC)   09/18/2021 Initial Diagnosis    Adenocarcinoma of sigmoid colon (CMS-HCC)     09/18/2021 -  Cancer Staged    Staging form: Colon and Rectum, AJCC 8th  Edition  - Pathologic stage from 09/18/2021: Stage IIIB (pT3, pN1b, cM0) - Signed by Phil Dopp, MD on 09/18/2021       10/10/2021 - 10/10/2021 Chemotherapy    OP GI CAPECITABINE/OXALIPLATIN (ADJUVANT)  capecitabine 850 mg/m2 PO BID on days 1-14, oxaliplatin 130 mg/m2 IV on day 1, every 21 days     10/10/2021 -  Chemotherapy    OP GI CAPECITABINE/OXALIPLATIN (ADJUVANT)  capecitabine 850 mg/m2 PO BID on days 1-14, oxaliplatin 130 mg/m2 IV on day 1, every 21 days         Past Medical History:   Diagnosis Date   ??? Adenocarcinoma of sigmoid colon (CMS-HCC) 09/18/2021   ??? Anxiety    ??? Arthritis Jaw and other areas    2009   ??? Depression    ??? Disease of thyroid gland    ??? GERD (gastroesophageal reflux disease)    ??? HTN (hypertension)    ??? Mild intermittent asthma without complication    ??? Obesity    Reviewed in EMR and updated as appropriate     Past Surgical History:   Procedure Laterality Date   ??? IR INSERT PORT AGE GREATER THAN 5 YRS  09/29/2021    IR INSERT PORT AGE GREATER THAN 5 YRS 09/29/2021 Trude Mcburney, MD IMG VIR [facility]BR   ??? PR HYSTEROSCOPY,W/ENDOMETRIAL ABLATION N/A 04/17/2015    Procedure: HYSTEROSCOPY SURGICAL; W/ENDOMETRIAL ABLATION (EG, ENDOMETRIAL RESECT, ELECTROSURGICAL ABLATION, THERMOABL);  Surgeon: Orma Render, MD;  Location: OR [facility];  Service: Gynecology   ??? SINUS SURGERY     ??? TEMPOROMANDIBULAR JOINT SURGERY     ??? TYMPANOSTOMY TUBE PLACEMENT     Reviewed in EMR and updated as appropriate     Family History   Problem Relation Age of Onset   ??? Cancer Mother    ??? Heart disease Mother    ??? Thyroid disease Mother    ??? Breast cancer Mother 11   ??? Heart disease Father    ??? COPD Maternal Grandmother    ??? Heart disease Maternal Grandmother    Reviewed in EMR and updated as appropriate      Social History     Socioeconomic History   ??? Marital status: Married   ??? Years of education: 20   Tobacco Use   ??? Smoking status: Never   ??? Smokeless tobacco: Never   Substance and Sexual Activity   ??? Alcohol use: Yes     Alcohol/week: 0.0 standard drinks     Types: 1 drink(s) per week     Comment: RARELY   ??? Drug use: No   ??? Sexual activity: Yes     Partners: Male     Birth control/protection: Pill     Social Determinants of Health     Financial Resource Strain: Low Risk    ??? Difficulty of Paying Living Expenses: Not hard at all   Food Insecurity: No Food Insecurity   ??? Worried About Programme researcher, broadcasting/film/video in the Last Year: Never true   ??? Ran Out of Food in the Last Year: Never true   Transportation Needs: No Transportation Needs   ??? Lack of Transportation (Medical): No   ??? Lack of Transportation (Non-Medical): No       Social History     Social History Narrative   ??? Not on file   Reviewed in EMR and updated as appropriate     Allergies: is allergic to bupropion, prochlorperazine maleate, sulfa (sulfonamide antibiotics), sulfamethoxazole-trimethoprim, wellbutrin [bupropion  hcl], benzonatate, and cefaclor.    Medications:   Meds:  ??? atorvastatin  10 mg Oral Nightly   ??? enoxaparin (LOVENOX) injection  40 mg Subcutaneous Q12H [facility]   ??? escitalopram oxalate  20 mg Oral Daily   ??? famotidine  20 mg Oral Daily   ??? gabapentin  300 mg Oral Nightly   ??? insulin lispro  0-20 Units Subcutaneous ACHS   ??? levothyroxine  25 mcg Oral Daily   ??? nafcillin (NALLPEN) IV  2 g Intravenous Q4H [facility]     Continuous Infusions:  PRN Meds:.acetaminophen, clonazePAM, dextrose in water, glucagon, glucose, ketorolac, ondansetron, tiZANidine    Objective:   Vitals: Temp:  [37.1 ??C (98.8 ??F)-38.5 ??C (101.3 ??F)] 38.4 ??C (101.1 ??F)  Heart Rate:  [70-87] 86  SpO2 Pulse:  [93-94] 93  Resp:  [18-27] 22  BP: (110-158)/(52-83) 135/71  MAP (mmHg):  [72-107] 86  SpO2:  [89 %-100 %] 97 %  No intake/output data recorded.    Physical Exam:  BP 135/71  - Pulse 86  - Temp (!) 38.4 ??C (101.1 ??F) (Oral)  - Resp 22  - SpO2 97%    GEN: well developed, well nourished, in no distress  PSYCH: full and appropriate range of affect with good insight and judgement  HEENT: NCAT, pupils equal, sclerae anicteric  PULM: normal resp rate with no increased WOB on RA, no accessory muscle use  COR: rrr, no m/r/g, no LE edema  EXT: warm, flushed, well perfused, no edema  SKIN: No rashes on clothed exam    ECOG Performance Status: 1 - Restricted in physically strenuous activity but ambulatory and able to carry out work of a light or sedentary nature, e.g., light house work, office work    Pharmacist, community  Lab Results   Component Value Date    WBC 3.5 (L) 10/09/2021    HGB 11.0 (L) 10/09/2021    HCT 33.8 (L) 10/09/2021    PLT 204 10/09/2021     Lab Results   Component Value Date    NA 136 10/08/2021    K 3.6 10/08/2021    CL 102 10/08/2021    CO2 24.0 10/08/2021    BUN 6 (L) 10/08/2021    CREATININE 0.58 (L) 10/08/2021    GLU 194 (H) 10/08/2021    CALCIUM 8.4 (L) 10/08/2021     Lab Results   Component Value Date    BILITOT 0.7 10/08/2021    PROT 7.0 10/08/2021    ALBUMIN 3.4 10/08/2021    ALT 40 10/08/2021    AST 50 (H) 10/08/2021    ALKPHOS 75 10/08/2021     Lab Results   Component Value Date    INR 1.18 10/09/2021       Imaging: Radiology studies were personally reviewed

## 2021-10-09 NOTE — Unmapped (Signed)
Infectious Disease ([facility]) Progress Note    Assessment & Plan:   Nina Mitchell is a 50 y.o. female with a PMHx of HTN, T2DM, HLD, GERD, hypothyroidism, depression/anxiety, migraines, and recent diagnosis of stage IIIb (Z6X0R) left-sided colon adenocarcinoma (October 2022) s/p open sigmoid colectomy (09/05/21) and recent RIJ chest port placement (09/29/21) who presented to [facility] on 11/15 due to fevers and emesis.     Principal Problem:    Fever of undetermined origin  Active Problems:    Mild intermittent asthma without complication    Gastroesophageal reflux disease without esophagitis    Situational mixed anxiety and depressive disorder    Intractable migraine without aura and without status migrainosus    Disease of thyroid gland    Obstructive sleep apnea syndrome    Depression    Acute bilateral low back pain without sciatica    HTN (hypertension)    Anxiety    Adenocarcinoma of sigmoid colon (CMS-HCC)  Resolved Problems:    * No resolved Mitchell problems. *      Fever on unknown origin - Possible port infection  Presented with discomfort in R neck, mild redness/tenderness at port site, fever, chills, N/V, body aches, cough, sore throat for 1 day. Tmax 103.9F thus far. CXR and ECG unremarkable. Bedside POCUS showed no discrete fluid collection to the line of the port. S/p sepsis fluids, cefepime, vanc in ED. Low suspicion for endocarditis, not proceeding with echo at this time.  Port pulled on 11/16 after discussion with oncology.   -BCx with MSSA, narrowing to nafcillin with plan for 14d total of abx (EED 11/30)  -CBC with diff daily  -F/u BCx susceptibilities  -UCx with no growth thus far  -IV Zofran 4mg  for nausea, Phenergan and Ativan as backup plans  -Tylenol + Toradol for headaches, PRN rizatriptan if migraine-like   -Oncology c/s'd, in agreement with plan.  ??  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 Received port on 11/07. Was set to start capecitabine this week but was not able to I/s/o new sx.   -Oncology c/s'd, plan to adjust chemotherapy timeline outpatient with [facility]  - Plan to d/c with PICC for abx and can reevaluate access at f/u with onc    Chronic Problems:  T2DM: Atorvastatin 10 mg, SSI   Anxiety - Depression: Clonazepam 0.5 mg daily PRN, escitalopram oxalate 20 mg daily  GERD: Famotidine 20 daily   Back pain: Gabapentin 300 nightly, tizanidine 4 mg PRN   HTN: Holding BP meds for now given the possible infection, will follow up in AM   Hypothyroidism: Synthroid 25 mcg PO daily     Daily Checklist:  Diet: Regular Diet  DVT PPx: Lovenox 40mg  q24h  Electrolytes: Replete Potassium to >/= 3.6 and Magnesium to >/= 1.8  Code Status: Full Code  Dispo: Floor    Team Contact Information:   Primary Team: Infectious Disease ([facility])  Primary Resident: Nina Savannah, MD  Resident's Pager: [phone] (Infect Disease Intern - [facility])    Interval History:   No acute events overnight. States discomfort in neck went away quickly after port removal. Still having some nausea but able to keep food down and has a good appetite today. Bit of sore throat still. States she uses CPAP at home nightly and husband will bring today for RT eval, sore throat attributed to not having CPAP and declines lozenges at this time.      ROS:  endorses nausea. Denies chest or abdominal pain, headaches, difficulty breathing, pain/irritation at port site.     Objective:   Temp:  [37.1 ??C (98.8 ??F)-38.5 ??C (101.3 ??F)] 38.4 ??C (101.1 ??F)  Heart Rate:  [70-87] 86  SpO2 Pulse:  [93-103] 93  Resp:  [18-27] 22  BP: (110-182)/(52-94) 135/71  SpO2:  [89 %-100 %] 97 %    Gen: appears much better this morning with more energy, NAD, answers questions appropriately  Eyes: sclera anicteric  HENT: atraumatic, MMM  Heart: RRR, S1, S2, no M/R/G, no chest wall tenderness, cap refill < 2s  Lungs: CTAB, no crackles or wheezes, no use of accessory muscles, normal WOB on RA   Abdomen: Normoactive bowel sounds, soft, NTND, no rebound/guarding  Extremities: no clubbing, cyanosis, or edema in the BLEs  Skin: Stitches and dermabond in place at prior port site with only mild surrounding erythema  Psych: Alert, oriented, appropriate mood and affect    Labs/Studies: Labs and Studies from the last 24hrs per EMR and Reviewed    Nina Mitchell, MS3  October 08, 2021 6:47 AM    I attest that I have reviewed the student note and that the components of the history of the present illness, the physical exam, and the assessment and plan documented were performed by me or were performed in my presence by the student where I verified the documentation and performed (or re-performed) the exam and medical decision making.     Nina Savannah, MD  PGY1, Internal Medicine

## 2021-10-09 NOTE — Unmapped (Signed)
VENOUS ACCESS TEAM PROCEDURE    Order was placed for a PIV by Venous Access Team (VAT).  Patient was assessed at bedside for placement of a PIV. PPE were donned per protocol.  Access was obtained. Blood return noted.  Dressing intact and device well secured.  Flushed with normal saline.  See LDA for details.  Pt advised to inform RN of any s/s of discomfort at the PIV site.    Workup / Procedure Time:  15 minutes       primary care  RN was notified.       Thank you,     Cyndie Mull RN Venous Access Team

## 2021-10-09 NOTE — Unmapped (Signed)
[facility] INTERVENTIONAL RADIOLOGY - Operative Note     VIR Post-Procedure Note    Procedure Name: Right chest port removal    Pre-Op Diagnosis: Bacteremia    Post-Op Diagnosis: Same as pre-operative diagnosis    VIR Providers    Operator: Redge Gainer Mikhitarian    Time out: Prior to the procedure, a time out was performed with all team members present. During the time out, the patient, procedure and procedure site when applicable were verbally verified by the team members and [employer].    Description of procedure: Successful right chest port removal. Pocket clean. Closed with absorbable suture and dermabond.    Sedation:Versed only    Estimated Blood Loss: approximately <5 mL  Complications: None    See detailed procedure note with images in PACS [facility]).    The patient tolerated the procedure well without incident or complication and left the room in stable condition.    Collene Leyden  PGY-6 Interventional Radiology  10/08/2021 5:36 PM

## 2021-10-09 NOTE — Unmapped (Signed)
Pt VSS. Pt took all medication without any problems. Pt had two different fevers requiring tylenol. Pt slept on and off throughout the night. Pt complained of nausea and received zofran. No acute changes during the shift. Will continue to monitor.   Problem: Adult Inpatient Plan of Care  Goal: Plan of Care Review  Outcome: Ongoing - Unchanged  Goal: Patient-Specific Goal (Individualized)  Outcome: Ongoing - Unchanged  Goal: Absence of Hospital-Acquired Illness or Injury  Outcome: Ongoing - Unchanged  Goal: Optimal Comfort and Wellbeing  Outcome: Ongoing - Unchanged  Goal: Readiness for Transition of Care  Outcome: Ongoing - Unchanged  Goal: Rounds/Family Conference  Outcome: Ongoing - Unchanged     Problem: Diabetes Comorbidity  Goal: Blood Glucose Level Within Targeted Range  Outcome: Ongoing - Unchanged     Problem: Hypertension Comorbidity  Goal: Blood Pressure in Desired Range  Outcome: Ongoing - Unchanged

## 2021-10-10 LAB — CBC W/ AUTO DIFF
BASOPHILS ABSOLUTE COUNT: 0 10*9/L (ref 0.0–0.1)
BASOPHILS RELATIVE PERCENT: 1 %
EOSINOPHILS ABSOLUTE COUNT: 0.2 10*9/L (ref 0.0–0.5)
EOSINOPHILS RELATIVE PERCENT: 4.3 %
HEMATOCRIT: 31.2 % — ABNORMAL LOW (ref 34.0–44.0)
HEMOGLOBIN: 10.4 g/dL — ABNORMAL LOW (ref 11.3–14.9)
LYMPHOCYTES ABSOLUTE COUNT: 1.5 10*9/L (ref 1.1–3.6)
LYMPHOCYTES RELATIVE PERCENT: 40.3 %
MEAN CORPUSCULAR HEMOGLOBIN CONC: 33.2 g/dL (ref 32.0–36.0)
MEAN CORPUSCULAR HEMOGLOBIN: 26.5 pg (ref 25.9–32.4)
MEAN CORPUSCULAR VOLUME: 79.7 fL (ref 77.6–95.7)
MEAN PLATELET VOLUME: 7.7 fL (ref 6.8–10.7)
MONOCYTES ABSOLUTE COUNT: 0.4 10*9/L (ref 0.3–0.8)
MONOCYTES RELATIVE PERCENT: 11.8 %
NEUTROPHILS ABSOLUTE COUNT: 1.6 10*9/L — ABNORMAL LOW (ref 1.8–7.8)
NEUTROPHILS RELATIVE PERCENT: 42.6 %
PLATELET COUNT: 206 10*9/L (ref 150–450)
RED BLOOD CELL COUNT: 3.91 10*12/L — ABNORMAL LOW (ref 3.95–5.13)
RED CELL DISTRIBUTION WIDTH: 16.4 % — ABNORMAL HIGH (ref 12.2–15.2)
WBC ADJUSTED: 3.7 10*9/L (ref 3.6–11.2)

## 2021-10-10 LAB — COMPREHENSIVE METABOLIC PANEL
ALBUMIN: 2.7 g/dL — ABNORMAL LOW (ref 3.4–5.0)
ALKALINE PHOSPHATASE: 74 U/L (ref 46–116)
ALT (SGPT): 86 U/L — ABNORMAL HIGH (ref 10–49)
ANION GAP: 6 mmol/L (ref 5–14)
AST (SGOT): 74 U/L — ABNORMAL HIGH (ref <=34)
BILIRUBIN TOTAL: 0.4 mg/dL (ref 0.3–1.2)
BLOOD UREA NITROGEN: 6 mg/dL — ABNORMAL LOW (ref 9–23)
BUN / CREAT RATIO: 11
CALCIUM: 8.2 mg/dL — ABNORMAL LOW (ref 8.7–10.4)
CHLORIDE: 106 mmol/L (ref 98–107)
CO2: 28 mmol/L (ref 20.0–31.0)
CREATININE: 0.53 mg/dL — ABNORMAL LOW
EGFR CKD-EPI (2021) FEMALE: >90 mL/min/{1.73_m2} (ref >=60)
GLUCOSE RANDOM: 207 mg/dL — ABNORMAL HIGH (ref 70–179)
POTASSIUM: 3.3 mmol/L — ABNORMAL LOW (ref 3.4–4.8)
PROTEIN TOTAL: 6 g/dL (ref 5.7–8.2)
SODIUM: 140 mmol/L (ref 135–145)

## 2021-10-10 LAB — SLIDE REVIEW

## 2021-10-10 MED ADMIN — atorvastatin (LIPITOR) tablet 10 mg: 10 mg | ORAL | @ 02:00:00

## 2021-10-10 MED ADMIN — potassium chloride (KLOR-CON) CR tablet 40 mEq: 40 meq | ORAL | @ 14:00:00 | Stop: 2021-10-10

## 2021-10-10 MED ADMIN — escitalopram oxalate (LEXAPRO) tablet 20 mg: 20 mg | ORAL | @ 14:00:00

## 2021-10-10 MED ADMIN — famotidine (PEPCID) tablet 20 mg: 20 mg | ORAL | @ 14:00:00 | Stop: 2021-10-10

## 2021-10-10 MED ADMIN — nafcillin (NALLPEN) 2 g in sodium chloride 0.9 % (NS) 100 mL IVPB-connector bag: 2 g | INTRAVENOUS | @ 02:00:00 | Stop: 2021-10-13

## 2021-10-10 MED ADMIN — nafcillin (NALLPEN) 2 g in sodium chloride 0.9 % (NS) 100 mL IVPB-connector bag: 2 g | INTRAVENOUS | @ 20:00:00 | Stop: 2021-10-22

## 2021-10-10 MED ADMIN — insulin lispro (HumaLOG) injection 0-20 Units: 0-20 [IU] | SUBCUTANEOUS | @ 04:00:00

## 2021-10-10 MED ADMIN — insulin lispro (HumaLOG) injection 0-20 Units: 0-20 [IU] | SUBCUTANEOUS | @ 23:00:00

## 2021-10-10 MED ADMIN — tiZANidine (ZANAFLEX) tablet 4 mg: 4 mg | ORAL | @ 02:00:00

## 2021-10-10 MED ADMIN — nafcillin (NALLPEN) 2 g in sodium chloride 0.9 % (NS) 100 mL IVPB-connector bag: 2 g | INTRAVENOUS | @ 17:00:00 | Stop: 2021-10-22

## 2021-10-10 MED ADMIN — levothyroxine (SYNTHROID) tablet 25 mcg: 25 ug | ORAL | @ 10:00:00

## 2021-10-10 MED ADMIN — insulin lispro (HumaLOG) injection 0-20 Units: 0-20 [IU] | SUBCUTANEOUS | @ 14:00:00

## 2021-10-10 MED ADMIN — nafcillin (NALLPEN) 2 g in sodium chloride 0.9 % (NS) 100 mL IVPB-connector bag: 2 g | INTRAVENOUS | @ 10:00:00 | Stop: 2021-10-22

## 2021-10-10 MED ADMIN — gabapentin (NEURONTIN) capsule 300 mg: 300 mg | ORAL | @ 02:00:00

## 2021-10-10 MED ADMIN — nafcillin (NALLPEN) 2 g in sodium chloride 0.9 % (NS) 100 mL IVPB-connector bag: 2 g | INTRAVENOUS | @ 08:00:00 | Stop: 2021-10-22

## 2021-10-10 MED ADMIN — enoxaparin (LOVENOX) syringe 40 mg: 40 mg | SUBCUTANEOUS | @ 02:00:00

## 2021-10-10 MED ADMIN — potassium chloride (KLOR-CON) CR tablet 40 mEq: 40 meq | ORAL | @ 02:00:00 | Stop: 2021-10-09

## 2021-10-10 MED ADMIN — famotidine (PEPCID) tablet 20 mg: 20 mg | ORAL | @ 17:00:00 | Stop: 2021-10-10

## 2021-10-10 MED ADMIN — enoxaparin (LOVENOX) syringe 40 mg: 40 mg | SUBCUTANEOUS | @ 14:00:00

## 2021-10-10 NOTE — Unmapped (Signed)
Pt VSS. Pt took all medication without any problems. Pt slept on and off throughout the night. No acute changes during the shift. Will continue to monitor.   Problem: Adult Inpatient Plan of Care  Goal: Plan of Care Review  Outcome: Ongoing - Unchanged  Goal: Patient-Specific Goal (Individualized)  Outcome: Ongoing - Unchanged  Goal: Absence of Hospital-Acquired Illness or Injury  Outcome: Ongoing - Unchanged  Goal: Optimal Comfort and Wellbeing  Outcome: Ongoing - Unchanged  Goal: Readiness for Transition of Care  Outcome: Ongoing - Unchanged  Goal: Rounds/Family Conference  Outcome: Ongoing - Unchanged     Problem: Diabetes Comorbidity  Goal: Blood Glucose Level Within Targeted Range  Outcome: Ongoing - Unchanged     Problem: Hypertension Comorbidity  Goal: Blood Pressure in Desired Range  Outcome: Ongoing - Unchanged

## 2021-10-10 NOTE — Unmapped (Signed)
Pt is stable. Afebrile this shift. Pt complains of mild nausea, with relief provided by PRN zofran. Pt complains of migraine with relief provided by rizatriptan. Pt complained of heart palpitations and irregular heart beat, heart sounds WDL, vitals stable, pt believed it could be related to anxiety, PRN clonazepam given, pt reported improved symptoms. No concerns at this time. Will continue to follow plan of care.    Problem: Adult Inpatient Plan of Care  Goal: Plan of Care Review  Outcome: Ongoing - Unchanged  Flowsheets (Taken 10/09/2021 1800)  Progress: no change  Plan of Care Reviewed With: patient     Problem: Adult Inpatient Plan of Care  Goal: Patient-Specific Goal (Individualized)  Outcome: Ongoing - Unchanged  Flowsheets (Taken 10/09/2021 1800)  Patient-Specific Goals (Include Timeframe): Pt will remain free from falls/injuries this shift  Individualized Care Needs: IV abx, glucose monitoring  Anxieties, Fears or Concerns: overwhelmed with a lot of information, pain management

## 2021-10-10 NOTE — Unmapped (Signed)
Physician Discharge Summary [facility]  [address] [facility]  [address]  [place] [address]  Dept: [phone]  Loc: [phone]     Identifying Information:   Nina Mitchell  1971-08-24  [id number]    Primary Care Physician: Terrall Laity, PA     Code Status: Full Code    Admit Date: 10/07/2021    Discharge Date: 10/13/2021     Discharge To: Home with Home Health and/or PT/OT    Discharge Service: [facility] - Infectious Disease Floor Team ([facility])     Discharge Attending Physician: Alphonsa Gin, MD    Discharge Diagnoses:   Principal Problem:    MSSA bacteremia POA: Yes  Active Problems:    Mild intermittent asthma without complication POA: Yes    Gastroesophageal reflux disease without esophagitis POA: Yes    Situational mixed anxiety and depressive disorder POA: Yes    Intractable migraine without aura and without status migrainosus POA: Yes    Disease of thyroid gland POA: Yes    Obstructive sleep apnea syndrome POA: Yes    Depression POA: Yes    Acute bilateral low back pain without sciatica POA: Yes    HTN (hypertension) POA: Yes    Anxiety POA: Yes    Adenocarcinoma of sigmoid colon (CMS-HCC) POA: Yes  Resolved Problems:    Fever of undetermined origin POA: Yes      Hospital Course:   [ ] DO NOT REMOVE PICC LINE UNTIL HER FIRST CHEMOTHERAPY    Nina Mitchell is a 50 y.o. female with a PMHx of HTN, T2DM, HLD, GERD, hypothyroidism, depression/anxiety, migraines, and recent diagnosis of stage IIIb (Q1J9E) left-sided colon adenocarcinoma (October 2022) s/p open sigmoid colectomy (09/05/21) and recent RIJ chest port placement (09/29/21) who presented to [facility] ED on 11/15 due to fevers and emesis.     Sepsis due to port infection - Uncomplicated MSSA bacteremia  Presented with discomfort in R neck, mild redness/tenderness at port site, fever, chills, N/V, body aches, cough, sore throat for 1 day after being told to come in by VIR. Tmax during admission was 103.12F. ED workup included negative CXR and EKG, bedside POCUS without discrete fluid collection under port, CBC with leukocytosis to 13.3 with left shift. She was started empirically on vancomycin/cefepime and sepsis fluids on 11/15. Blood cultures grew MSSA and she was switched to Nafcillin. Persistent nausea and headaches were managed supportively with IV zofran and NSAIDs. Urine cultures grew nothing. Without another clear source of infection, port was thought to be most likely source and was removed on 11/16 after discussion with oncology. Clinical status improved significantly after port removal. Nina Mitchell was having some loose stools beginning on admission day 2; in the absence of abdominal pain or other symptoms, this was attributed to nafcillin disruption of gut flora in combination with reported anxiety from patient and was managed with loperamide. Discharged on IV nafcillin with EED 10/22/21 and plan to monitor CBC with differential and CMP via home health, labs to go to Dr. Rozell Searing (Oncologist).     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 Received port on 11/07. Was set to start capecitabine this week but was not able to I/s/o new sx. Oncology was consulted during hospitalization and was in agreement with port removal. The port was removed on 11/17 with plan to adjust chemo initiation timeline outpatient with Dr. Rozell Searing, f/u planned for 12/2. No new port needed at this time per  oncology team. Sent home with PICC line set to stay in following abx course for chemo administration.     Mild transaminase elevation  Transaminases mildly elevated during this hospitalization (peak AST/ALT 120/98), as well as dating back to 2018. Benign abdominal exam and patient reports CT without evidence of metastases.  Given body habitus, patient may have mild steatohepatitis. RUQ US showed an enlarged echogenic liver consistent with hepatic steatosis. Recommend counseling on diet & lifestyle modifications outpatient as appropriate.     Chronic Problems:  T2DM: Continued Atorvastatin 10 mg, SSI   Anxiety - Depression: Continued Clonazepam 0.5 mg daily PRN, escitalopram oxalate 20 mg daily  GERD: Continued Famotidine 20 daily   Back pain: Continued Gabapentin 300 nightly, tizanidine 4 mg PRN   HTN: Held home lisinopril and atenolol until 11/20 after multiple hypertensive pressures 11/19-20  Hypothyroidism: Continued Synthroid 25 mcg PO daily     Outpatient Provider Follow Up Issues:   [ ]  Hepatic steatosis dietary & lifestyle counseling     Procedures:  PICC line and port removal   ______________________________________________________________________  Discharge Medications:           Your Medication List      STOP taking these medications    ACCU-CHEK GUIDE ME GLUCOSE MTR Misc  Generic drug: blood-glucose meter     ACCU-CHEK GUIDE TEST STRIPS Strp  Generic drug: blood sugar diagnostic     ACCU-CHEK SOFTCLIX LANCETS Misc  Generic drug: lancets        START taking these medications    sodium chloride 0.9 % SolP 100 mL with nafcillin 1 gram SolR 12 g, OVERFILL 50 Inj 10 mL  Infuse 12 g into a venous catheter daily for 9 days. 12g once daily via continuous infusion. Last day = 10/22/21        CHANGE how you take these medications    famotidine 40 MG tablet  Commonly known as: PEPCID  Take 1 tablet (40 mg total) by mouth Two (2) times a day.  What changed:   ?? medication strength  ?? how much to take  ?? when to take this        CONTINUE taking these medications    acetaminophen 500 MG tablet  Commonly known as: TYLENOL  Take 500 mg by mouth every twelve (12) hours as needed for pain.     atenoloL 25 MG tablet  Commonly known as: TENORMIN  Take 1 mg by mouth nightly.     capecitabine 150 MG tablet  Commonly known as: XELODA  Take 2 tablets (300 mg total) by mouth Two (2) times a day  with 1 other capecitabine prescription for 2,300 mg total. Take Days 1 through 14, then 1 week off.     capecitabine 500 MG tablet  Commonly known as: XELODA  Take 4 tablets (2,000 mg total) by mouth Two (2) times a day  with 1 other capecitabine prescription for 2,300 mg total. Take Days 1 through 14, then 1 week off.     clonazePAM 0.5 MG tablet  Commonly known as: KlonoPIN  0.5 mg daily as needed.     docusate sodium 100 MG capsule  Commonly known as: COLACE  Take 100 mg by mouth nightly as needed for constipation.     ESCITALOPRAM OXALATE ORAL  20 mg daily.     gabapentin 300 MG capsule  Commonly known as: NEURONTIN  Take 300 mg by mouth nightly.     levothyroxine 25 MCG tablet  Commonly known  as: SYNTHROID  Take 25 mcg by mouth daily.     lisinopriL 2.5 MG tablet  Commonly known as: PRINIVIL,ZESTRIL  2.5 mg  in the morning.     loperamide 2 mg capsule  Commonly known as: IMODIUM  If having diarrhea, take 2 capsules (4 mg) after the first loose stool, then 1 capsule every 2 hours until diarrhea free for 12 hours.     multivitamin with minerals tablet  Take 1 tablet by mouth daily.     onabotulinumtoxinA 200 unit Solr injection  Commonly known as: BOTOX  Inject 200 Units into the skin Every three (3) months.     ondansetron 8 MG disintegrating tablet  Commonly known as: ZOFRAN-ODT  Take 1 tablet by mouth twice daily on days 2 and 3 of chemotherapy cycles. May take up to 1 tablet every 8 hours as needed (but do not take on day 1 of chemotherapy cycles).     oxyCODONE-acetaminophen 10-325 mg per tablet  Commonly known as: PERCOCET  Take 1 tablet by mouth every six (6) hours as needed.     rizatriptan 10 MG disintegrating tablet  Commonly known as: MAXALT-MLT  Take 10 mg by mouth daily as needed.     rosuvastatin 5 MG tablet  Commonly known as: CRESTOR  Take 5 mg by mouth every evening.     tiZANidine 4 MG tablet  Commonly known as: ZANAFLEX  Take 4 mg by mouth every eight (8) hours as needed.            Allergies:  Bupropion, Prochlorperazine maleate, Sulfa (sulfonamide antibiotics), Sulfamethoxazole-trimethoprim, Wellbutrin [bupropion hcl], Benzonatate, and Cefaclor  ______________________________________________________________________  Pending Test Results:  Pending Labs     Order Current Status    Blood Culture #1 Preliminary result    Blood Culture #2 Preliminary result          Most Recent Labs:  All lab results last 24 hours -   Recent Results (from the past 24 hour(s))   POCT Glucose    Collection Time: 10/12/21 12:02 PM   Result Value Ref Range    Glucose, POC 146 70 - 179 mg/dL   POCT Glucose    Collection Time: 10/12/21  4:43 PM   Result Value Ref Range    Glucose, POC 168 70 - 179 mg/dL   POCT Glucose    Collection Time: 10/12/21  7:35 PM   Result Value Ref Range    Glucose, POC 214 (H) 70 - 179 mg/dL   Comprehensive Metabolic Panel    Collection Time: 10/13/21  5:48 AM   Result Value Ref Range    Sodium 140 135 - 145 mmol/L    Potassium 3.5 3.4 - 4.8 mmol/L    Chloride 105 98 - 107 mmol/L    CO2 30.0 20.0 - 31.0 mmol/L    Anion Gap 5 5 - 14 mmol/L    BUN 6 (L) 9 - 23 mg/dL    Creatinine 0.98 (L) 0.60 - 0.80 mg/dL    BUN/Creatinine Ratio 12     eGFR CKD-EPI (2021) Female >90 >=60 mL/min/1.5m2    Glucose 189 (H) 70 - 179 mg/dL    Calcium 9.0 8.7 - 11.9 mg/dL    Albumin 3.3 (L) 3.4 - 5.0 g/dL    Total Protein 7.1 5.7 - 8.2 g/dL    Total Bilirubin 0.3 0.3 - 1.2 mg/dL    AST 52 (H) <=14 U/L    ALT 70 (H) 10 - 49 U/L  Alkaline Phosphatase 84 46 - 116 U/L   CBC w/ Differential    Collection Time: 10/13/21  5:48 AM   Result Value Ref Range    WBC 6.8 3.6 - 11.2 10*9/L    RBC 4.30 3.95 - 5.13 10*12/L    HGB 11.2 (L) 11.3 - 14.9 g/dL    HCT 09.8 11.9 - 14.7 %    MCV 79.2 77.6 - 95.7 fL    MCH 26.1 25.9 - 32.4 pg    MCHC 32.9 32.0 - 36.0 g/dL    RDW 82.9 (H) 56.2 - 15.2 %    MPV 7.2 6.8 - 10.7 fL    Platelet 337 150 - 450 10*9/L    Neutrophils % 48.1 %    Lymphocytes % 35.2 %    Monocytes % 9.8 %    Eosinophils % 5.7 %    Basophils % 1.2 %    Absolute Neutrophils 3.3 1.8 - 7.8 10*9/L    Absolute Lymphocytes 2.4 1.1 - 3.6 10*9/L    Absolute Monocytes 0.7 0.3 - 0.8 10*9/L    Absolute Eosinophils 0.4 0.0 - 0.5 10*9/L    Absolute Basophils 0.1 0.0 - 0.1 10*9/L    Microcytosis Slight (A) Not Present    Anisocytosis Slight (A) Not Present    Hypochromasia Slight (A) Not Present   POCT Glucose    Collection Time: 10/13/21  7:35 AM   Result Value Ref Range    Glucose, POC 183 (H) 70 - 179 mg/dL       Relevant Studies/Radiology:  ECG 12 Lead    Result Date: 10/08/2021  SINUS TACHYCARDIA CANNOT RULE OUT ANTERIOR INFARCT  , AGE UNDETERMINED ABNORMAL ECG NO PREVIOUS ECGS AVAILABLE Confirmed by Warnell Forester ([id number]) on 10/08/2021 7:30:54 AM    XR Chest 2 views    Result Date: 10/07/2021  EXAM: XR CHEST 2 VIEWS DATE: 10/07/2021 3:27 PM ACCESSION: [id number] UN DICTATED: 10/07/2021 3:30 PM INTERPRETATION LOCATION: [facility] CLINICAL INDICATION: 51 years old Female with FEVER  COMPARISON: Chest 04/01/2017. TECHNIQUE: PA and Lateral Chest Radiographs. FINDINGS: Radiographically clear lungs. Right central line overlying the cavoatrial junction. No pleural effusion or pneumothorax. Stable cardiomediastinal silhouette.     Clear lungs. Right central line overlying the cavoatrial junction.    ED POCUS Soft Tissue Head And Neck    Result Date: 10/11/2021  Limited Soft Tissue Ultrasound Indication: A focused ultrasound of soft tissue was performed to evaluate for cellulitis, abscess, or foreign body. The ultrasound was performed with the following indications, as noted in the H&P: Soft tissue pain and Soft tissue redness Identified structures:  Precise location of the soft tissue evaluation: R chest Port-A-Cath Findings: Exam of the above structures revealed the following findings:  Abscess: Absent  Cellulitis: trace cobblestoning surrounding port line -- possible cellulitis  Other: Impression:   trace cobblestoning surrounding port line -- possible cellulitis Interpreted by: Sheppard Evens, MD Quality Assurance  After review of the point-of-care ultrasound performed in this case I assess the overall image quality as: Image quality: Minimal criteria met for diagnosis, all structures imaged well and diagnosis easily supported  The accuracy of interpretation of images as presented reflects a true positive. This study does  meet minimum criteria for credentialing and billing. Penni Bombard, MD     US Abdomen Limited    Result Date: 10/13/2021  EXAM: US ABDOMEN LIMITED DATE: 10/13/2021 ACCESSION: [id number] UN DICTATED: 10/13/2021 7:31 AM INTERPRETATION LOCATION: [facility] CLINICAL INDICATION: 50 years old Female with Mild  persistent LFT elevation, concern for fatty liver vs other etiology  TECHNIQUE: Static and cine images of the right upper quadrant were performed. COMPARISON: Outside CT abdomen pelvis 07/23/2021 FINDINGS: LIVER: The liver was mildly enlarged, increased in echogenicity. Heterogeneity and poor penetration of the liver limits evaluation for focal liver lesions. No focal hepatic lesions. No intrahepatic biliary ductal dilatation. Partially visualized proximal common bile duct is within normal limits, postcholecystectomy. GALLBLADDER: Surgically absent. LIMITED RIGHT KIDNEY: No hydronephrosis.     -No biliary dilatation. -Enlarged echogenic liver suggesting hepatic steatosis, similar to prior CT. Please see below for data measurements: Liver: 18.1 cm Proximal common bile duct: 0.65 cm     IR Port Removal    Result Date: 10/08/2021  EXAM: IMPLANTED PORT-A-CATH REMOVAL DATE: 10/08/2021 5:02 PM ACCESSION: [id number] UN DICTATED: 10/08/2021 5:40 PM INTERPRETATION LOCATION: [facility] CLINICAL INDICATION: 50 years old Female status post right chest port placement admitted with bacteremia and concern for port infection requiring port removal. CONSENT: Informed consent was obtained from the patient including a discussion of the alternatives, benefits, and risks including but not limited to infection, bleeding, and/or need for additional procedure. With the patient in the supine position, the right neck and upper chest port site were prepped and draped using all elements of maximal sterile barrier technique.  PORT-A-CATH REMOVAL: The original port pocket incision site was anesthetized with 1% lidocaine. Using a #15 blade, an incision was made over the pocket site. Using blunt dissection, the port chamber was externalized and the catheter leading to the jugular vein was completely removed. Hemostasis was obtained using manual compression at the incision and right internal jugular vein access sites.  The port pocket demonstrated no signs of infection. The pocket incision was closed with subcutaneous and subcuticular absorbable sutures.  Dermabond was applied over the incision site. SEDATION: Versed only.   Dr. Cathie Hoops was present for and supervised the entire procedure. Trainee: Dr. Collene Leyden     Successful removal of a right internal jugular vein chest port-a-cath.     ______________________________________________________________________  Discharge Instructions:   Activity Instructions     Activity as tolerated            Diet Instructions     Discharge diet (specify)      Discharge Nutrition Therapy: Regular          Resources and Referrals    [facility]: [phone]    [facility]: [phone]    Please call with any questions or concerns.          You were admitted to [facility] for fevers and emesis. You were found to have Stap bacteremia possibly secondary to port line. We removed port line, started you on appropriate antibiotic, nafcillin. After antibiotic treatment and symptomatic management of your nausea, stomach upset, you got better. We placed PICC line so you could continue taking antibiotic infusions at home. We will keep PICC line until your first scheduled chemotherapy. We also did imaging of your liver given the increase in liver function tests. It showed hepatic steatosis (fatty liver). Your tests are improving, should follow up with PCP to continue monitoring, try exercising and healthy lifestyle as much as possible. Please take medication as directed in below attached document. Please carefully read and follow these instructions below upon your discharge: 1) Please take your medications as prescribed and note the changes listed on your discharge. At future follow-up appointments, please be sure to take all of your medications with you so  your provider can better guide your care. 2) Seek medical care with your primary care doctor or local Emergency Room or Urgent Care if you develop any changes in your mental status, worsening abdominal pain, fevers greater than 101.5, any unexplained/unrelieved shortness of breath, uncontrolled nausea and vomiting that keeps you from remaining hydrated or taking your medication, or any other concerning symptoms. 3) Please go to your follow-up appointments. Some of your follow-up appointments have been listed below. If you do not see an appointment listed below with your primary care doctor, please call your doctor's office as soon as possible to schedule an appointment to be seen within 7-10 days of discharge. 4) If you have any concerns before you are able to follow-up with your primary care doctor, you can reach [place] by calling [phone] and asking to page the MED K resident on call.      Appointments which have been scheduled for you    Oct 24, 2021  8:00 AM  (Arrive by 7:30 AM)  NURSE LAB DRAW with ADULT ONC LAB  [facility] ADULT ONCOLOGY LAB DRAW STATION [place] [facility] REGION) [address]  [place] [address]  [phone]      Oct 24, 2021  9:00 AM  (Arrive by 8:30 AM)  RETURN ACTIVE [place] with Phil Dopp, MD  [facility] ONCOLOGY MULTIDISCIPLINARY 2ND FLR CANCER HOSP [facility] REGION) [address]  [place] [address]  [phone]      Oct 24, 2021 10:20 AM  (Arrive by 9:50 AM)  NEW NUTRITION with Howie Ill, RD/LDN  [facility] NUTRITION SERVICES CANCER HOSP [place] [facility] REGION) [address]  [place] [address]  [phone]      Oct 24, 2021 10:30 AM  (Arrive by 10:00 AM)  LEVEL 210 with ONCDEV CHAIR 52  [place] ONCOLOGY INFUSION [place] [facility] REGION) [address]  [place] [address]  [phone]           ______________________________________________________________________  Discharge Day Services:  BP 157/108  - Pulse 88  - Temp 36.9 ??C (98.4 ??F) (Oral)  - Resp 16  - SpO2 96%     Pt seen on the day of discharge and determined appropriate for discharge.    Condition at Discharge: good    Length of Discharge: I spent greater than 30 mins in the discharge of this patient.    [name], MS3    I attest that I have reviewed the student note and that the components of the history of the present illness, the physical exam, and the assessment and plan documented were performed by me or were performed in my presence by the student where I verified the documentation and performed (or re-performed) the exam and medical decision making.     Yehuda Savannah, MD   PGY1, Internal Medicine

## 2021-10-10 NOTE — Unmapped (Signed)
MED K Treatment Plan    Infectious Disease Diagnosis:   1. MSSA Bacteremia from port site      Antibiotics (including dose): Nafcillin 2g q4h  Antibiotic Start Date: 10/08/21  Anticipated Antibiotic Stop Date (contingent upon): 10/22/21  IV access and date inserted: PICC line inserted 10/12/21      Labs to be drawn after discharge WEEKLY unless denoted otherwise below:   _X_ CBC with differential   ___ Basic metabolic panel   _X_ Comprehensive metabolic panel   ___ Trough value for vancomycin (goal 15 - 20)  ___ Trough value for aminoglycoside (Goal trough ___________)   ___ CK (Daptomycin)  ___ ESR and CRP  ___ Other:       Fax all lab results to:  Dr. Phil Dopp, MD   Fax #: [phone]    Studies related to ID consultation still pending:   RUQ [place]             Provider or team following pending and future laboratory results:   Dr. Phil Dopp, MD, Oncology  Fax #: [phone]    If additional Infectious Disease input is required, please contact;    CC to: ____________________ (referring MDs, outpatient ID fellow & attending, Inpatient ID attending, ID clinic       Please consider reviewing this plan with patient and giving patient/family a copy of this form during your discharge teaching.       I attest that I have reviewed the student note and that the components of the history of the present illness, the physical exam, and the assessment and plan documented were performed by me or were performed in my presence by the student where I verified the documentation and performed (or re-performed) the exam and medical decision making.     Yehuda Savannah, MD  PGY1, Internal Medicine

## 2021-10-10 NOTE — Unmapped (Signed)
Infectious Disease ([facility]) Progress Note    Assessment & Plan:   Nina Mitchell is a 50 y.o. female with a PMHx of HTN, T2DM, HLD, GERD, hypothyroidism, depression/anxiety, migraines, and recent diagnosis of stage IIIb ([id number]) left-sided colon adenocarcinoma (October 2022) s/p open sigmoid colectomy (09/05/21) and recent RIJ chest port placement (09/29/21) who presented to [facility] ED on 11/15 due to fevers and emesis.     Principal Problem:    Fever of undetermined origin  Active Problems:    Mild intermittent asthma without complication    Gastroesophageal reflux disease without esophagitis    Situational mixed anxiety and depressive disorder    Intractable migraine without aura and without status migrainosus    Disease of thyroid gland    Obstructive sleep apnea syndrome    Depression    Acute bilateral low back pain without sciatica    HTN (hypertension)    Anxiety    Adenocarcinoma of sigmoid colon (CMS-HCC)  Resolved Problems:    * No resolved hospital problems. *      Fever on unknown origin - port infection  Presented with discomfort in R neck, mild redness/tenderness at port site, fever, chills, N/V, body aches, cough, sore throat for 1 day. Tmax 103.88F thus far. CXR and ECG unremarkable. Bedside POCUS showed no discrete fluid collection to the line of the port. S/p sepsis fluids, cefepime, vanc in ED. Low suspicion for endocarditis, not proceeding with echo at this time.  Port pulled on 11/16 after discussion with oncology.   -BCx with MSSA, nafcillin for 14d total of abx (EED 11/30), susceptibilities back, will continue the same regimen  -CBC with diff daily  -PICC when BCx are negative   -UCx with no growth  -IV Zofran 4mg  for nausea, Phenergan and Ativan as backup plans  -Tylenol + Toradol for headaches, PRN rizatriptan if migraine-like   -Oncology c/s'd, in agreement with plan.  -Increase Famotidine to 40 BID and add Loperamide for Stomach upset and diarrhea   ??  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 Received port on 11/07. Was set to start capecitabine this week but was not able to I/s/o new sx.   -Oncology c/s'd, plan to adjust chemotherapy timeline outpatient with Dr. Rozell Searing  -Plan to d/c with PICC for abx and can reevaluate access at f/u with onc    Chronic Problems:  T2DM: Atorvastatin 10 mg, SSI   Anxiety - Depression: Clonazepam 0.5 mg daily PRN, escitalopram oxalate 20 mg daily  GERD: Famotidine 20 daily   Back pain: Gabapentin 300 nightly, tizanidine 4 mg PRN   HTN: Holding BP meds for now given the possible infection, will follow up in AM   Hypothyroidism: Synthroid 25 mcg PO daily     Daily Checklist:  Diet: Regular Diet  DVT PPx: Lovenox 40mg  q24h  Electrolytes: Replete Potassium to >/= 3.6 and Magnesium to >/= 1.8  Code Status: Full Code  Dispo: Floor    Team Contact Information:   Primary Team: Infectious Disease ([facility])  Primary Resident: Yehuda Savannah, MD  Resident's Pager: [phone] (Infect Disease Intern - [name])    Interval History:   No acute events overnight. Continued to have some nausea and headaches yesterday, both relieved by PRNs. Also had some palpitations attributed to anxiety, relieved by clonazepam. Had 3x loose BMs yesterday. She slept very well overnight. Was more rested this morning. Her nausea, vomiting is better. For stomach issues and diarrhea, she  agreed on increasing famotidine dosing and also adding loperamide.     ROS: endorses nausea. Denies chest or abdominal pain, headaches, difficulty breathing, pain/irritation at port site.     Objective:   Temp:  [37 ??C (98.6 ??F)-37.9 ??C (100.2 ??F)] 37 ??C (98.6 ??F)  Heart Rate:  [63-71] 67  Resp:  [18] 18  BP: (111-116)/(62-75) 115/75  SpO2:  [93 %-98 %] 98 %    Gen: in no NAD, answers questions appropriately  Eyes: sclera anicteric  HENT: atraumatic, MMM  Heart: RRR, S1, S2, no M/R/G, no chest wall tenderness, cap refill < 2s  Lungs: CTAB, no crackles or wheezes, no use of accessory muscles, normal WOB on RA   Abdomen: Normoactive bowel sounds, soft, NTND, no rebound/guarding  Extremities: no clubbing, cyanosis, or edema in the BLEs  Skin: Stitches and dermabond in place at prior port site with only mild surrounding erythema  Psych: Alert, oriented, appropriate mood and affect    Labs/Studies: Labs and Studies from the last 24hrs per EMR and Reviewed    [name], MS3  October 10, 2021 6:59 AM    I attest that I have reviewed the student note and that the components of the history of the present illness, the physical exam, and the assessment and plan documented were performed by me or were performed in my presence by the student where I verified the documentation and performed (or re-performed) the exam and medical decision making.     Yehuda Savannah, MD  PGY1, Internal Medicine

## 2021-10-11 LAB — CBC W/ AUTO DIFF
BASOPHILS ABSOLUTE COUNT: 0 10*9/L (ref 0.0–0.1)
BASOPHILS RELATIVE PERCENT: 0.9 %
EOSINOPHILS ABSOLUTE COUNT: 0.2 10*9/L (ref 0.0–0.5)
EOSINOPHILS RELATIVE PERCENT: 4 %
HEMATOCRIT: 34.2 % (ref 34.0–44.0)
HEMOGLOBIN: 11.2 g/dL — ABNORMAL LOW (ref 11.3–14.9)
LYMPHOCYTES ABSOLUTE COUNT: 1.9 10*9/L (ref 1.1–3.6)
LYMPHOCYTES RELATIVE PERCENT: 39.1 %
MEAN CORPUSCULAR HEMOGLOBIN CONC: 32.6 g/dL (ref 32.0–36.0)
MEAN CORPUSCULAR HEMOGLOBIN: 25.9 pg (ref 25.9–32.4)
MEAN CORPUSCULAR VOLUME: 79.2 fL (ref 77.6–95.7)
MEAN PLATELET VOLUME: 7.4 fL (ref 6.8–10.7)
MONOCYTES ABSOLUTE COUNT: 0.5 10*9/L (ref 0.3–0.8)
MONOCYTES RELATIVE PERCENT: 11.3 %
NEUTROPHILS ABSOLUTE COUNT: 2.2 10*9/L (ref 1.8–7.8)
NEUTROPHILS RELATIVE PERCENT: 44.7 %
PLATELET COUNT: 239 10*9/L (ref 150–450)
RED BLOOD CELL COUNT: 4.32 10*12/L (ref 3.95–5.13)
RED CELL DISTRIBUTION WIDTH: 16.9 % — ABNORMAL HIGH (ref 12.2–15.2)
WBC ADJUSTED: 4.8 10*9/L (ref 3.6–11.2)

## 2021-10-11 LAB — COMPREHENSIVE METABOLIC PANEL
ALBUMIN: 3.2 g/dL — ABNORMAL LOW (ref 3.4–5.0)
ALKALINE PHOSPHATASE: 85 U/L (ref 46–116)
ALT (SGPT): 80 U/L — ABNORMAL HIGH (ref 10–49)
ANION GAP: 9 mmol/L (ref 5–14)
AST (SGOT): 65 U/L — ABNORMAL HIGH (ref <=34)
BILIRUBIN TOTAL: 0.4 mg/dL (ref 0.3–1.2)
BLOOD UREA NITROGEN: 6 mg/dL — ABNORMAL LOW (ref 9–23)
BUN / CREAT RATIO: 11
CALCIUM: 8.6 mg/dL — ABNORMAL LOW (ref 8.7–10.4)
CHLORIDE: 104 mmol/L (ref 98–107)
CO2: 28 mmol/L (ref 20.0–31.0)
CREATININE: 0.54 mg/dL — ABNORMAL LOW
EGFR CKD-EPI (2021) FEMALE: >90 mL/min/{1.73_m2} (ref >=60)
GLUCOSE RANDOM: 151 mg/dL (ref 70–179)
POTASSIUM: 3.6 mmol/L (ref 3.4–4.8)
PROTEIN TOTAL: 6.7 g/dL (ref 5.7–8.2)
SODIUM: 141 mmol/L (ref 135–145)

## 2021-10-11 MED ADMIN — insulin lispro (HumaLOG) injection 0-20 Units: 0-20 [IU] | SUBCUTANEOUS | @ 02:00:00

## 2021-10-11 MED ADMIN — nafcillin (NALLPEN) 2 g in sodium chloride 0.9 % (NS) 100 mL IVPB-connector bag: 2 g | INTRAVENOUS | @ 11:00:00 | Stop: 2021-10-22

## 2021-10-11 MED ADMIN — escitalopram oxalate (LEXAPRO) tablet 20 mg: 20 mg | ORAL | @ 13:00:00

## 2021-10-11 MED ADMIN — enoxaparin (LOVENOX) syringe 40 mg: 40 mg | SUBCUTANEOUS | @ 13:00:00

## 2021-10-11 MED ADMIN — nafcillin (NALLPEN) 2 g in sodium chloride 0.9 % (NS) 100 mL IVPB-connector bag: 2 g | INTRAVENOUS | @ 15:00:00 | Stop: 2021-10-22

## 2021-10-11 MED ADMIN — famotidine (PEPCID) tablet 40 mg: 40 mg | ORAL | @ 13:00:00

## 2021-10-11 MED ADMIN — ketorolac (TORADOL) injection 15 mg: 15 mg | INTRAVENOUS | @ 15:00:00 | Stop: 2021-10-13

## 2021-10-11 MED ADMIN — insulin lispro (HumaLOG) injection 0-20 Units: 0-20 [IU] | SUBCUTANEOUS | @ 13:00:00

## 2021-10-11 MED ADMIN — nafcillin (NALLPEN) 2 g in sodium chloride 0.9 % (NS) 100 mL IVPB-connector bag: 2 g | INTRAVENOUS | @ 19:00:00 | Stop: 2021-10-22

## 2021-10-11 MED ADMIN — nafcillin (NALLPEN) 2 g in sodium chloride 0.9 % (NS) 100 mL IVPB-connector bag: 2 g | INTRAVENOUS | Stop: 2021-10-22

## 2021-10-11 MED ADMIN — potassium chloride (KLOR-CON) CR tablet 40 mEq: 40 meq | ORAL | @ 02:00:00 | Stop: 2021-10-10

## 2021-10-11 MED ADMIN — levothyroxine (SYNTHROID) tablet 25 mcg: 25 ug | ORAL | @ 11:00:00

## 2021-10-11 MED ADMIN — loperamide (IMODIUM) capsule 2 mg: 2 mg | ORAL | @ 02:00:00

## 2021-10-11 MED ADMIN — nafcillin (NALLPEN) 2 g in sodium chloride 0.9 % (NS) 100 mL IVPB-connector bag: 2 g | INTRAVENOUS | @ 02:00:00 | Stop: 2021-10-22

## 2021-10-11 MED ADMIN — atorvastatin (LIPITOR) tablet 10 mg: 10 mg | ORAL | @ 02:00:00

## 2021-10-11 MED ADMIN — ondansetron (ZOFRAN) injection 4 mg: 4 mg | INTRAVENOUS | @ 11:00:00

## 2021-10-11 MED ADMIN — famotidine (PEPCID) tablet 40 mg: 40 mg | ORAL | @ 02:00:00

## 2021-10-11 MED ADMIN — nafcillin (NALLPEN) 2 g in sodium chloride 0.9 % (NS) 100 mL IVPB-connector bag: 2 g | INTRAVENOUS | @ 23:00:00 | Stop: 2021-10-22

## 2021-10-11 MED ADMIN — enoxaparin (LOVENOX) syringe 40 mg: 40 mg | SUBCUTANEOUS | @ 02:00:00

## 2021-10-11 MED ADMIN — gabapentin (NEURONTIN) capsule 300 mg: 300 mg | ORAL | @ 02:00:00

## 2021-10-11 MED ADMIN — tiZANidine (ZANAFLEX) tablet 4 mg: 4 mg | ORAL | @ 02:00:00

## 2021-10-11 MED ADMIN — insulin lispro (HumaLOG) injection 0-20 Units: 0-20 [IU] | SUBCUTANEOUS | @ 23:00:00

## 2021-10-11 MED ADMIN — nafcillin (NALLPEN) 2 g in sodium chloride 0.9 % (NS) 100 mL IVPB-connector bag: 2 g | INTRAVENOUS | @ 08:00:00 | Stop: 2021-10-22

## 2021-10-11 NOTE — Unmapped (Signed)
No acute events.  Prns given per parameter.  Pt tolerated abx.  Will cont to monitor.  Continue pulse ox.  Denies pain.  Will cont to monitor.

## 2021-10-11 NOTE — Unmapped (Signed)
Pt has no CP distress. Denies any pain or discomfort. ABT continued with adverse reaction. FS= 185 and 132. Pt ambulates in the room and in the hallway. Pt reports no diabetes education need but  given Consistent Carb Menu options.   Problem: Adult Inpatient Plan of Care  Goal: Plan of Care Review  10/10/2021 1630 by Albertha Ghee, RN  Outcome: Ongoing - Unchanged  10/10/2021 1628 by Albertha Ghee, RN  Outcome: Ongoing - Unchanged  Goal: Patient-Specific Goal (Individualized)  10/10/2021 1630 by Albertha Ghee, RN  Outcome: Ongoing - Unchanged  Flowsheets (Taken 10/10/2021 1630)  Anxieties, Fears or Concerns: Pt will adhere to  10/10/2021 1628 by Albertha Ghee, RN  Outcome: Ongoing - Unchanged  Flowsheets (Taken 10/10/2021 1628)  Patient-Specific Goals (Include Timeframe): pT FS  Goal: Absence of Hospital-Acquired Illness or Injury  10/10/2021 1630 by Albertha Ghee, RN  Outcome: Ongoing - Unchanged  10/10/2021 1628 by Albertha Ghee, RN  Outcome: Ongoing - Unchanged  Intervention: Prevent and Manage VTE (Venous Thromboembolism) Risk  Recent Flowsheet Documentation  Taken 10/10/2021 0901 by Albertha Ghee, RN  VTE Prevention/Management:  ??? ambulation promoted  ??? anticoagulant therapy  Goal: Optimal Comfort and Wellbeing  10/10/2021 1630 by Albertha Ghee, RN  Outcome: Ongoing - Unchanged  10/10/2021 1628 by Albertha Ghee, RN  Outcome: Ongoing - Unchanged  Goal: Readiness for Transition of Care  10/10/2021 1630 by Albertha Ghee, RN  Outcome: Ongoing - Unchanged  10/10/2021 1628 by Albertha Ghee, RN  Outcome: Ongoing - Unchanged  Goal: Rounds/Family Conference  10/10/2021 1630 by Albertha Ghee, RN  Outcome: Ongoing - Unchanged  10/10/2021 1628 by Albertha Ghee, RN  Outcome: Ongoing - Unchanged     Problem: Diabetes Comorbidity  Goal: Blood Glucose Level Within Targeted Range  10/10/2021 1630 by Albertha Ghee, RN  Outcome: Ongoing - Unchanged  10/10/2021 1628 by Albertha Ghee, RN  Outcome: Ongoing - Unchanged     Problem: Hypertension Comorbidity  Goal: Blood Pressure in Desired Range  10/10/2021 1630 by Albertha Ghee, RN  Outcome: Ongoing - Unchanged  10/10/2021 1628 by Albertha Ghee, RN  Outcome: Ongoing - Unchanged

## 2021-10-11 NOTE — Unmapped (Signed)
Infectious Disease ([facility]) Progress Note    Assessment & Plan:   Nina Mitchell is a 50 y.o. female with a PMHx of HTN, T2DM, HLD, GERD, hypothyroidism, depression/anxiety, migraines, and recent diagnosis of stage IIIb (Z6X0R) left-sided colon adenocarcinoma (October 2022) s/p open sigmoid colectomy (09/05/21) and recent RIJ chest port placement (09/29/21) who presented to [facility] ED on 11/15 due to fevers and emesis.     Principal Problem:    Fever of undetermined origin  Active Problems:    Mild intermittent asthma without complication    Gastroesophageal reflux disease without esophagitis    Situational mixed anxiety and depressive disorder    Intractable migraine without aura and without status migrainosus    Disease of thyroid gland    Obstructive sleep apnea syndrome    Depression    Acute bilateral low back pain without sciatica    HTN (hypertension)    Anxiety    Adenocarcinoma of sigmoid colon (CMS-HCC)  Resolved Problems:    * No resolved hospital problems. *      Fever on unknown origin - port infection  Presented with discomfort in R neck, mild redness/tenderness at port site, fever, chills, N/V, body aches, cough, sore throat for 1 day. Tmax 103.58F thus far. CXR and ECG unremarkable. Bedside POCUS showed no discrete fluid collection to the line of the port. S/p sepsis fluids, cefepime, vanc in ED. Low suspicion for endocarditis, not proceeding with echo at this time.  Port pulled on 11/16 after discussion with oncology.   -BCx with MSSA, nafcillin for 14d total of abx (EED 11/30), planning home infusion after PICC placement, will remain in place for her first chemotherapy  -Cefazolin can be considered if not tolerating nafcillin   -CBC with diff daily  -BCx negative for 24 hours, 11/17, will get PICC after 48 hr negative  -IV Zofran 4mg  for nausea, Phenergan and Ativan as backup plans  -Tylenol + Toradol for headaches, PRN rizatriptan if migraine-like   -Oncology c/s'd, in agreement with plan.  -Continue Famotidine to 40 BID and add Loperamide for Stomach upset and diarrhea   ??  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 Received port on 11/07. Was set to start capecitabine this week but was not able to I/s/o new sx.   -Oncology c/s'd, plan to adjust chemotherapy timeline outpatient with Dr. Rozell Searing  -Plan to d/c with PICC for abx and can reevaluate access at f/u with onc    Chronic Problems:  T2DM: Atorvastatin 10 mg, SSI   Anxiety - Depression: Clonazepam 0.5 mg daily PRN, escitalopram oxalate 20 mg daily  GERD: Famotidine 20 daily   Back pain: Gabapentin 300 nightly, tizanidine 4 mg PRN   HTN: Holding BP meds for now given the possible infection, will follow up in AM   Hypothyroidism: Synthroid 25 mcg PO daily     Daily Checklist:  Diet: Regular Diet  DVT PPx: Lovenox 40mg  q24h  Electrolytes: Replete Potassium to >/= 3.6 and Magnesium to >/= 1.8  Code Status: Full Code  Dispo: Floor    Team Contact Information:   Primary Team: Infectious Disease ([facility])  Primary Resident: Yehuda Savannah, MD  Resident's Pager: [phone] (Infect Disease Intern - [name])    Interval History:   No acute events overnight.   Patient does not complain of pain, nausea, and vomiting. She had less loose stools.     ROS: endorses nausea. Denies chest or abdominal pain, headaches,  difficulty breathing, pain/irritation at port site.     Objective:   Temp:  [36.7 ??C (98.1 ??F)-37.5 ??C (99.5 ??F)] 36.7 ??C (98.1 ??F)  Heart Rate:  [77-78] 77  Resp:  [18-19] 19  BP: (142-146)/(87-90) 145/87  SpO2:  [94 %-100 %] 97 %    Gen: in no NAD, answers questions appropriately  Eyes: sclera anicteric  HENT: atraumatic, MMM  Heart: RRR, S1, S2, no M/R/G, no chest wall tenderness, cap refill < 2s  Lungs: CTAB, no crackles or wheezes, no use of accessory muscles, normal WOB on RA   Abdomen: Normoactive bowel sounds, soft, NTND, no rebound/guarding  Extremities: no clubbing, cyanosis, or edema in the BLEs  Skin: Stitches and dermabond in place at prior port site with only mild surrounding erythema  Psych: Alert, oriented, appropriate mood and affect    Labs/Studies: Labs and Studies from the last 24hrs per EMR and Reviewed    Yehuda Savannah, MD  PGY1, Internal Medicine

## 2021-10-11 NOTE — Unmapped (Signed)
""  VENOUS ACCESS TEAM PROCEDURE    Order was placed for a \""PIV by Venous Access Team (VAT)\"".  Patient was assessed at bedside for placement of a PIV. PPE were donned per protocol.  Access was obtained. Blood return noted.  Dressing intact and device well secured.  Flushed with normal saline.  See LDA for details.  Pt advised to inform RN of any s/s of discomfort at the PIV site.    Workup / Procedure Time:  30 minutes       Care RN was notified.       Thank you,     Dorthy Magnussen RN Venous Access Team""

## 2021-10-12 LAB — CBC W/ AUTO DIFF
BASOPHILS ABSOLUTE COUNT: 0.1 10*9/L (ref 0.0–0.1)
BASOPHILS RELATIVE PERCENT: 1 %
EOSINOPHILS ABSOLUTE COUNT: 0.3 10*9/L (ref 0.0–0.5)
EOSINOPHILS RELATIVE PERCENT: 5.6 %
HEMATOCRIT: 33.5 % — ABNORMAL LOW (ref 34.0–44.0)
HEMOGLOBIN: 11 g/dL — ABNORMAL LOW (ref 11.3–14.9)
LYMPHOCYTES ABSOLUTE COUNT: 2.3 10*9/L (ref 1.1–3.6)
LYMPHOCYTES RELATIVE PERCENT: 41 %
MEAN CORPUSCULAR HEMOGLOBIN CONC: 32.7 g/dL (ref 32.0–36.0)
MEAN CORPUSCULAR HEMOGLOBIN: 25.9 pg (ref 25.9–32.4)
MEAN CORPUSCULAR VOLUME: 79.2 fL (ref 77.6–95.7)
MEAN PLATELET VOLUME: 7.5 fL (ref 6.8–10.7)
MONOCYTES ABSOLUTE COUNT: 0.6 10*9/L (ref 0.3–0.8)
MONOCYTES RELATIVE PERCENT: 11.2 %
NEUTROPHILS ABSOLUTE COUNT: 2.3 10*9/L (ref 1.8–7.8)
NEUTROPHILS RELATIVE PERCENT: 41.2 %
PLATELET COUNT: 274 10*9/L (ref 150–450)
RED BLOOD CELL COUNT: 4.24 10*12/L (ref 3.95–5.13)
RED CELL DISTRIBUTION WIDTH: 16.9 % — ABNORMAL HIGH (ref 12.2–15.2)
WBC ADJUSTED: 5.7 10*9/L (ref 3.6–11.2)

## 2021-10-12 LAB — COMPREHENSIVE METABOLIC PANEL
ALBUMIN: 3.2 g/dL — ABNORMAL LOW (ref 3.4–5.0)
ALKALINE PHOSPHATASE: 85 U/L (ref 46–116)
ALT (SGPT): 75 U/L — ABNORMAL HIGH (ref 10–49)
ANION GAP: 9 mmol/L (ref 5–14)
AST (SGOT): 73 U/L — ABNORMAL HIGH (ref <=34)
BILIRUBIN TOTAL: 0.4 mg/dL (ref 0.3–1.2)
BLOOD UREA NITROGEN: 6 mg/dL — ABNORMAL LOW (ref 9–23)
BUN / CREAT RATIO: 12
CALCIUM: 8.8 mg/dL (ref 8.7–10.4)
CHLORIDE: 104 mmol/L (ref 98–107)
CO2: 27 mmol/L (ref 20.0–31.0)
CREATININE: 0.49 mg/dL — ABNORMAL LOW
EGFR CKD-EPI (2021) FEMALE: >90 mL/min/{1.73_m2} (ref >=60)
GLUCOSE RANDOM: 172 mg/dL (ref 70–179)
POTASSIUM: 3.6 mmol/L (ref 3.4–4.8)
PROTEIN TOTAL: 6.8 g/dL (ref 5.7–8.2)
SODIUM: 140 mmol/L (ref 135–145)

## 2021-10-12 MED ADMIN — ketorolac (TORADOL) injection 15 mg: 15 mg | INTRAVENOUS | @ 10:00:00 | Stop: 2021-10-13

## 2021-10-12 MED ADMIN — escitalopram oxalate (LEXAPRO) tablet 20 mg: 20 mg | ORAL | @ 14:00:00

## 2021-10-12 MED ADMIN — nafcillin (NALLPEN) 2 g in sodium chloride 0.9 % (NS) 100 mL IVPB-connector bag: 2 g | INTRAVENOUS | @ 07:00:00 | Stop: 2021-10-22

## 2021-10-12 MED ADMIN — nafcillin (NALLPEN) 2 g in sodium chloride 0.9 % (NS) 100 mL IVPB-connector bag: 2 g | INTRAVENOUS | @ 23:00:00 | Stop: 2021-10-22

## 2021-10-12 MED ADMIN — nafcillin (NALLPEN) 2 g in sodium chloride 0.9 % (NS) 100 mL IVPB-connector bag: 2 g | INTRAVENOUS | @ 10:00:00 | Stop: 2021-10-22

## 2021-10-12 MED ADMIN — nafcillin (NALLPEN) 2 g in sodium chloride 0.9 % (NS) 100 mL IVPB-connector bag: 2 g | INTRAVENOUS | @ 19:00:00 | Stop: 2021-10-22

## 2021-10-12 MED ADMIN — insulin lispro (HumaLOG) injection 0-20 Units: 0-20 [IU] | SUBCUTANEOUS | @ 14:00:00

## 2021-10-12 MED ADMIN — enoxaparin (LOVENOX) syringe 40 mg: 40 mg | SUBCUTANEOUS | @ 14:00:00

## 2021-10-12 MED ADMIN — levothyroxine (SYNTHROID) tablet 25 mcg: 25 ug | ORAL | @ 10:00:00

## 2021-10-12 MED ADMIN — famotidine (PEPCID) tablet 40 mg: 40 mg | ORAL | @ 02:00:00

## 2021-10-12 MED ADMIN — clonazePAM (KlonoPIN) tablet 0.5 mg: .5 mg | ORAL | @ 03:00:00

## 2021-10-12 MED ADMIN — tiZANidine (ZANAFLEX) tablet 4 mg: 4 mg | ORAL | @ 02:00:00

## 2021-10-12 MED ADMIN — atorvastatin (LIPITOR) tablet 10 mg: 10 mg | ORAL | @ 02:00:00

## 2021-10-12 MED ADMIN — nafcillin (NALLPEN) 2 g in sodium chloride 0.9 % (NS) 100 mL IVPB-connector bag: 2 g | INTRAVENOUS | @ 03:00:00 | Stop: 2021-10-22

## 2021-10-12 MED ADMIN — famotidine (PEPCID) tablet 40 mg: 40 mg | ORAL | @ 14:00:00

## 2021-10-12 MED ADMIN — gabapentin (NEURONTIN) capsule 300 mg: 300 mg | ORAL | @ 02:00:00

## 2021-10-12 MED ADMIN — enoxaparin (LOVENOX) syringe 40 mg: 40 mg | SUBCUTANEOUS | @ 02:00:00

## 2021-10-12 MED ADMIN — nafcillin (NALLPEN) 2 g in sodium chloride 0.9 % (NS) 100 mL IVPB-connector bag: 2 g | INTRAVENOUS | @ 16:00:00 | Stop: 2021-10-22

## 2021-10-12 MED ADMIN — sodium chloride (NS) 0.9 % flush 10 mL: 10 mL | INTRAVENOUS | @ 19:00:00

## 2021-10-12 MED ADMIN — insulin lispro (HumaLOG) injection 0-20 Units: 0-20 [IU] | SUBCUTANEOUS | @ 02:00:00

## 2021-10-12 MED ADMIN — lisinopriL (PRINIVIL,ZESTRIL) tablet 2.5 mg: 2.5 mg | ORAL | @ 21:00:00

## 2021-10-12 NOTE — Unmapped (Signed)
""  VENOUS ACCESS TEAM PROCEDURE    Order was placed for a \""PIV by Venous Access Team (VAT)\"".  Patient was assessed at bedside for placement of a PIV. PPE were donned per protocol.  Access was obtained. Blood return noted.  Dressing intact and device well secured.  Flushed with normal saline.  See LDA for details.  Pt advised to inform RN of any s/s of discomfort at the PIV site.    Workup / Procedure Time:  30 minutes      RN was notified.       Thank you,     Briggett Tuccillo RN Venous Access Team""

## 2021-10-12 NOTE — Unmapped (Signed)
Pt has no CP distress. Pt c/o migraine headache, given toradol IV with relief. ABT continued without adverse reaction. Pt ambulates outside unit. No unusual signs and symptoms noted.   Problem: Adult Inpatient Plan of Care  Goal: Plan of Care Review  Outcome: Ongoing - Unchanged  Goal: Patient-Specific Goal (Individualized)  Outcome: Ongoing - Unchanged  Goal: Absence of Hospital-Acquired Illness or Injury  Outcome: Ongoing - Unchanged  Intervention: Identify and Manage Fall Risk  Recent Flowsheet Documentation  Taken 10/11/2021 0800 by Albertha Ghee, RN  Safety Interventions: low bed  Intervention: Prevent and Manage VTE (Venous Thromboembolism) Risk  Recent Flowsheet Documentation  Taken 10/11/2021 0830 by Albertha Ghee, RN  VTE Prevention/Management: ambulation promoted  Goal: Optimal Comfort and Wellbeing  Outcome: Ongoing - Unchanged  Goal: Readiness for Transition of Care  Outcome: Ongoing - Unchanged  Goal: Rounds/Family Conference  Outcome: Ongoing - Unchanged     Problem: Diabetes Comorbidity  Goal: Blood Glucose Level Within Targeted Range  Outcome: Ongoing - Unchanged     Problem: Hypertension Comorbidity  Goal: Blood Pressure in Desired Range  Outcome: Ongoing - Unchanged

## 2021-10-12 NOTE — Unmapped (Signed)
Pt a/ox4.  Prn given per [facility].  RA.  Pulse ox monitoring while asleep.  No falls.  POC continues.

## 2021-10-12 NOTE — Unmapped (Signed)
Pt had a rough night sleeping.  PRns given to alleviate pt's worries and pain.  Tolerated abx.  Will cont to monitor.   Problem: Adult Inpatient Plan of Care  Goal: Plan of Care Review  10/12/2021 0652 by Golden Pop, RN  Outcome: Ongoing - Unchanged  10/12/2021 0503 by Golden Pop, RN  Outcome: Ongoing - Unchanged  Goal: Patient-Specific Goal (Individualized)  10/12/2021 0652 by Golden Pop, RN  Outcome: Ongoing - Unchanged  10/12/2021 0503 by Golden Pop, RN  Outcome: Ongoing - Unchanged  Goal: Absence of Hospital-Acquired Illness or Injury  10/12/2021 0652 by Golden Pop, RN  Outcome: Ongoing - Unchanged  10/12/2021 0503 by Golden Pop, RN  Outcome: Ongoing - Unchanged  Goal: Optimal Comfort and Wellbeing  10/12/2021 0652 by Golden Pop, RN  Outcome: Ongoing - Unchanged  10/12/2021 0503 by Golden Pop, RN  Outcome: Ongoing - Unchanged  Goal: Readiness for Transition of Care  10/12/2021 0652 by Golden Pop, RN  Outcome: Ongoing - Unchanged  10/12/2021 0503 by Golden Pop, RN  Outcome: Ongoing - Unchanged  Goal: Rounds/Family Conference  10/12/2021 0652 by Golden Pop, RN  Outcome: Ongoing - Unchanged  10/12/2021 0503 by Golden Pop, RN  Outcome: Ongoing - Unchanged

## 2021-10-12 NOTE — Unmapped (Signed)
PICC LINE INSERTION PROCEDURE NOTE    Indications:  Antibiotic Therapy    Consent/Time Out:    Risks, benefits and alternatives discussed with patient and guardian.  Written Consent was obtained prior to the procedure and is detailed in the medical record.  Prior to the start of the procedure, a time out was taken and the identity of the patient was confirmed via name, medical record number and  date of birth.  The availability of the correct equipment was verified.    Procedure Details:  The vein was identified and measured for appropriate catheter length. Maximum sterile techniques including PPE were utilized per protocol.  Sterile field prepared with necessary supplies and equipment. Insertion site was prepped with chlorhexidine solution and allowed to dry. Lidocaine 2 mL subcutaneously and intradermally administered to insertion site.  The catheter was primed with normal saline.  A 4 FR Single lumen was inserted to the R Basilic vein with 1 insertion attempt(s).  Catheter aspirated, 2 mL blood return present.  The catheter was then flushed with 20 mL of normal saline.   Insertion site cleansed, and sterile dressing applied per manufacturer guidelines. The Central Line Checklist was referenced.  The catheter was inserted without difficulty by Caffie Pinto RN and assisted by Gillie Manners RN.    Findings:  Manufacturer:  Bard  Lot #:  [id number]  CT Injectable (power):  Yes  Arm Circumference:  37  cm  Total catheter length:  43 cm.    External length:  1 cm.  Catheter trimmed:  yes  Port reserved:  No    Vein Size:   Right arm basilic vein compressible:  Yes, measurement:   (see vein image)    Tip Placement Verification:   [employer] and Sherlock    Workup Time:    30 minutes    Recommendations:  PICC brochure given to patient with teaching instructions  Refer to head of bed sign      See images below:

## 2021-10-12 NOTE — Unmapped (Signed)
PICC LINE TRIAGE NOTE    The Venous Access Team has received an order for PICC placement.  This patient has been triaged and  VAT will attempt bedside placement as schedule permits .      Thank You,    Zackarie Chason RN Venous Access Team [phone]      Workup Time:  30 minutes

## 2021-10-12 NOTE — Unmapped (Signed)
Infectious Disease (MEDK) Progress Note    Assessment & Plan:   Nina Mitchell is a 50 y.o. female with a PMHx of HTN, T2DM, HLD, GERD, hypothyroidism, depression/anxiety, migraines, and recent diagnosis of stage IIIb (Z6X0R) left-sided colon adenocarcinoma (October 2022) s/p open sigmoid colectomy (09/05/21) and recent RIJ chest port placement (09/29/21) who presented to [facility] ED on 11/15 due to fevers and emesis, found to have MSSA bacteremia.    Principal Problem:    Fever of undetermined origin  Active Problems:    Mild intermittent asthma without complication    Gastroesophageal reflux disease without esophagitis    Situational mixed anxiety and depressive disorder    Intractable migraine without aura and without status migrainosus    Disease of thyroid gland    Obstructive sleep apnea syndrome    Depression    Acute bilateral low back pain without sciatica    HTN (hypertension)    Anxiety    Adenocarcinoma of sigmoid colon (CMS-HCC)  Resolved Problems:    * No resolved hospital problems. *    MSSA bacteremia 2/2 port infection  Presented with discomfort in R neck, mild redness/tenderness at port site, fever, chills, N/V, and body aches. Tmax 103.68F. 2/2 BCx 11/15 with MSSA. Port pulled on 11/16 after discussion with oncology.  - BCx 11/17 NGTD  - s/p PICC placement 11/20 (to remain in place for IV antibiotic course and chemotherapy)  - Nafcillin 2h q4h x 14d after port pulled (11/16 - 11/30)  - CBC with diff daily  - IV Zofran 4mg  for nausea, Phenergan and Ativan as backup plans  -Tylenol + Toradol for headaches, PRN rizatriptan if migraine-like   - Loperamide prn for antibiotic associated diarrhea  ??  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 Received port on 11/07. Was set to start capecitabine this week but was not able to I/s/o new sx.   - Oncology c/s'd, plan to adjust chemotherapy timeline outpatient with Dr. Rozell Searing  -PICC to remain in place for initiation of chemotherapy    Mild transaminase elevation  Transaminases mildly elevated during this hospitalization, as well as dating back to 2018.  Benign abdominal exam and patient reports CT without evidence of metastases.  Given body habitus, patient may have mild steatohepatitis.  - f/u RUQ [place]    Chronic Problems:  T2DM: Atorvastatin 10 mg, SSI   Anxiety - Depression: Clonazepam 0.5 mg daily PRN, escitalopram oxalate 20 mg daily  GERD: Famotidine 40mg  daily   Back pain: Gabapentin 300 nightly, tizanidine 4 mg PRN   HTN: Home lisinopril 2.5 mg daily (restarted 11/20)  Hypothyroidism: Synthroid 25 mcg PO daily     Daily Checklist:  Diet: Regular Diet  DVT PPx: Lovenox 40mg  q24h  Electrolytes: Replete Potassium to >/= 3.6 and Magnesium to >/= 1.8  Code Status: Full Code  Dispo: Floor    Team Contact Information:   Primary Team: Infectious Disease (MEDK)  Primary Resident: Charlynn Court, MD  Resident's Pager: [phone] (Infect Disease Intern - [name])    Interval History:   - NAEO  - Bad night's sleep, required PRN clonazepam and ketorolac  - No chest pain, shortness of breath, or abdominal pain this morning    Objective:   Temp:  [36.4 ??C (97.5 ??F)-37.2 ??C (99 ??F)] 36.4 ??C (97.5 ??F)  Heart Rate:  [77-87] 87  Resp:  [18] 18  BP: (149-169)/(86-96) 150/90  SpO2:  [95 %-97 %] 97 %  Gen: in no NAD, answers questions appropriately  Eyes: sclera anicteric  HENT: atraumatic, MMM  Heart: RRR, S1, S2, no M/R/G, no chest wall tenderness, cap refill < 2s  Lungs: CTAB, no crackles or wheezes, no use of accessory muscles, normal WOB on RA   Abdomen: soft, NTND, no rebound/guarding  Extremities: no clubbing, cyanosis, or edema in the BLEs  Skin: Stitches and dermabond in place at prior port site with only mild surrounding erythema not concerning for infection  Psych: Alert, oriented, appropriate mood and affect    Labs/Studies: Labs and Studies from the last 24hrs per EMR and Reviewed    Charlynn Court, MD  Med-Peds PGY-2  10/12/21 2:14 PM

## 2021-10-13 LAB — CBC W/ AUTO DIFF
BASOPHILS ABSOLUTE COUNT: 0.1 10*9/L (ref 0.0–0.1)
BASOPHILS RELATIVE PERCENT: 1.2 %
EOSINOPHILS ABSOLUTE COUNT: 0.4 10*9/L (ref 0.0–0.5)
EOSINOPHILS RELATIVE PERCENT: 5.7 %
HEMATOCRIT: 34.1 % (ref 34.0–44.0)
HEMOGLOBIN: 11.2 g/dL — ABNORMAL LOW (ref 11.3–14.9)
LYMPHOCYTES ABSOLUTE COUNT: 2.4 10*9/L (ref 1.1–3.6)
LYMPHOCYTES RELATIVE PERCENT: 35.2 %
MEAN CORPUSCULAR HEMOGLOBIN CONC: 32.9 g/dL (ref 32.0–36.0)
MEAN CORPUSCULAR HEMOGLOBIN: 26.1 pg (ref 25.9–32.4)
MEAN CORPUSCULAR VOLUME: 79.2 fL (ref 77.6–95.7)
MEAN PLATELET VOLUME: 7.2 fL (ref 6.8–10.7)
MONOCYTES ABSOLUTE COUNT: 0.7 10*9/L (ref 0.3–0.8)
MONOCYTES RELATIVE PERCENT: 9.8 %
NEUTROPHILS ABSOLUTE COUNT: 3.3 10*9/L (ref 1.8–7.8)
NEUTROPHILS RELATIVE PERCENT: 48.1 %
PLATELET COUNT: 337 10*9/L (ref 150–450)
RED BLOOD CELL COUNT: 4.3 10*12/L (ref 3.95–5.13)
RED CELL DISTRIBUTION WIDTH: 16.8 % — ABNORMAL HIGH (ref 12.2–15.2)
WBC ADJUSTED: 6.8 10*9/L (ref 3.6–11.2)

## 2021-10-13 LAB — COMPREHENSIVE METABOLIC PANEL
ALBUMIN: 3.3 g/dL — ABNORMAL LOW (ref 3.4–5.0)
ALKALINE PHOSPHATASE: 84 U/L (ref 46–116)
ALT (SGPT): 70 U/L — ABNORMAL HIGH (ref 10–49)
ANION GAP: 5 mmol/L (ref 5–14)
AST (SGOT): 52 U/L — ABNORMAL HIGH (ref <=34)
BILIRUBIN TOTAL: 0.3 mg/dL (ref 0.3–1.2)
BLOOD UREA NITROGEN: 6 mg/dL — ABNORMAL LOW (ref 9–23)
BUN / CREAT RATIO: 12
CALCIUM: 9 mg/dL (ref 8.7–10.4)
CHLORIDE: 105 mmol/L (ref 98–107)
CO2: 30 mmol/L (ref 20.0–31.0)
CREATININE: 0.51 mg/dL — ABNORMAL LOW
EGFR CKD-EPI (2021) FEMALE: >90 mL/min/{1.73_m2} (ref >=60)
GLUCOSE RANDOM: 189 mg/dL — ABNORMAL HIGH (ref 70–179)
POTASSIUM: 3.5 mmol/L (ref 3.4–4.8)
PROTEIN TOTAL: 7.1 g/dL (ref 5.7–8.2)
SODIUM: 140 mmol/L (ref 135–145)

## 2021-10-13 MED ORDER — FAMOTIDINE 40 MG TABLET
ORAL_TABLET | Freq: Two times a day (BID) | ORAL | 0 refills | 30.00000 days | Status: CP
Start: 2021-10-13 — End: 2021-11-12

## 2021-10-13 MED ORDER — NAFCILLIN IVPB 2 G CONNECTOR BAG
INTRAVENOUS | 0 refills | 9 days | Status: CN
Start: 2021-10-13 — End: 2021-10-22

## 2021-10-13 MED ADMIN — nafcillin (NALLPEN) 2 g in sodium chloride 0.9 % (NS) 100 mL IVPB-connector bag: 2 g | INTRAVENOUS | @ 08:00:00 | Stop: 2021-10-13

## 2021-10-13 MED ADMIN — sodium chloride (NS) 0.9 % flush 10 mL: 10 mL | INTRAVENOUS | @ 11:00:00 | Stop: 2021-10-13

## 2021-10-13 MED ADMIN — gabapentin (NEURONTIN) capsule 300 mg: 300 mg | ORAL | @ 01:00:00

## 2021-10-13 MED ADMIN — tiZANidine (ZANAFLEX) tablet 4 mg: 4 mg | ORAL | @ 02:00:00

## 2021-10-13 MED ADMIN — nafcillin (NALLPEN) 2 g in sodium chloride 0.9 % (NS) 100 mL IVPB-connector bag: 2 g | INTRAVENOUS | @ 19:00:00 | Stop: 2021-10-13

## 2021-10-13 MED ADMIN — famotidine (PEPCID) tablet 40 mg: 40 mg | ORAL | @ 01:00:00

## 2021-10-13 MED ADMIN — atorvastatin (LIPITOR) tablet 10 mg: 10 mg | ORAL | @ 01:00:00

## 2021-10-13 MED ADMIN — escitalopram oxalate (LEXAPRO) tablet 20 mg: 20 mg | ORAL | @ 14:00:00 | Stop: 2021-10-13

## 2021-10-13 MED ADMIN — sodium chloride (NS) 0.9 % flush 10 mL: 10 mL | INTRAVENOUS | @ 17:00:00 | Stop: 2021-10-13

## 2021-10-13 MED ADMIN — nafcillin (NALLPEN) 2 g in sodium chloride 0.9 % (NS) 100 mL IVPB-connector bag: 2 g | INTRAVENOUS | @ 03:00:00 | Stop: 2021-10-22

## 2021-10-13 MED ADMIN — sodium chloride (NS) 0.9 % flush 10 mL: 10 mL | INTRAVENOUS | @ 01:00:00

## 2021-10-13 MED ADMIN — nafcillin (NALLPEN) 2 g in sodium chloride 0.9 % (NS) 100 mL IVPB-connector bag: 2 g | INTRAVENOUS | @ 11:00:00 | Stop: 2021-10-13

## 2021-10-13 MED ADMIN — enoxaparin (LOVENOX) syringe 40 mg: 40 mg | SUBCUTANEOUS | @ 01:00:00

## 2021-10-13 MED ADMIN — famotidine (PEPCID) tablet 40 mg: 40 mg | ORAL | @ 14:00:00 | Stop: 2021-10-13

## 2021-10-13 MED ADMIN — enoxaparin (LOVENOX) syringe 40 mg: 40 mg | SUBCUTANEOUS | @ 14:00:00 | Stop: 2021-10-13

## 2021-10-13 MED ADMIN — insulin lispro (HumaLOG) injection 0-20 Units: 0-20 [IU] | SUBCUTANEOUS | @ 14:00:00 | Stop: 2021-10-13

## 2021-10-13 MED ADMIN — insulin lispro (HumaLOG) injection 0-20 Units: 0-20 [IU] | SUBCUTANEOUS | @ 17:00:00 | Stop: 2021-10-13

## 2021-10-13 MED ADMIN — insulin lispro (HumaLOG) injection 0-20 Units: 0-20 [IU] | SUBCUTANEOUS | @ 01:00:00

## 2021-10-13 MED ADMIN — lisinopriL (PRINIVIL,ZESTRIL) tablet 2.5 mg: 2.5 mg | ORAL | @ 14:00:00 | Stop: 2021-10-13

## 2021-10-13 MED ADMIN — levothyroxine (SYNTHROID) tablet 25 mcg: 25 ug | ORAL | @ 11:00:00 | Stop: 2021-10-13

## 2021-10-13 MED ADMIN — insulin lispro (HumaLOG) injection 0-20 Units: 0-20 [IU] | SUBCUTANEOUS | @ 22:00:00 | Stop: 2021-10-13

## 2021-10-13 MED ADMIN — nafcillin (NALLPEN) 2 g in sodium chloride 0.9 % (NS) 100 mL IVPB-connector bag: 2 g | INTRAVENOUS | @ 15:00:00 | Stop: 2021-10-13

## 2021-10-13 NOTE — Unmapped (Signed)
[facility]: [phone]    [facility] Care: [phone]    Please call with any questions or concerns.

## 2021-10-13 NOTE — Unmapped (Signed)
Pt is alert and oriented x4. Pt had PICC placed this shift. Pt tolerated diet without any issues to note. Abdominal US ordered and pt instructed to remain NPO for this. Pt did go downstairs with family this shift. Continue with plan of care.     Problem: Adult Inpatient Plan of Care  Goal: Plan of Care Review  Outcome: Ongoing - Unchanged  Goal: Patient-Specific Goal (Individualized)  Outcome: Ongoing - Unchanged  Goal: Absence of Hospital-Acquired Illness or Injury  Outcome: Ongoing - Unchanged  Intervention: Identify and Manage Fall Risk  Recent Flowsheet Documentation  Taken 10/12/2021 0800 by Oletha Blend, RN  Safety Interventions:   low bed   lighting adjusted for tasks/safety   nonskid shoes/slippers when out of bed  Intervention: Prevent and Manage VTE (Venous Thromboembolism) Risk  Recent Flowsheet Documentation  Taken 10/12/2021 0800 by Oletha Blend, RN  Activity Management: activity adjusted per tolerance  Goal: Optimal Comfort and Wellbeing  Outcome: Ongoing - Unchanged  Goal: Readiness for Transition of Care  Outcome: Ongoing - Unchanged  Goal: Rounds/Family Conference  Outcome: Ongoing - Unchanged     Problem: Diabetes Comorbidity  Goal: Blood Glucose Level Within Targeted Range  Outcome: Ongoing - Unchanged     Problem: Hypertension Comorbidity  Goal: Blood Pressure in Desired Range  Outcome: Ongoing - Unchanged

## 2021-10-13 NOTE — Unmapped (Signed)
No acute needs.  Pt tolerated scheduled meds.  No falls.  Prns given.  POC continues.      Problem: Adult Inpatient Plan of Care  Goal: Plan of Care Review  Outcome: Ongoing - Unchanged  Goal: Patient-Specific Goal (Individualized)  Outcome: Ongoing - Unchanged  Goal: Absence of Hospital-Acquired Illness or Injury  Outcome: Ongoing - Unchanged  Goal: Optimal Comfort and Wellbeing  Outcome: Ongoing - Unchanged  Goal: Readiness for Transition of Care  Outcome: Ongoing - Unchanged  Goal: Rounds/Family Conference  Outcome: Ongoing - Unchanged     Problem: Diabetes Comorbidity  Goal: Blood Glucose Level Within Targeted Range  Outcome: Ongoing - Unchanged

## 2021-10-13 NOTE — Unmapped (Addendum)
Pharmacist Discharge Note: IV Anti-Infectives  Patient Name: Nina Mitchell   Primary ID Diagnosis/Diagnoses: MSSA bacteremia 2/2 line infection  Relevant cultures and history: Peripheral blood and port culture 11/15 = MSSA; port removed 10/08/21; peripheral blood culture 11/17 = negative      Antimicrobials and Dose: nafcillin 12g IV q24 hrs via continuous infusion  Start date of therapy (day 1 of appropriate therapy): 10/09/21  Anticipated stop date of therapy: 10/22/21 (2 weeks)  Outpatient IV access (if known at time of note writing): PICC **Do not pull PICC when finished, plan to use for chemotherapy**      Requested lab frequency: weekly  Requested labs: CBC with differential, comprehensive metabolic panel    MD responsible: Dr. Susa Griffins (Medical Oncology) Phone: [phone] Fax: [phone]       Rubie Maid, PharmD

## 2021-10-14 NOTE — Unmapped (Signed)
Patient discharged to home. Reviewed discharge instructions, medications, prescriptions, and follow up appointments with patient and family. All questions and concerns were addressed. PICC clean dry and intact. AVS and all belongings with patient.

## 2021-10-20 NOTE — Unmapped (Signed)
[facility] called Shontia  regarding a clinical assessment for capecitabie and to schedule a delivery for the next cycle.  Capox delayed due to Shakerra was inpatient for an infections.  C1D1 rescheduled and planned for 10/24/21.  She has the delivery from 10/06/21 on hand.  [facility] will reschedule the clinical call prior to cycle 2.    Horace Porteous, PharmD  [facility]

## 2021-10-22 DIAGNOSIS — C187 Malignant neoplasm of sigmoid colon: Principal | ICD-10-CM

## 2021-10-22 NOTE — Unmapped (Signed)
Addended by: Fannie Knee on: 10/22/2021 10:05 AM     Modules accepted: Orders

## 2021-10-22 NOTE — Unmapped (Signed)
[facility] GI MEDICAL ONCOLOGY CLINIC NOTE    Encounter Date: 10/24/2021    PCP:   Terrall Laity, PA    Consulting Providers:  Dr. Alden Server [facility]   ____________________________________________________________________  Oncology History:   Diagnosis: sigmoid colon adenocarcinoma  Current Goal of Therapy: adjuvant, curative  - 08/2021 patient underwent EGD and full colonoscopy in setting of generalized abdominal pain and change in bowel habits revealing a mass in sigmoid colon with near total obstruction that was biopsied as moderately differentiated adenocarcinoma. Also had a descending colon tubulovillous adenoma and a tubular adenoma in transverse colon. Stomach biopsy revealed chronic gastritis (no H. Pylori). CEA 1.   - 09/05/21 open sigmoid colectomy with primary anastomosis with pathology revealing moderately differentiated adenocarcinoma invading through MP into pericolonic tissues, negative margins, no PNI/LVI, no tumor deposits or perforation, 3/17 LNs positive for cancer, pMMR. Staged as pT3N1b  - 11/15-11/21/22 admitted with port infection (MSSA bacteremia) treated with nafcillin  - 10/24/21-current CAPOX x 4 cycles     Molecular: pMMR  ____________________________________________________________________  Assessment and Plan:  Nina Mitchell  is a 50 y.o. female who presented for evaluation and recommendations regarding sigmoid colon adenocarcinoma.     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 For her low-risk stage III colon cancer, we discussed adjuvant therapy with CAPOX x 3 months. Labs today all adequate to start treatment.   - C1 CAPOX today  - PICC to come out today after C1; can consider re-insertion of PICC with later cycles if she has oxaliplatin induced vein pain  - Surveillance:              - H&P + CEA: every 3-6 months x 2 years, then every 6 months x 3 years              - Scans: annually, repeat CT chest after adjuvant therapy, full body scans due 06/2022              - Colonoscopy: due 08/2022  ??  Recent MSSA Bacteremia - Port Infection: now s/p several weeks of nafcillin. Finished treatment on 10/23/21. Port has been removed  - removing PICC after C1D1 today     Pulmonary Nodules: multiple pulmonary nodules noted on 09/29/21 scans that were not definitively seen on previous scans (though reports note some nodules in past). We will keep a close eye on these and repeat imaging after finishing adjuvant therapy.  - CT chest after finishing three months of CAPOX    Multiple Sclerosis: managed by local neurologist  ??  T2DM: managed by local PCP. Recently started Ozempic. Hyperglycemic today.   - cont Ozempic  - will give 8U NPH today for hyperglycemia and to hopefully improve post dex hyperglycemia  - managed by PCP    HTN:   - lisinopril and atenolol continued     Supportive Care:  CINV: PRN Zofran and Clonopin for second line     Anxiety and Depression:  - escitalopram 20mg  daily  - Klonopin 0.5mg  PRN    Follow-Up:   RTC in 3 weeks for labs + Allard + C2 CAPOX  RTC in 6 weeks for labs + Latrel Szymczak + C3 CAPOX (scheduled)    This note was created with dictation software. Please excuse any transcription errors.   _____________________________________________________________________  Interval History:  Nina Mitchell  is a 50 y.o. female  who presented for a return visit regarding colon adenocarcinoma.     Since last visit, she presented to [facility] on 10/07/2021 with a port infection and fevers.  Admitted and was found to have MSSA bacteremia, and her antibiotics were narrowed to nafcillin.  Her port was removed and she had a PICC placed prior to discharge.  Discharged on 10/13/2021 to outpatient follow-up.    Today, notes that she is overall doing well and is slowly recovering from her eventful couple of weeks.  States no pain at the port site and the PICC is not a particularly annoying to her.  She feels ready for treatment, however, also not ready. Feeling a bit anxious about the lung nodules, but ok with the plan to watch them closely and repeat scans in a couple of months.     Denies fever, chills, weight changes, mouth sores, chest pain, SOB, cough, abd pain, N/V/D, constipation, swelling, rashes, bruises.     Past Medical History:  HTN  T2DM  HLD  GERD  Hypothyroidism  Mood disorder   Multiples sclerosis  Migraine    Social History:  Never smoker  Rarely consumes alcohol (1-2x per year, mixed drink)    Review of Systems:  A ten-point review of systems was conducted and negative unless stated in HPI.    Physical Exam:  VITALS: BP 135/88  - Pulse 94  - Temp 37.1 ??C (98.8 ??F) (Temporal)  - Resp 16  - Ht 167.6 cm (5' 6)  - Wt (!) 125.5 kg (276 lb 11.2 oz)  - BMI 44.66 kg/m??   CONSTITUTIONAL: in NAD, appears stated age, able to get on exam table unassisted  HEENT: MMM, no oral lesions or exudates, neck supple  CARDIO: normal rate, regular rhythm, no murmurs, port site looks clean and crusted over after removal, PICC in RUE  RESPIRATORY: CTAB b/l, normal WOB on RA, no conversational dyspnea  GI: soft, NT, ND, no appreciable HSM  EXTREMITIES: no LE edema  SKIN: no rashes or eccymoses  NEUROLOGIC: alert and oriented, no gross focal deficits noted   LYMPH: no supraclavicular, axillary, or cervical LAD  PSYCH: appropriate mood and affect, good insight and judgment     Labs:  Reviewed In Epic  WBC 6.9 ANC 3.5  Hemoglobin 11.8 MCV 80  Platelets 479  Sodium 138  Creatinine 0.48  Glucose 322  CMP otherwise unremarkable    Imaging:  I personally reviewed the following imaging studies and my interpretation is:  CT Chest 09/29/21:  - multiple subcentimeter pulmonary nodules that were not obviously seen on previous scans (though some were present)  - multiple sclerotic bone lesions that were present on previous scans

## 2021-10-22 NOTE — Unmapped (Addendum)
Received message from patient, requesting that our office take over management of PICC line and supplies.    Called and spoke to both [facility] and [facility] [place].    Faxed orders for PICC line care to:  [facility]  Phone: [phone]  Fax: [phone]    Faxed orders for PICC dressing (CHG tegaderm bandage) to:   [facility]  Phone: [phone]  Fax: [phone]    Updated order for daily catheter care faxed to [facility] [place].

## 2021-10-24 ENCOUNTER — Ambulatory Visit
Admit: 2021-10-24 | Discharge: 2021-10-25 | Payer: PRIVATE HEALTH INSURANCE | Attending: Registered" | Primary: Registered"

## 2021-10-24 ENCOUNTER — Other Ambulatory Visit: Admit: 2021-10-24 | Discharge: 2021-10-25 | Payer: PRIVATE HEALTH INSURANCE

## 2021-10-24 ENCOUNTER — Ambulatory Visit: Admit: 2021-10-24 | Discharge: 2021-10-25 | Payer: PRIVATE HEALTH INSURANCE

## 2021-10-24 DIAGNOSIS — C187 Malignant neoplasm of sigmoid colon: Principal | ICD-10-CM

## 2021-10-24 DIAGNOSIS — R739 Hyperglycemia, unspecified: Principal | ICD-10-CM

## 2021-10-24 DIAGNOSIS — C189 Malignant neoplasm of colon, unspecified: Principal | ICD-10-CM

## 2021-10-24 DIAGNOSIS — R918 Other nonspecific abnormal finding of lung field: Principal | ICD-10-CM

## 2021-10-24 DIAGNOSIS — Z79899 Other long term (current) drug therapy: Principal | ICD-10-CM

## 2021-10-24 LAB — COMPREHENSIVE METABOLIC PANEL
ALBUMIN: 3.6 g/dL (ref 3.4–5.0)
ALKALINE PHOSPHATASE: 83 U/L (ref 46–116)
ALT (SGPT): 32 U/L (ref 10–49)
ANION GAP: 6 mmol/L (ref 5–14)
AST (SGOT): 33 U/L (ref <=34)
BILIRUBIN TOTAL: 0.3 mg/dL (ref 0.3–1.2)
BLOOD UREA NITROGEN: 10 mg/dL (ref 9–23)
BUN / CREAT RATIO: 21
CALCIUM: 9.8 mg/dL (ref 8.7–10.4)
CHLORIDE: 100 mmol/L (ref 98–107)
CO2: 32 mmol/L — ABNORMAL HIGH (ref 20.0–31.0)
CREATININE: 0.48 mg/dL — ABNORMAL LOW
EGFR CKD-EPI (2021) FEMALE: >90 mL/min/{1.73_m2} (ref >=60)
GLUCOSE RANDOM: 322 mg/dL — ABNORMAL HIGH (ref 70–179)
POTASSIUM: 4.2 mmol/L (ref 3.5–5.1)
PROTEIN TOTAL: 7.5 g/dL (ref 5.7–8.2)
SODIUM: 138 mmol/L (ref 135–145)

## 2021-10-24 LAB — CBC W/ AUTO DIFF
BASOPHILS ABSOLUTE COUNT: 0.1 10*9/L (ref 0.0–0.1)
BASOPHILS RELATIVE PERCENT: 1.8 %
EOSINOPHILS ABSOLUTE COUNT: 0.6 10*9/L — ABNORMAL HIGH (ref 0.0–0.5)
EOSINOPHILS RELATIVE PERCENT: 8.9 %
HEMATOCRIT: 36.2 % (ref 34.0–44.0)
HEMOGLOBIN: 11.8 g/dL (ref 11.3–14.9)
LYMPHOCYTES ABSOLUTE COUNT: 2 10*9/L (ref 1.1–3.6)
LYMPHOCYTES RELATIVE PERCENT: 29.2 %
MEAN CORPUSCULAR HEMOGLOBIN CONC: 32.5 g/dL (ref 32.0–36.0)
MEAN CORPUSCULAR HEMOGLOBIN: 26 pg (ref 25.9–32.4)
MEAN CORPUSCULAR VOLUME: 80.1 fL (ref 77.6–95.7)
MEAN PLATELET VOLUME: 7.2 fL (ref 6.8–10.7)
MONOCYTES ABSOLUTE COUNT: 0.6 10*9/L (ref 0.3–0.8)
MONOCYTES RELATIVE PERCENT: 8.6 %
NEUTROPHILS ABSOLUTE COUNT: 3.5 10*9/L (ref 1.8–7.8)
NEUTROPHILS RELATIVE PERCENT: 51.5 %
PLATELET COUNT: 479 10*9/L — ABNORMAL HIGH (ref 150–450)
RED BLOOD CELL COUNT: 4.52 10*12/L (ref 3.95–5.13)
RED CELL DISTRIBUTION WIDTH: 17.1 % — ABNORMAL HIGH (ref 12.2–15.2)
WBC ADJUSTED: 6.9 10*9/L (ref 3.6–11.2)

## 2021-10-24 LAB — CEA: CARCINOEMBRYONIC ANTIGEN: 0.6 ng/mL (ref 0.0–5.0)

## 2021-10-24 LAB — MAGNESIUM: MAGNESIUM: 1.9 mg/dL (ref 1.6–2.6)

## 2021-10-24 MED ADMIN — ondansetron (ZOFRAN) tablet 24 mg: 24 mg | ORAL | @ 17:00:00 | Stop: 2021-10-24

## 2021-10-24 MED ADMIN — dexAMETHasone (DECADRON) tablet 8 mg: 8 mg | ORAL | @ 17:00:00 | Stop: 2021-10-24

## 2021-10-24 MED ADMIN — oxaliplatin (ELOXATIN) 314.6 mg in dextrose 5 % 250 mL IVPB: 130 mg/m2 | INTRAVENOUS | @ 18:00:00 | Stop: 2021-10-24

## 2021-10-24 MED ADMIN — dextrose 5 % infusion: 100 mL/h | INTRAVENOUS | @ 18:00:00

## 2021-10-24 MED ADMIN — insulin NPH (HumuLIN,NovoLIN) injection 8 Units: 8 [IU] | SUBCUTANEOUS | @ 18:00:00 | Stop: 2021-10-24

## 2021-10-24 NOTE — Unmapped (Unsigned)
Labs collected from [facility] & sent for analysis.  Lumen flushed & saline-locked. To next appt.  Care provided by [facility].

## 2021-10-24 NOTE — Unmapped (Signed)
Order for PICC line removal placed per Dr. Rozell Searing. Patient is aware. PICC line to be pulled today while patient is in infusion.

## 2021-10-24 NOTE — Unmapped (Signed)
Contacted patient's pharmacy by phone regarding inability to fill Zofran 8 mg ODT script that was sent on 10/09/21. [facility] stated that the patient's plan has a quantity limit of #18 for this medication and a PA would be required if wishing to exceed this limit. I contacted the MAP team for further assistance regarding this request. Patient endorsed that she had ~40-50 Zofran 8 mg tablets at home from prior fills and would be covered for a few weeks. In the meantime, MAP team will work on PA to ensure no issues with filling medication when patient needs a refill.     Care coordination: 5 minutes    Ivette Loyal, PharmD   PGY-2 Oncology Pharmacy Resident    I discussed the patient with the resident and agree with the progress note as documented.    Konrad Penta, PharmD, BCOP, CPP  Clinical Pharmacist Practitioner, Gastrointestinal Oncology

## 2021-10-24 NOTE — Unmapped (Signed)
Reviewed capecitabine dosing as part of CapeOx regimen with [name]. Provided updated calendar for cycle 1 beginning on 10/24/21 (hard copy and electronic copy sent via MyChart). Patient again confirmed understanding of capecitabine dosing regimen which requires use of 2 different tablet strengths:    ??? Capecitabine 500 mg tablets: Take 4 tablets (2000 mg) PO Q12H for 14 days followed by 7 days off beginning on day 1 of each CapeOx cycle.  ??? Capecitabine 150 mg tablets: Take 2 tablets (300 mg) PO Q12H for 14 days followed by 7 days off beginning on day 1 of each CapeOx cycle.  ??? Take with water within 30 minutes after a meal for morning and evening doses. Patient aware to start with evening dose of capecitabine on day 1 of the cycle and end with morning dose on day 15 of the 21-day cycle.    Patient reports she was unable to fill ondansetron 8 mg ODT tablets and was notified about a prior authorization. Plan to follow up and call patient's local pharmacy to investigate medication access barrier.    Care coordination: 5 minutes

## 2021-10-25 NOTE — Unmapped (Signed)
Patient arrived to chair 43.  No complaints noted.  Access of PICC intact with blood return; CHG CDI. Pre-medicated per treatment plan orders. Insulin administered per orders.  Pt tolerated infusion without difficulty. PICC checked for blood return and flushed. PICC line removed by Heide Guile. RN per orders; pt tolerated without complication. Dressing was clean upon departure. AVS declined. Patient left infusion center, NAD.

## 2021-10-25 NOTE — Unmapped (Signed)
Lab on 10/24/2021   Component Date Value Ref Range Status    Sodium 10/24/2021 138  135 - 145 mmol/L Final    Potassium 10/24/2021 4.2  3.5 - 5.1 mmol/L Final    Chloride 10/24/2021 100  98 - 107 mmol/L Final    CO2 10/24/2021 32.0 (H)  20.0 - 31.0 mmol/L Final    Anion Gap 10/24/2021 6  5 - 14 mmol/L Final    BUN 10/24/2021 10  9 - 23 mg/dL Final    Creatinine 29/56/2130 0.48 (L)  0.60 - 0.80 mg/dL Final    BUN/Creatinine Ratio 10/24/2021 21   Final    eGFR CKD-EPI (2021) Female 10/24/2021 >90  >=60 mL/min/1.64m2 Final    eGFR calculated with CKD-EPI 2021 equation in accordance with [employer] and [employer] of Nephrology Task Force recommendations.    Glucose 10/24/2021 322 (H)  70 - 179 mg/dL Final    Calcium 86/57/8469 9.8  8.7 - 10.4 mg/dL Final    Albumin 62/95/2841 3.6  3.4 - 5.0 g/dL Final    Total Protein 10/24/2021 7.5  5.7 - 8.2 g/dL Final    Total Bilirubin 10/24/2021 0.3  0.3 - 1.2 mg/dL Final    AST 32/44/0102 33  <=34 U/L Final    ALT 10/24/2021 32  10 - 49 U/L Final    Alkaline Phosphatase 10/24/2021 83  46 - 116 U/L Final    Magnesium 10/24/2021 1.9  1.6 - 2.6 mg/dL Final    CEA 72/53/6644 0.6  0.0 - 5.0 ng/mL Final    WBC 10/24/2021 6.9  3.6 - 11.2 10*9/L Final    RBC 10/24/2021 4.52  3.95 - 5.13 10*12/L Final    HGB 10/24/2021 11.8  11.3 - 14.9 g/dL Final    HCT 03/47/4259 36.2  34.0 - 44.0 % Final    MCV 10/24/2021 80.1  77.6 - 95.7 fL Final    MCH 10/24/2021 26.0  25.9 - 32.4 pg Final    MCHC 10/24/2021 32.5  32.0 - 36.0 g/dL Final    RDW 56/38/7564 17.1 (H)  12.2 - 15.2 % Final    MPV 10/24/2021 7.2  6.8 - 10.7 fL Final    Platelet 10/24/2021 479 (H)  150 - 450 10*9/L Final    Neutrophils % 10/24/2021 51.5  % Final    Lymphocytes % 10/24/2021 29.2  % Final    Monocytes % 10/24/2021 8.6  % Final    Eosinophils % 10/24/2021 8.9  % Final    Basophils % 10/24/2021 1.8  % Final    Absolute Neutrophils 10/24/2021 3.5  1.8 - 7.8 10*9/L Final    Absolute Lymphocytes 10/24/2021 2.0  1.1 - 3.6 10*9/L Final    Absolute Monocytes 10/24/2021 0.6  0.3 - 0.8 10*9/L Final    Absolute Eosinophils 10/24/2021 0.6 (H)  0.0 - 0.5 10*9/L Final    Absolute Basophils 10/24/2021 0.1  0.0 - 0.1 10*9/L Final    Anisocytosis 10/24/2021 Slight (A)  Not Present Final    Hypochromasia 10/24/2021 Slight (A)  Not Present Final

## 2021-10-26 LAB — HEPATITIS B SURFACE ANTIBODY
HEPATITIS B SURFACE ANTIBODY QUANT: <8 m[IU]/mL (ref <8.00)
HEPATITIS B SURFACE ANTIBODY: NONREACTIVE

## 2021-10-26 LAB — HEPATITIS B SURFACE ANTIGEN: HEPATITIS B SURFACE ANTIGEN: NONREACTIVE

## 2021-10-26 LAB — HEPATITIS B CORE ANTIBODY, TOTAL: HEPATITIS B CORE TOTAL ANTIBODY: NONREACTIVE

## 2021-10-27 NOTE — Unmapped (Signed)
[facility] Navigation: Active Treatment Assessment    Summary: Patient is a 50 yo female that lives with her husband in [place], [place] and is currently being treated for adenocarcinoma of the sigmoid colon at [facility]. Today, she acknowledged frustration with ongoing n/v however feels well supported by the team and has been in contact with her nurse navigator this afternoon to discuss symptom management. She denies any significant barriers to care at this time, but expressed desire to take advantage of supportive resources as able. She reports realistic feelings of frustration related to tx process and current symptoms, however feels that she is coping effectively at this time and has a positive network of support. OPN will continue to follow to provide ongoing support.     Practical/Logistical:   MyChart user: Yes   Transportation issues: No - Patient reports that her husband assist with transportation.   Lodging needs: No  Form literacy: Does not require assistance  Education/Referrals/Interventions: None at this time     Psychosocial:   Distress Score: No score given, however patient acknowledges having good times and bad times but feels that she is coping well overall. Most of her stress is related to physical symptoms.   Healthy coping mechanisms: Yes - Patient sees her own personal therapist and also uses deep breathing techniques, enjoys watching funny shows and/or Christmas movies and relies on her social support networks to aid with coping.   Positive support system: Yes  Caregiver concerns: No  Education/Referrals/Interventions:  Coping with Cancer, Social Work-[place], provided active listening/emotional support and reviewed healthy coping strategies     Financial:     Patient's perceived financial toxicity: Not present  Food insecurity: Not present  Difficulty paying for medication: No  Education/Referrals/Interventions: Not needed at this time    Medical/Home/Physical Functioning:    Understanding of dx/treatment plan: Yes  Unreported tx side effects/symptoms: No - Patient reports n/v however has been in contact with her nurse navigator today about symptoms.   Issues getting medications: No  Nutrition or appetite concerns: Yes - Patient endorses appetite changes and n/v and is already connected to and meeting with an oncology dietitian.   Mobility concerns: No - However, pt reports increased fatigue as well as peripheral neuropathy since starting treatment. OPN provided education about the Cancer Rehab program and pt verbalized motivation to meet with the rehab provider to talk about symptom management and mitigate other potential issues.   Education/Referrals/Interventions: Cancer Rehab, Triage line/after hours, reinforced need for proper hydration and educated on importance of self-advocacy     Advance Care Planning:   Advance Directives on file: No  Education/Referrals/Interventions: Advance Directive Packet to be sent via MyChart     Supportive Communication:  Preferred method: MyChart and Phone  Availability: No preference  Connected to: [facility] Listserv    Additional External Resources: [facility]    Navigation Follow-up Plan: (2-5) Will provide at least 2 follow-up visits.    Patient verbalized understanding of information provided and is in agreement with discussed Navigation Plan. OPN provided Navigation Program's contact information for patient to utilize as needed.

## 2021-10-27 NOTE — Unmapped (Signed)
Called and spoke to patient to follow up on MyChart message about nausea and C1D1 Check in. Patient started CAPEOX on Friday, 12/2. Patient having mild cold sensitivity - numbness/tingling in fingers and toes, weird feeling in nose but able to drink cold liquids.     She reports nausea, one episode of emesis on Saturday night. She took zofran on Saturday and Sunday and has continued taking today. Patient has also used Klonopin at night. Patient continues to have nausea. She reports that she does not tolerate nausea well. Has taken phenergen in the past and states that it does have a significant sedating effect on her.     Patient has had increase in frequency of bowel movements. They are not completely liquid but loose, about 8 episodes per day. Has noticed increased air/gas in belly. Denies blood or discoloration but does report rectal tenderness from the frequency. She has taken some imodium. We discussed that she can take an imodium every 2 hours but to be very mindful about stopping as bowel movements taper off.     Advised patient that I would reach out to team about anti-emetics and follow up with her. She verbalized understanding and expressed appreciation for the call.

## 2021-10-28 MED ORDER — OLANZAPINE 5 MG TABLET
ORAL_TABLET | Freq: Every evening | ORAL | 1 refills | 30.00000 days | Status: CP | PRN
Start: 2021-10-28 — End: 2022-10-28

## 2021-10-29 NOTE — Unmapped (Signed)
[facility]   Medical Nutrition Therapy Consultation       Visit Type:    Initial Assessment    Referral Reason:   Nutrition during cancer treatment      Nina Mitchell is a 50 y.o. female seen for medical nutrition therapy for colon cancer. Pt with stage IIIB (pT3N1b) L-sided pMMR colon adenocarcinoma s/p open sigmoid colectomy with primary anastomosis; starting CAPOX adjuvant therapy X 3 months. Her active problem list, medication list, allergies, family history, social history, notes from last several encounters and lab results were reviewed.     Past Medical History:   Diagnosis Date   ??? Adenocarcinoma of sigmoid colon (CMS-HCC) 09/18/2021   ??? Anxiety    ??? Arthritis Jaw and other areas    2009   ??? Depression    ??? Disease of thyroid gland    ??? GERD (gastroesophageal reflux disease)    ??? HTN (hypertension)    ??? Mild intermittent asthma without complication    ??? Obesity      Anthropometrics   Height: 168 cm  Current Weight: 125.5 kg  BMI: 44.55 kg/m2  Usual Body Weight: 280 lb (140 kg)  % Usual Body Weight: 90%  Ideal Body Weight: 59.1 kg (Hamwi Method)  % Ideal Body Weight: 212%   Recent wt change: 0.8 kg wt loss in 1 month  % wt change: <1% wt loss in 1 month    Weight history:  Wt Readings from Last 6 Encounters:   10/24/21 (!) 125.2 kg (276 lb)   10/24/21 (!) 125.5 kg (276 lb 11.2 oz)   09/19/21 (!) 126 kg (277 lb 12.8 oz)   06/14/17 (!) 127.9 kg (282 lb)   05/05/17 (!) 132.5 kg (292 lb)   04/07/17 (!) 134 kg (295 lb 6.4 oz)       Nutrition Risk Screening:   Food Insecurity: No Food Insecurity   ??? Worried About Programme researcher, broadcasting/film/video in the Last Year: Never true   ??? Ran Out of Food in the Last Year: Never true        Nutrition-Focused Physical Findings   Overall Appearance: Nourished  Skin:  Intact    Physical Activity:  Physical activity level is light with some exercise.     ASPEN/AND Malnutrition Screening:   Screening not indicated as pt does not display any malnutrition risk factors.    Biochemical Data, Medical Tests and Procedures:  All pertinent labs and imaging reviewed by Howie Ill at 2:00 PM 10/24/2021.    Glucose 322 mg/dL    Medications and Vitamin/Mineral Supplementation:   All nutritionally pertinent medications reviewed on 10/24/2021.   Nutritionally pertinent medications include: Ozempic- starting at lower dose with MD plan to adjust in future, recently completed antibiotic course for staph infection  She is taking nutrition supplements: Flintstone vitamin X 2 daily    Current Outpatient Medications   Medication Sig Dispense Refill   ??? acetaminophen (TYLENOL) 500 MG tablet Take 500 mg by mouth every twelve (12) hours as needed for pain.     ??? atenolol (TENORMIN) 25 MG tablet Take 1 mg by mouth nightly.     ??? capecitabine (XELODA) 150 MG tablet Take 2 tablets (300 mg total) by mouth Two (2) times a day  with 1 other capecitabine prescription for 2,300 mg total. Take Days 1 through 14, then 1 week off. 56 tablet 3   ??? capecitabine (XELODA) 500 MG tablet Take 4 tablets (2,000 mg total)  by mouth Two (2) times a day  with 1 other capecitabine prescription for 2,300 mg total. Take Days 1 through 14, then 1 week off. 112 tablet 3   ??? clonazePAM (KLONOPIN) 0.5 MG tablet 0.5 mg daily as needed.     ??? docusate sodium (COLACE) 100 MG capsule Take 100 mg by mouth nightly as needed for constipation.     ??? ESCITALOPRAM OXALATE ORAL 20 mg daily.     ??? famotidine (PEPCID) 40 MG tablet Take 1 tablet (40 mg total) by mouth Two (2) times a day. 60 tablet 0   ??? fluconazole (DIFLUCAN) 150 MG tablet fluconazole 150 mg tablet   TAKE 1 TABLET BY MOUTH EVERY 72 HOURS     ??? gabapentin (NEURONTIN) 300 MG capsule Take 300 mg by mouth nightly.     ??? levothyroxine (SYNTHROID) 25 MCG tablet Take 25 mcg by mouth daily.     ??? lisinopriL (PRINIVIL,ZESTRIL) 2.5 MG tablet 2.5 mg  in the morning.     ??? loperamide (IMODIUM) 2 mg capsule If having diarrhea, take 2 capsules (4 mg) after the first loose stool, then 1 capsule every 2 hours until diarrhea free for 12 hours. 60 capsule 11   ??? multivitamin with minerals tablet Take 1 tablet by mouth daily.      ??? OLANZapine (ZYPREXA) 5 MG tablet Take 1 tablet (5 mg total) by mouth nightly as needed. 30 tablet 1   ??? onabotulinumtoxinA (BOTOX) 200 unit SolR injection Inject 200 Units into the skin Every three (3) months.     ??? ondansetron (ZOFRAN-ODT) 8 MG disintegrating tablet Take 1 tablet by mouth twice daily on days 2 and 3 of chemotherapy cycles. May take up to 1 tablet every 8 hours as needed (but do not take on day 1 of chemotherapy cycles). 30 tablet 2   ??? oxyCODONE-acetaminophen (PERCOCET) 10-325 mg per tablet Take 1 tablet by mouth every six (6) hours as needed.   0   ??? rizatriptan (MAXALT-MLT) 10 MG disintegrating tablet Take 10 mg by mouth daily as needed.      ??? rosuvastatin (CRESTOR) 5 MG tablet Take 5 mg by mouth every evening.     ??? semaglutide (OZEMPIC) 0.25 mg or 0.5 mg(2 mg/1.5 mL) PnIj injection Ozempic 0.25 mg or 0.5 mg (2 mg/1.5 mL) subcutaneous pen injector   INJECT 0.25MG  WEEKLY EVERY 4 WEEKS AND THEN INCREASE TO 0.5MG  WEEKLY     ??? tiZANidine (ZANAFLEX) 4 MG tablet Take 4 mg by mouth every eight (8) hours as needed.        No current facility-administered medications for this visit.       Nutrition History:     Dietary Restrictions: Limits intake of beef for general health as well as milk due to childhood allergy. Also restricts artificial sweeteners due to history of migraines.    Gastrointestinal Issues: Diarrhea- attributes to antibiotics    Oral Issues: None reported    Hunger and Satiety: Slightly decreased appetite- pt attributes to recent initiation of Ozempic.    Food Safety and Access: She did not report issues.       Diet Recall:   Breakfast: couple of eggs with thin slice of [employer] K[employer] bread with seeds  Lunch: leftovers such as meatloaf with green beans or grilled chicken with veggies  Dinner: chicken or [place] tenderloin with mashed potatoes  Snack(s): popcorn and pickles  Beverages: 80 oz water- adds 1 packet DripDrop ORS per 40 oz, sips on Coke, cold  brew tea with light sugar  Alcohol: none reported  Supplements: none reported    Pt endorses eating majority of meals; trying to cut back on snacking to help prevent wt gain.  Nutrition Assessment     Food- and nutrition-related knowledge deficit related to nutrition during cancer treatment as evidenced by pt's questions/comments. May need to limit high fiber foods such as whole grains and raw fruit/veggies depending on treatment side effects. May continue daily multivitamin as intake may be limited during treatment.    Altered blood glucose related to DM2 as evidenced by serum glucose. May require further adjustment depending on po intake during treatment.    May re-establish healthy gut flora post antibiotics with addition of probiotic-containing foods such as yogurt.    Daily Estimated Nutritional Needs:  Energy: 15-18 kcal/kg (actual wt)  Protein: 0.8-1 g/kg (actual wt)  Carbohydrate: 50% total kcal  Fluid: 1 ml/kcal  Nutrition Goals & Evaluation    - Maintain wt w/in +/- 1% each week.  (New)  - Meet > 85% estimated nutrition needs daily.  (New)  - Adequate hydration.  (New)      Nutrition goals reviewed, and relevant barriers identified and addressed: none evident. She is evaluated to have excellent willingness and ability to achieve nutrition goals.   Nutrition Intervention      - Nutrition Education: nutrition during cancer treatment, cancer survivorship healthy eating guidelines diet/eating style. Education resources provided include: handouts promoting nutrition intervention , lists of foods recommended and foods not recommended, online resources  and contact information   - Vitamin/Mineral Supplementation: multivitamin    Nutrition Plan:   ??? Provided diet instruction regarding appropriate eating plan during treatment. Discussed potential for side effects such as poor appetite and diarrhea as well as appropriate dietary modifications such as limiting high fiber foods and eating frequent small meals/snacks including high calorie/high protein foods.  ??? Recommend incorporating probiotic-containing foods such as yogurt, kefir. If diarrhea persists, may consider probiotic supplementation.  ??? Reviewed AICR's Recommendations for Cancer Prevention. Based on nutrition-related impact symptoms, may need to wait until completion of treatment to focus on recommendations.  ??? Continue daily multivitamin.      Materials Provided: High Calorie/High Protein Foods and Snacks, [employer] Eating Hints Booklet, Recommended Oncology Nutrition Websites      Follow up will occur in 2-3 weeks, or as needed.       Food/Nutrition-related history, Anthropometric measurements, Biochemical data, medical tests, procedures, Nutrition-focused physical findings, Patient understanding or compliance with intervention and recommendations  and Effectiveness of nutrition interventions will be assessed at time of follow-up.     Recommendations for Clinical Team, Care Team  and Referring Provider :  - Continue daily multivitamin.       Time spent 37 minutes

## 2021-10-30 ENCOUNTER — Ambulatory Visit: Admit: 2021-10-30 | Discharge: 2021-10-31 | Payer: PRIVATE HEALTH INSURANCE

## 2021-10-30 LAB — COMPREHENSIVE METABOLIC PANEL
ALBUMIN: 3.9 g/dL (ref 3.4–5.0)
ALKALINE PHOSPHATASE: 85 U/L (ref 46–116)
ALT (SGPT): 36 U/L (ref 10–49)
ANION GAP: 7 mmol/L (ref 5–14)
AST (SGOT): 29 U/L (ref <=34)
BILIRUBIN TOTAL: 0.3 mg/dL (ref 0.3–1.2)
BLOOD UREA NITROGEN: 9 mg/dL (ref 9–23)
BUN / CREAT RATIO: 16
CALCIUM: 9.6 mg/dL (ref 8.7–10.4)
CHLORIDE: 104 mmol/L (ref 98–107)
CO2: 27.3 mmol/L (ref 20.0–31.0)
CREATININE: 0.55 mg/dL — ABNORMAL LOW
EGFR CKD-EPI (2021) FEMALE: >90 mL/min/{1.73_m2} (ref >=60)
GLUCOSE RANDOM: 272 mg/dL — ABNORMAL HIGH (ref 70–179)
POTASSIUM: 3.8 mmol/L (ref 3.4–4.8)
PROTEIN TOTAL: 7.8 g/dL (ref 5.7–8.2)
SODIUM: 138 mmol/L (ref 135–145)

## 2021-10-30 LAB — CBC W/ AUTO DIFF
BASOPHILS ABSOLUTE COUNT: 0.1 10*9/L (ref 0.0–0.1)
BASOPHILS RELATIVE PERCENT: 1.3 %
EOSINOPHILS ABSOLUTE COUNT: 0.5 10*9/L (ref 0.0–0.5)
EOSINOPHILS RELATIVE PERCENT: 6.7 %
HEMATOCRIT: 37.1 % (ref 34.0–44.0)
HEMOGLOBIN: 12 g/dL (ref 11.3–14.9)
LYMPHOCYTES ABSOLUTE COUNT: 2.4 10*9/L (ref 1.1–3.6)
LYMPHOCYTES RELATIVE PERCENT: 30.9 %
MEAN CORPUSCULAR HEMOGLOBIN CONC: 32.3 g/dL (ref 32.0–36.0)
MEAN CORPUSCULAR HEMOGLOBIN: 25.8 pg — ABNORMAL LOW (ref 25.9–32.4)
MEAN CORPUSCULAR VOLUME: 79.9 fL (ref 77.6–95.7)
MEAN PLATELET VOLUME: 7.8 fL (ref 6.8–10.7)
MONOCYTES ABSOLUTE COUNT: 0.7 10*9/L (ref 0.3–0.8)
MONOCYTES RELATIVE PERCENT: 9.1 %
NEUTROPHILS ABSOLUTE COUNT: 4.1 10*9/L (ref 1.8–7.8)
NEUTROPHILS RELATIVE PERCENT: 52 %
NUCLEATED RED BLOOD CELLS: 0 /100{WBCs} (ref <=4)
PLATELET COUNT: 399 10*9/L (ref 150–450)
RED BLOOD CELL COUNT: 4.64 10*12/L (ref 3.95–5.13)
RED CELL DISTRIBUTION WIDTH: 17.4 % — ABNORMAL HIGH (ref 12.2–15.2)
WBC ADJUSTED: 7.9 10*9/L (ref 3.6–11.2)

## 2021-10-30 LAB — MAGNESIUM: MAGNESIUM: 1.7 mg/dL (ref 1.6–2.6)

## 2021-10-30 LAB — D-DIMER, QUANTITATIVE: D-DIMER QUANTITATIVE (CW,ML,HL,HS,CH,JS,JC,RX): 1128 ng{FEU}/mL — ABNORMAL HIGH (ref <=500)

## 2021-10-30 LAB — HIGH SENSITIVITY TROPONIN I - SINGLE
HIGH SENSITIVITY TROPONIN I: 43 ng/L (ref <=34)
HIGH SENSITIVITY TROPONIN I: 30 ng/L (ref <=34)

## 2021-10-30 MED ADMIN — ketorolac (TORADOL) injection 15 mg: 15 mg | INTRAVENOUS | @ 21:00:00 | Stop: 2021-10-30

## 2021-10-30 MED ADMIN — aspirin chewable tablet 324 mg: 324 mg | ORAL | @ 21:00:00 | Stop: 2021-10-30

## 2021-10-30 MED ADMIN — nitroglycerin (NITROSTAT) SL tablet 0.4 mg: .4 mg | SUBLINGUAL | @ 21:00:00 | Stop: 2021-10-30

## 2021-10-30 MED ADMIN — iohexoL (OMNIPAQUE) 350 mg iodine/mL solution 75 mL: 75 mL | INTRAVENOUS | @ 21:00:00 | Stop: 2021-10-30

## 2021-10-30 NOTE — Unmapped (Signed)
Pt sts she began an oral medication for colon cancer on Friday. sts since taking the medication began having chest tightness and L arm pain.

## 2021-10-30 NOTE — Unmapped (Addendum)
Called and spoke to patient to follow up on MyChart messages received. Patient started CapeOx on Friday, 12/2. Started Zyprexa on Tuesday evening. Patient reports that yesterday evening she felt pressure in area of sternum, under throat. She took Klonipin and after 30 min the discomfort calmed down.     This morning, patient woke up to work from home. She felt same pressure in her chest. She also reports pain in left wrist area, just around the wrist. Again she took Klonipin and 30 min she felt relief. At time of call, patient was feeling okay, no symptoms or discomfort.     Patient reports that she had some mild arm pain for a few days after oxaliplatin infusion. Today, pulse is calm, denies shortness of breath, no visual disturbances, no jaw pain. Has slight lightheadedness.     Patient has family history of heart disease. She also has history of anxiety and reports anxiety around her diagnosis. We discussed that it is worth having her be evaluated. Advised patient that I would reach out about the possibility of an emergent visit in Infusion as well as reach out to Dr. Rozell Searing and NP with update.    Infusion room reports that they cannot do emergent visits for chest pain. Discussed with Ophelia Charter, NP. Given her family history and the fact that she just started capecitabine, recommendation is for patient to be seen and evaluated as it is difficult to differentiate anxiety versus cardiac issues. Called and spoke to patient. Advised to present to ED for evaluation (EKG and lab work). She is in agreement with this plan and expressed appreciation for the call.

## 2021-10-30 NOTE — Unmapped (Signed)
[facility] Emergency Department Provider Note    Final Diagnosis  Final diagnoses:   Chest pain, unspecified type (Primary)   Elevated troponin     ED Course, Assessment and Plan     Initial Clinical Impression:    October 30, 2021 1:02 PM   Nina Mitchell is a 50 y.o. female with a PMH of adenocarcinoma of the sigmoid colon (on CapeOx chemotherapy), HTN, GERD, arthritis, and depression who presents to the ED for 1 day of intermittent chest tightness in the center of her chest, chest pain, and left wrist pain, as described below. On exam, Vital signs remarkable for hypertension to 188/100. Patient is afebrile and satting 100% on RA. Overall nontoxic-appearing.  Normal cardiopulmonary exam.  Normal abdominal exam.     BP 125/73  - Pulse 96  - Temp 36.4 ??C (97.5 ??F) (Oral)  - Resp 20  - SpO2 98%     From an ACS standpoint, the patient has a HEART score of 4 (intermediate risk) and has a reassuring EKG. Will further evaluate for this possibility with serial troponin levels to include a measurement drawn at least 3 hours from the onset of pain.     Considering a diagnosis of pulmonary embolism.  Overall clinical impression low risk, patient with a Well's score of 4 or less, pretest probability approximately 3%.  Cannot be clinically excluded by [facility] due to age.   Will obtain d-dimer.  If positive, proceed with CTA PE. If negative, clinically excluded.      Will evaluate for pneumonia with a chest x-ray.  I have a low suspicion for aortic dissection given that pain is not ripping or radiating to the back, pt has equal pulses, stable vitals, no marfanoid features or evidence of connective tissue dz, and no neuro deficits; if chest x-ray is without evidence of mediastinal widening, will not pursue diagnosis further. Low suspicion for PTX given bilateral breath sounds. No risk factors such as recent endoscopy or frequent heavy retching, nor sxs of dysphagia/hematemesis to suggest esophageal rupture.    ED Course:    ED Course as of 10/30/21 1618   Thu Oct 30, 2021   1415 hsTroponin I(!!): 43   1416 D-Dimer(!): 1,128  Will order CTAPE study.   [id number] Radiology messaged me and notified me that she has a small filling defect in the distal right lower lung lobe that he believes is artifact.  I have paged for admission at this time.  Her second troponin did go down, however given her new chemo drug with vasospasm as a side effect and initial evaluated troponin, believe she does warrant admission for observation, possible cath, and possible oncology consult for medication adjustment.        _____________________________________________________________________    The case was discussed with attending physician who is in agreement with the above assessment and plan    Dictation software was used while making this note. Please excuse any errors made with dictation software.    Additional Medical Decision Making     I have reviewed the vital signs and the nursing notes. Labs and radiology results that were available during my care of the patient were independently reviewed by me and considered in my medical decision making.     I independently visualized the EKG tracing if performed  I independently visualized the radiology images if performed  I reviewed the patient's prior medical records if available.  Additional history obtained from family if available    History  CHIEF COMPLAINT:   Chief Complaint   Patient presents with   ??? Chest Tightness     HPI: Nina Mitchell is a 50 y.o. 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 The patient reports 1 day of intermittent chest tightness in the center of her chest, chest pain, and left wrist pain. Of note, she began her CapeOx chemotherapy 5 days ago. She endorses a history of similar symptoms many years ago and noted that her D-Dimer was elevated, but she did not have a blood clot at that time. The patient endorses a family history of heart disease. She denies tobacco use. The patient denies leg swelling, leg pain, abdominal pain, or back pain.    Per chart review, she had a colon resection on September 05, 2021.    PAST MEDICAL HISTORY/PAST SURGICAL HISTORY:   Past Medical History:   Diagnosis Date   ??? Adenocarcinoma of sigmoid colon (CMS-HCC) 09/18/2021   ??? Anxiety    ??? Arthritis Jaw and other areas    2009   ??? Depression    ??? Disease of thyroid gland    ??? GERD (gastroesophageal reflux disease)    ??? HTN (hypertension)    ??? Mild intermittent asthma without complication    ??? Obesity      Past Surgical History:   Procedure Laterality Date   ??? IR INSERT PORT AGE GREATER THAN 5 YRS  09/29/2021    IR INSERT PORT AGE GREATER THAN 5 YRS 09/29/2021 Trude Mcburney, MD IMG [facility]IR [facility]   ??? PR HYSTEROSCOPY,W/ENDOMETRIAL ABLATION N/A 04/17/2015    Procedure: HYSTEROSCOPY SURGICAL; W/ENDOMETRIAL ABLATION (EG, ENDOMETRIAL RESECT, ELECTROSURGICAL ABLATION, THERMOABL);  Surgeon: Orma Render, MD;  Location: OR [facility];  Service: Gynecology   ??? SINUS SURGERY     ??? TEMPOROMANDIBULAR JOINT SURGERY     ??? TYMPANOSTOMY TUBE PLACEMENT       MEDICATIONS:   No current facility-administered medications for this encounter.    Current Outpatient Medications:   ???  acetaminophen (TYLENOL) 500 MG tablet, Take 500 mg by mouth every twelve (12) hours as needed for pain., Disp: , Rfl:   ???  atenolol (TENORMIN) 25 MG tablet, Take 1 mg by mouth nightly., Disp: , Rfl:   ???  capecitabine (XELODA) 150 MG tablet, Take 2 tablets (300 mg total) by mouth Two (2) times a day  with 1 other capecitabine prescription for 2,300 mg total. Take Days 1 through 14, then 1 week off., Disp: 56 tablet, Rfl: 3  ???  capecitabine (XELODA) 500 MG tablet, Take 4 tablets (2,000 mg total) by mouth Two (2) times a day  with 1 other capecitabine prescription for 2,300 mg total. Take Days 1 through 14, then 1 week off., Disp: 112 tablet, Rfl: 3  ??? clonazePAM (KLONOPIN) 0.5 MG tablet, 0.5 mg daily as needed., Disp: , Rfl:   ???  docusate sodium (COLACE) 100 MG capsule, Take 100 mg by mouth nightly as needed for constipation., Disp: , Rfl:   ???  ESCITALOPRAM OXALATE ORAL, 20 mg daily., Disp: , Rfl:   ???  famotidine (PEPCID) 40 MG tablet, Take 1 tablet (40 mg total) by mouth Two (2) times a day., Disp: 60 tablet, Rfl: 0  ???  fluconazole (DIFLUCAN) 150 MG tablet, fluconazole 150 mg tablet  TAKE 1 TABLET BY MOUTH EVERY 72 HOURS, Disp: , Rfl:   ???  gabapentin (  NEURONTIN) 300 MG capsule, Take 300 mg by mouth nightly., Disp: , Rfl:   ???  levothyroxine (SYNTHROID) 25 MCG tablet, Take 25 mcg by mouth daily., Disp: , Rfl:   ???  lisinopriL (PRINIVIL,ZESTRIL) 2.5 MG tablet, 2.5 mg  in the morning., Disp: , Rfl:   ???  loperamide (IMODIUM) 2 mg capsule, If having diarrhea, take 2 capsules (4 mg) after the first loose stool, then 1 capsule every 2 hours until diarrhea free for 12 hours., Disp: 60 capsule, Rfl: 11  ???  multivitamin with minerals tablet, Take 1 tablet by mouth daily. , Disp: , Rfl:   ???  OLANZapine (ZYPREXA) 5 MG tablet, Take 1 tablet (5 mg total) by mouth nightly as needed., Disp: 30 tablet, Rfl: 1  ???  onabotulinumtoxinA (BOTOX) 200 unit SolR injection, Inject 200 Units into the skin Every three (3) months., Disp: , Rfl:   ???  ondansetron (ZOFRAN-ODT) 8 MG disintegrating tablet, Take 1 tablet by mouth twice daily on days 2 and 3 of chemotherapy cycles. May take up to 1 tablet every 8 hours as needed (but do not take on day 1 of chemotherapy cycles)., Disp: 30 tablet, Rfl: 2  ???  oxyCODONE-acetaminophen (PERCOCET) 10-325 mg per tablet, Take 1 tablet by mouth every six (6) hours as needed. , Disp: , Rfl: 0  ???  rizatriptan (MAXALT-MLT) 10 MG disintegrating tablet, Take 10 mg by mouth daily as needed. , Disp: , Rfl:   ???  rosuvastatin (CRESTOR) 5 MG tablet, Take 5 mg by mouth every evening., Disp: , Rfl:   ???  semaglutide (OZEMPIC) 0.25 mg or 0.5 mg(2 mg/1.5 mL) PnIj injection, Ozempic 0.25 mg or 0.5 mg (2 mg/1.5 mL) subcutaneous pen injector  INJECT 0.25MG  WEEKLY EVERY 4 WEEKS AND THEN INCREASE TO 0.5MG  WEEKLY, Disp: , Rfl:   ???  tiZANidine (ZANAFLEX) 4 MG tablet, Take 4 mg by mouth every eight (8) hours as needed. , Disp: , Rfl:     ALLERGIES:   Bupropion, Prochlorperazine maleate, Sulfa (sulfonamide antibiotics), Sulfamethoxazole-trimethoprim, Wellbutrin [bupropion hcl], Benzonatate, and Cefaclor    SOCIAL HISTORY:   Social History     Tobacco Use   ??? Smoking status: Never   ??? Smokeless tobacco: Never   Substance Use Topics   ??? Alcohol use: Yes     Alcohol/week: 0.0 standard drinks     Types: 1 drink(s) per week     Comment: RARELY     FAMILY HISTORY:  Family History   Problem Relation Age of Onset   ??? Cancer Mother    ??? Heart disease Mother    ??? Thyroid disease Mother    ??? Breast cancer Mother 57   ??? Heart disease Father    ??? COPD Maternal Grandmother    ??? Heart disease Maternal Grandmother       Review of Systems    A 10 point review of systems was performed and is negative other than positive elements noted in HPI   Constitutional: Negative for fever.  Eyes: Negative for visual changes.  ENT: Negative for sore throat.  Cardiovascular: Positive for chest pain and chest tightness.  Respiratory: Negative for shortness of breath.  Gastrointestinal: Negative for abdominal pain, vomiting or diarrhea.  Genitourinary: Negative for dysuria.  Musculoskeletal: Negative for back pain.  Skin: Negative for rash.  Neurological: Negative for headaches, focal weakness or numbness.    Physical Exam     VITAL SIGNS:    BP 125/73  - Pulse 96  -  Temp 36.4 ??C (97.5 ??F) (Oral)  - Resp 20  - SpO2 98%     Constitutional: Alert and oriented. Nontoxic appearing and in no distress.  Eyes: Conjunctivae are normal.  ENT       Head: Normocephalic and atraumatic.       Nose: No congestion.       Mouth/Throat: Mucous membranes are moist.       Neck: No stridor.  Cardiovascular: Normal rate, regular rhythm. Respiratory: Normal respiratory effort. Breath sounds are normal.  Gastrointestinal: Soft and nontender.   Genitourinary: No suprapubic tenderness  Musculoskeletal: Normal range of motion in all extremities.   Extremities: No LE edema  Neurologic: Normal speech and language. No gross focal neurologic deficits are appreciated.  Skin: Skin is warm, dry. No rash noted.  Psychiatric: Mood and affect are normal. Speech and behavior are normal.    Radiology     CTA Chest W Contrast   Final Result      Examination limited by image noise. No definitive pulmonary emboli and no acute findings.      Hepatic steatosis. Splenomegaly.       The findings of this study were discussed via Epic secure chat with confirmed receipt and response with DR. Aundrea Higginbotham M Lianne Carreto by Dr. Renato Battles on 10/30/2021 4:05 PM.               XR Chest 2 views   Final Result      No evidence of acute cardiopulmonary pathology.        ECG 12 Lead    Result Date: 10/30/2021  NORMAL SINUS RHYTHM MINIMAL VOLTAGE CRITERIA FOR LVH, MAY BE NORMAL VARIANT ( R in aVL ) CANNOT RULE OUT ANTERIOR INFARCT  (CITED ON OR BEFORE 07-Oct-2021) ABNORMAL ECG WHEN COMPARED WITH ECG OF 30-Oct-2021 11:18, QUESTIONABLE CHANGE IN INITIAL FORCES OF LATERAL LEADS    ECG 12 Lead    Result Date: 10/30/2021  NORMAL SINUS RHYTHM ANTEROLATERAL INFARCT  (CITED ON OR BEFORE 07-Oct-2021) ABNORMAL ECG WHEN COMPARED WITH ECG OF 07-Oct-2021 14:02, QUESTIONABLE CHANGE IN INITIAL FORCES OF LATERAL LEADS NONSPECIFIC T WAVE ABNORMALITY, WORSE IN ANTERIOR LEADS    XR Chest 2 views    Result Date: 10/30/2021  EXAM: XR CHEST 2 VIEWS DATE: 10/30/2021 1:38 PM ACCESSION: [id number] UN DICTATED: 10/30/2021 1:44 PM INTERPRETATION LOCATION: [facility] CLINICAL INDICATION: 50 years old Female with CHEST PAIN  COMPARISON: 10/07/2021 and prior chest radiograph. Chest CT dated 09/29/2021. TECHNIQUE: PA and Lateral Chest Radiographs. FINDINGS: The lungs appear clear. No pleural effusion or pneumothorax is identified. The cardiomediastinal silhouette appears similar to the prior exam. Surgical clips project in the right upper quadrant.     No evidence of acute cardiopulmonary pathology.    CTA Chest W Contrast    Result Date: 10/30/2021  EXAM: CTA Chest for Pulmonary Embolus DATE: 10/30/2021 3:40 PM ACCESSION: [id number] UN DICTATED: 10/30/2021 3:43 PM INTERPRETATION LOCATION: [facility] CLINICAL INDICATION: 50 years old Female with chest tightness, concern for PE. Colon cancer.  COMPARISON: 1222 chest radiograph. Chest CT dated 09/29/2021. TECHNIQUE: Contiguous 1 and 2 mm axial images were reconstructed through the chest following a single breath hold helical acquisition.  Images were reformatted in the sagittal and coronal planes.  Multiplanar MIP slabs were created for more thorough evaluation of the pulmonary vasculature. Intravenous contrast was administered. The examination was performed with limited z-axis coverage and reduced kVp to improve vascular opacification and reduce breath hold and contrast and radiation dose. kVP:  100 DLP:  112.8 FINDINGS: Evaluation limited by high image noise and artifact from motion artifact. PULMONARY ARTERIES: No definitive emboli in either lung. Small apparent filling defect in one in the medial right lower lobe pulmonary arteries (5:70) is favored to represent artifact. The right heart is not dilated. LUNGS AND AIRWAYS: Hypoventilatory exam. Bibasilar linear atelectasis. Previously described bilateral pulmonary nodules were better demonstrated on 09/29/2021 chest CTA. Lungs are otherwise clear. The central airways are patent. PLEURA: No pleural fluid or pneumothorax. MEDIASTINUM AND LYMPH NODES: No enlarged intrathoracic or axillary lymph nodes are present.    No other mediastinal abnormalities are present. HEART AND VASCULATURE:  Cardiomegaly.  Ascending and descending aorta normal in caliber.    There is no pericardial effusion. BONES AND SOFT TISSUES: Previously described lytic and sclerotic foci in the vertebral bodies and ribs were better demonstrated on 09/29/2021 chest CT. Degenerative changes of the spine. No acute soft tissue abnormality within the field-of-view. UPPER ABDOMEN: Hepatic steatosis. Splenomegaly.     Examination limited by image noise. No definitive pulmonary emboli and no acute findings. Hepatic steatosis. Splenomegaly.  The findings of this study were discussed via Epic secure chat with confirmed receipt and response with DR. Marzella Schlein Alaster Asfaw by Dr. Renato Battles on 10/30/2021 4:05 PM.       Pertinent labs & imaging results that were available during my care of the patient were reviewed by me and considered in my medical decision making (see chart for details).    Please note- This chart has been created using [employer]. Chart creation errors have been sought, but may not always be located and such creation errors, especially pronoun confusion, do NOT reflect on the standard of medical care.    Documentation assistance was provided by Ovid Curd, Scribe on October 30, 2021 at 1:02 PM for Dorien Chihuahua, MD.    NOTE TO PROVIDER: PLEASE ADD .EDPROVSCRIBEATTEST NOTING YOU AGREE WITH SCRIBE DOCUMENTATION     Griffith Citron  Resident  10/30/21 [phone]

## 2021-10-30 NOTE — Unmapped (Signed)
Faxed Signed Plan of Treatment for Infusion services to:    [facility]  Phone: [phone]  Fax: [phone]    Confirmed receipt

## 2021-10-31 LAB — BASIC METABOLIC PANEL
ANION GAP: 8 mmol/L (ref 5–14)
BLOOD UREA NITROGEN: 10 mg/dL (ref 9–23)
BUN / CREAT RATIO: 20
CALCIUM: 9.3 mg/dL (ref 8.7–10.4)
CHLORIDE: 102 mmol/L (ref 98–107)
CO2: 28 mmol/L (ref 20.0–31.0)
CREATININE: 0.5 mg/dL — ABNORMAL LOW
EGFR CKD-EPI (2021) FEMALE: >90 mL/min/{1.73_m2} (ref >=60)
GLUCOSE RANDOM: 272 mg/dL — ABNORMAL HIGH (ref 70–179)
POTASSIUM: 3.6 mmol/L (ref 3.4–4.8)
SODIUM: 138 mmol/L (ref 135–145)

## 2021-10-31 LAB — PHOSPHORUS: PHOSPHORUS: 3.4 mg/dL (ref 2.4–5.1)

## 2021-10-31 LAB — MAGNESIUM: MAGNESIUM: 1.6 mg/dL (ref 1.6–2.6)

## 2021-10-31 MED ORDER — METOPROLOL SUCCINATE ER 25 MG TABLET,EXTENDED RELEASE 24 HR
ORAL_TABLET | Freq: Every day | ORAL | 0 refills | 30 days | Status: CP
Start: 2021-10-31 — End: 2021-11-30

## 2021-10-31 MED ORDER — RIZATRIPTAN 10 MG DISINTEGRATING TABLET
ORAL_TABLET | Freq: Every day | ORAL | 0 refills | 30 days | Status: CP | PRN
Start: 2021-10-31 — End: 2021-11-30

## 2021-10-31 MED ADMIN — levothyroxine (SYNTHROID) tablet 25 mcg: 25 ug | ORAL | @ 14:00:00 | Stop: 2021-10-31

## 2021-10-31 MED ADMIN — famotidine (PEPCID) tablet 40 mg: 40 mg | ORAL | @ 14:00:00 | Stop: 2021-10-31

## 2021-10-31 MED ADMIN — sulfur hexafluoride microsphreres (LUMASON) intravenous suspension 2 mL: 2 mL | INTRAVENOUS | @ 20:00:00 | Stop: 2021-10-31

## 2021-10-31 MED ADMIN — escitalopram oxalate (LEXAPRO) tablet 20 mg: 20 mg | ORAL | @ 14:00:00 | Stop: 2021-10-31

## 2021-10-31 MED ADMIN — ondansetron (ZOFRAN-ODT) disintegrating tablet 4 mg: 4 mg | ORAL | @ 20:00:00 | Stop: 2021-10-31

## 2021-10-31 MED ADMIN — lisinopriL (PRINIVIL,ZESTRIL) tablet 2.5 mg: 2.5 mg | ORAL | @ 14:00:00 | Stop: 2021-10-31

## 2021-10-31 MED ADMIN — LORazepam (ATIVAN) tablet 1 mg: 1 mg | ORAL | @ 06:00:00 | Stop: 2021-10-31

## 2021-10-31 NOTE — Unmapped (Addendum)
Nina Mitchell is a 50 y.o. female with PMHx of HTN, T2DM, HLD, GERD, hypothyroidism, depression/anxiety, migraines, and recent diagnosis of stage IIIb (Z6X0R) left-sided colon adenocarcinoma (October 2022) s/p open sigmoid colectomy (09/05/21) and recent RIJ chest port placement (09/29/21) who presented w/ chest spasm pain in setting of recent initiation of Capecitabine.      Chest Pain c/f spasm in setting of capecitabine: Several episodes of chest pain from 12/7 to 12/8 with no changes on EKG and unremarkable hsTrop of 43 and 30 on repeat most c/w capecitabine induced coronary vasospasm as this is a known cardiotoxic effect. Pt does have notable cardiac fam hx with father dying of an MI at age 19, however given this patient has had multiple stress test which she reports have all been normal and had a left heart cath in 2019 which demonstrated normal coronary arteries.  She was very well-appearing on presentation and given her unremarkable EKGs and troponins and timing with initiation of capecitabine presentation is most consistent with a drug-induced vasospasm. Cardiology was consulted to evaluate for possible coronary artery disease who recommended a PET stress test and outpatient follow up. They would be able to finish their workup before her next capecitabine dose on 11/14/2021.   - Monitored during admission for decompensation  - Will finish cardiac workup before next dose of chemo on 11/14/2021 (PET stress test)  - hold triptans in setting of chest pain episodes     Stage IIIB (pT3N1b) L-sided pMMR Colon Adenocarcinoma: First presented with generalized abdominal pain found to have a mass in sigmoid colon with near total obstruction s/p open colectomy staged as T3N1b. Treatment course to date as outlined in HPI. Pt. of Dr. Rozell Searing, currently attending on service.     HTN: Home atenolol 25mg  at bedtime, but changed to metoprolol succinate 25mg  daily per cardiology; lisinopril 2.5mg  daily  HLD: Home equivalent w/ atorva 10mg  daily   GERD: Home famotidine 40mg  BID  Anxiety: Home lexapro 20mg  daily  Neuropathy/Pain/Insomnia: Home gabapentin 300mg  nightly, tizanidine 4mg  nightly  Hypothyroidism: Home synthroid daily  OSA: Uses CPAP, didn't bring

## 2021-10-31 NOTE — Unmapped (Signed)
Oncology (MEDO) History & Physical    Assessment & Plan:   Nina Mitchell is a 50 y.o. female with PMHx of HTN, T2DM, HLD, GERD, hypothyroidism, depression/anxiety, migraines, and recent diagnosis of stage IIIb (U9W1X) left-sided colon adenocarcinoma (October 2022) s/p open sigmoid colectomy (09/05/21) and recent RIJ chest port placement (09/29/21) who presented w/ chest spasm pain in setting of recent initiation of Capecitabine.    Principal Problem:    Chest pain  Active Problems:    Gastroesophageal reflux disease without esophagitis    Situational mixed anxiety and depressive disorder    Obstructive sleep apnea syndrome    HTN (hypertension)    Anxiety    Adenocarcinoma of sigmoid colon (CMS-HCC)  Resolved Problems:    * No resolved hospital problems. *    Chest Pain c/f spasm in setting of capecitabine: Several episodes of chest pain from 12/7 to 12/8 with no changes on EKG and unremarkable hsTrop of 43 and 30 on repeat most c/w capecitabine induced coronary vasospasm as this is a known cardiotoxic effect. Pt does have notable cardiac fam hx with father dying of an MI at age 26, however given this patient has had multiple stress test which she reports have all been normal and had a left heart cath in 2019 which demonstrated normal coronary arteries.  She is very well-appearing on presentation and given her unremarkable EKGs and troponins and timing with initiation of capecitabine presentation is most consistent with a drug-induced vasospasm.  Patient is not interested in getting a repeat heart cath.  - Monitor, plan for discharge in the morning  - NPO while monitoring in case symptoms recur and patient clinically declines at which point a cath could become necessary  - Patient's primary oncologist Dr. Wilber Oliphant, currently attending on service.  Defer decision about medication discontinuation to day team.    Stage IIIB (pT3N1b) L-sided pMMR Colon Adenocarcinoma: First presented with generalized abdominal pain found to have a mass in sigmoid colon with near total obstruction s/p open colectomy staged as T3N1b. Treatment course to date as outlined in HPI. Pt. of Dr. Rozell Searing, currently attending on service.    HTN: Home atenolol 25mg  at bedtime, lisinopril 2.5mg  daily  HLD: Home equivalent w/ atorva 10mg  daily   GERD: Home famotidine 40mg  BID  Anxiety: Home lexapro 20mg  daily  Neuropathy/Pain/Insomnia: Home gabapentin 300mg  nightly, tizanidine 4mg  nightly  Hypothyroidism: Home synthroid daily  OSA: Uses CPAP, didn't bring    Daily Checklist:  Diet: Regular Diet  DVT PPx: Lovenox 40mg  q24h  Electrolytes: No Repletion Needed  Code Status: Full Code    Chief Concern:   Chest pain    Subjective:   HPI:  Nina Mitchell is a 50 y.o. female with PMHx of HTN, T2DM, HLD, GERD, hypothyroidism, depression/anxiety, migraines, and recent diagnosis of stage IIIb (B1Y7W) left-sided colon adenocarcinoma (October 2022) s/p open sigmoid colectomy (09/05/21) and recent RIJ chest port placement (09/29/21) who presented w/ chest spasm pain in setting of recent initiation of Capecitabine.    Started capecitabine on 12/2. Was well tolerated initially, and in usual state of health into 12/7, when around 6/7pm had chest spasming like what I'd read about. Further described as central chest pressure. Took klonipin 0.5mg  and went away within the hour. Morning of 12/8 had recurrence of pain, took another klonipin and went away again after ~25/34min. Went to ED, had a total of ~6 episodes, some of them lasting just a few minutes. Last spasm ~8pm    Had maybe  a little nausea at some point, and once or twice had left wrist pain, but no other arm pain or other symptoms. No HA, emesis, diaphoresis, no radiation of pain to jaw.    Notably father died of MI at age 50, for which she has had closer cardiac monitoring with multiple reportedly normal stress tests. In 2019 was having intermittent chest pressure so had left heart cath which was overall unremarkable with normal coronaries.    In the ED she was hemodynamically stable. Had recurrence of CP for which she was given toradol and nitro and it resolved shortly after. Initial hsTrop 43, repeat 30. EKGs unremarkable, not significantly changed from 11/16 EKG. She was transferred to [facility] for further evaluation at request of consulting oncologist.    Oncology History:   Diagnosis: sigmoid colon adenocarcinoma  Current Goal of Therapy: adjuvant, curative  -??08/2021 patient underwent EGD and full colonoscopy in setting of generalized abdominal pain and change in bowel habits revealing a mass in sigmoid colon with near total obstruction that was biopsied as moderately differentiated adenocarcinoma. Also had a descending colon tubulovillous adenoma and a tubular adenoma in transverse colon. Stomach biopsy revealed chronic gastritis (no H. Pylori).??CEA 1.??  - 09/05/21 open sigmoid colectomy with primary anastomosis with pathology revealing moderately differentiated adenocarcinoma invading through MP into pericolonic tissues, negative margins, no PNI/LVI, no tumor deposits or perforation, 3/17 LNs positive for cancer, pMMR. Staged as pT3N1b  - 11/15-11/21/22 admitted with port infection (MSSA bacteremia) treated with nafcillin  - 10/24/21-current CAPOX x 4 cycles??  ??  Molecular: pMMR    Oncology History   Adenocarcinoma of sigmoid colon (CMS-HCC)   09/18/2021 Initial Diagnosis    Adenocarcinoma of sigmoid colon (CMS-HCC)     09/18/2021 -  Cancer Staged    Staging form: Colon and Rectum, AJCC 8th Edition  - Pathologic stage from 09/18/2021: Stage IIIB (pT3, pN1b, cM0) - Signed by Phil Dopp, MD on 09/18/2021       10/10/2021 - 10/10/2021 Chemotherapy    OP GI CAPECITABINE/OXALIPLATIN (ADJUVANT)  capecitabine 850 mg/m2 PO BID on days 1-14, oxaliplatin 130 mg/m2 IV on day 1, every 21 days     10/24/2021 -  Chemotherapy    OP GI CAPECITABINE/OXALIPLATIN (ADJUVANT)  capecitabine 850 mg/m2 PO BID on days 1-14, oxaliplatin 130 mg/m2 IV on day 1, every 21 days           Designated Healthcare Decision Maker:  [name] currently has decisional capacity for healthcare decision-making and is able to designate a surrogate healthcare decision maker. [name] designated healthcare decision maker(s) is/are [employer] investment banker (the patient's spouse) as denoted by stated patient preference.    Allergies:  Bupropion, Prochlorperazine maleate, Sulfa (sulfonamide antibiotics), Sulfamethoxazole-trimethoprim, Wellbutrin [bupropion hcl], Benzonatate, and Cefaclor    Medications:   Prior to Admission medications    Medication Dose, Route, Frequency   acetaminophen (TYLENOL) 500 MG tablet 500 mg, Oral, Every 12 hours PRN   atenolol (TENORMIN) 25 MG tablet 1 mg, Oral, Nightly   capecitabine (XELODA) 150 MG tablet 300 mg, Oral, 2 times a day (standard), Take Days 1 through 14, then 1 week off.   capecitabine (XELODA) 500 MG tablet 2,000 mg, Oral, 2 times a day (standard), Take Days 1 through 14, then 1 week off.   clonazePAM (KLONOPIN) 0.5 MG tablet 0.5 mg, Daily PRN   docusate sodium (COLACE) 100 MG capsule 100 mg, Oral, Nightly PRN   ESCITALOPRAM OXALATE ORAL 20 mg, Daily   famotidine (PEPCID)  40 MG tablet 40 mg, Oral, 2 times a day (standard)   fluconazole (DIFLUCAN) 150 MG tablet fluconazole 150 mg tablet   TAKE 1 TABLET BY MOUTH EVERY 72 HOURS   gabapentin (NEURONTIN) 300 MG capsule 300 mg, Oral, Nightly   levothyroxine (SYNTHROID) 25 MCG tablet 25 mcg, Oral, Daily (standard)   lisinopriL (PRINIVIL,ZESTRIL) 2.5 MG tablet 2.5 mg, Daily   loperamide (IMODIUM) 2 mg capsule If having diarrhea, take 2 capsules (4 mg) after the first loose stool, then 1 capsule every 2 hours until diarrhea free for 12 hours.   multivitamin with minerals tablet 1 tablet, Oral, Daily (standard)   OLANZapine (ZYPREXA) 5 MG tablet 5 mg, Oral, Nightly PRN   onabotulinumtoxinA (BOTOX) 200 unit SolR injection 200 Units, Intradermal, Every 3 months   ondansetron (ZOFRAN-ODT) 8 MG disintegrating tablet Take 1 tablet by mouth twice daily on days 2 and 3 of chemotherapy cycles. May take up to 1 tablet every 8 hours as needed (but do not take on day 1 of chemotherapy cycles).   oxyCODONE-acetaminophen (PERCOCET) 10-325 mg per tablet 1 tablet, Oral, Every 6 hours PRN   rizatriptan (MAXALT-MLT) 10 MG disintegrating tablet 10 mg, Oral, Daily PRN   rosuvastatin (CRESTOR) 5 MG tablet 5 mg, Oral, Every evening   semaglutide (OZEMPIC) 0.25 mg or 0.5 mg(2 mg/1.5 mL) PnIj injection Ozempic 0.25 mg or 0.5 mg (2 mg/1.5 mL) subcutaneous pen injector   INJECT 0.25MG  WEEKLY EVERY 4 WEEKS AND THEN INCREASE TO 0.5MG  WEEKLY   tiZANidine (ZANAFLEX) 4 MG tablet 4 mg, Oral, Every 8 hours PRN       Medical History:  Past Medical History:   Diagnosis Date   ??? Adenocarcinoma of sigmoid colon (CMS-HCC) 09/18/2021   ??? Anxiety    ??? Arthritis Jaw and other areas    2009   ??? Depression    ??? Disease of thyroid gland    ??? GERD (gastroesophageal reflux disease)    ??? HTN (hypertension)    ??? Mild intermittent asthma without complication    ??? Obesity        Surgical History:  Past Surgical History:   Procedure Laterality Date   ??? IR INSERT PORT AGE GREATER THAN 5 YRS  09/29/2021    IR INSERT PORT AGE GREATER THAN 5 YRS 09/29/2021 Trude Mcburney, MD IMG [facility]   ??? PR HYSTEROSCOPY,W/ENDOMETRIAL ABLATION N/A 04/17/2015    Procedure: HYSTEROSCOPY SURGICAL; W/ENDOMETRIAL ABLATION (EG, ENDOMETRIAL RESECT, ELECTROSURGICAL ABLATION, THERMOABL);  Surgeon: Orma Render, MD;  Location: OR [facility];  Service: Gynecology   ??? SINUS SURGERY     ??? TEMPOROMANDIBULAR JOINT SURGERY     ??? TYMPANOSTOMY TUBE PLACEMENT         Family History:   Family History   Problem Relation Age of Onset   ??? Cancer Mother    ??? Heart disease Mother    ??? Thyroid disease Mother    ??? Breast cancer Mother 73   ??? Heart disease Father    ??? COPD Maternal Grandmother    ??? Heart disease Maternal Grandmother        Social History:  The patient lives with family    Social History Tobacco Use   ??? Smoking status: Never   ??? Smokeless tobacco: Never   Substance Use Topics   ??? Alcohol use: Yes     Alcohol/week: 0.0 standard drinks     Types: 1 drink(s) per week     Comment: RARELY   ??? Drug use:  No        Review of Systems:  10 systems were reviewed and are negative unless otherwise mentioned in the HPI    Objective:   Physical Exam:  Temp:  [36.4 ??C (97.5 ??F)-36.8 ??C (98.2 ??F)] 36.8 ??C (98.2 ??F)  Heart Rate:  [81-98] 94  SpO2 Pulse:  [83-98] 98  Resp:  [8-28] 20  BP: (125-188)/(73-111) 147/94  SpO2:  [97 %-100 %] 100 %    Gen: WDWN in NAD, answers questions appropriately, pleasant to speak with  Eyes: Sclera anicteric, EOMI, PERRL  HENT: atraumatic, normocephalic   Heart: RRR, S1, S2, no M/R/G, no chest wall tenderness  Lungs: CTAB, no crackles or wheezes, no use of accessory muscles  Abdomen: Soft, NTND, no rebound/guarding, surgical scars well healed  Extremities: no clubbing, cyanosis, or edema  Neuro: CN II-XI grossly intact  Skin:  No rashes, lesions on clothed exam  Psych: Alert, oriented, normal mood and affect.     Labs/Studies/Imaging:  Labs, Studies, Imaging from the last 24hrs per EMR and personally reviewed    Jacob Moores, MD  Internal Medicine / Pediatrics - PGY4  10/31/2021

## 2021-10-31 NOTE — Unmapped (Signed)
Care Management  Initial Transition Planning Assessment  Type of Residence: Mailing Address:  [address]  [place] [address]  Contacts:    Patient Phone Number: [phone]          Medical Provider(s): Terrall Laity, [place]  Reason for Admission: Admitting Diagnosis:  Elevated troponin [R77.8]  Chest pain, unspecified type [R07.9]  Past Medical History:   has a past medical history of Adenocarcinoma of sigmoid colon (CMS-HCC) (09/18/2021), Anxiety, Arthritis (Jaw and other areas), Depression, Disease of thyroid gland, GERD (gastroesophageal reflux disease), HTN (hypertension), Mild intermittent asthma without complication, and Obesity.  Past Surgical History:   has a past surgical history that includes Temporomandibular joint surgery; Tympanostomy tube placement; Sinus surgery; pr hysteroscopy,w/endometrial ablation (N/A, 04/17/2015); and IR Insert Port Age Greater Than 5 Years (09/29/2021).   Previous admit date: 10/07/2021    Primary Insurance- Payor: STATE HEALTH PLAN / Plan: STATE HEALTH PLAN / Product Type: *No Product type* /   Secondary Insurance - English as a second language teacher  Prescription Coverage - State Health Plan and BCBS  [facility]acy - [phone] [place], [place] - [address]  [facility]    Transportation home: Private vehicle  Patient lives a home with husband. She recently completed a course of home IV abx with home infusion set up, But she says that has finished and her port has been removed. She is no longer receiving any HH services and has no DME in use except for a CPAP serviced through [facility] in [place]. Husband will drive patient home at discharge.                  General  Care Manager assessed the patient by : In person interview with patient, Medical record review  Orientation Level: Oriented X4  Functional level prior to admission: Independent  Reason for referral: Discharge Planning    Contact/Decision [facility]  Extended Emergency Contact Information  Primary Emergency Contact: Betsey Holiday  Address: [address]           [place], [place] [address] [place]  Home Phone: [phone]  Mobile Phone: [phone]  Relation: Spouse  Secondary Emergency Contact: Leonor Liv  Address: [address]           [place], [place] [address] [place]  Home Phone: [phone]  Relation: Brother    Armed forces operational officer Next of Kin / Guardian / POA / Advance Directives     HCDM (patient stated preference): Betsey Holiday - Spouse - [phone]    Advance Directive (Medical Treatment)  Does patient have an advance directive covering medical treatment?: Patient does not have advance directive covering medical treatment.  Reason patient does not have an advance directive covering medical treatment:: Patient does not wish to complete one at this time.    Health Care Decision Maker [HCDM] (Medical & Mental Health Treatment)  Healthcare Decision Maker: Patient does not wish to appoint a Health Care Decision Maker at this time  Information offered on HCDM, Medical & Mental Health advance directives:: Patient declined information.    Advance Directive (Mental Health Treatment)  Does patient have an advance directive covering mental health treatment?: Patient does not have advance directive covering mental health treatment.  Reason patient does not have an advance directive covering mental health treatment:: Patient does not wish to complete one at this time.    Readmission Information    Have you been hospitalized in the last 30 days?: Yes  Name of Hospital: [facility]  Were you being cared for at a skilled nursing facility:: No     What day were you discharged from that hospital or facility?: 10/13/21  Number of Days between previous discharge and readmission date: 15-30 days    Type of Readmission: Unplanned readmission due to new medical issue/unrelated to previous admission    Readmission Source: Home       Did the following happen with your discharge?  Which services or equipment arranged after your discharge arrived?: Home Health    Did you understand your discharge instructions?: Yes       Contributing Factors for Readmission: New diagnosis -not related to previous admission    Do you think your readmission could have been prevented?: No    Patient Information  Lives with: Spouse/significant other    Type of Residence: Private residence    Support Systems/Concerns: Significant Other    Responsibilities/Dependents at home?: No    Home Care services in place prior to admission?: No (Recently completed course of home IV abx.)    Equipment Currently Used at Home: respiratory supplies (CPAP)  Current HME Agency (Name/Phone #): [facility] in [place]    Currently receiving outpatient dialysis?: No       Financial Information     Need for financial assistance?: No       Social Determinants of Health     Food Insecurity: No Food Insecurity   ??? Worried About Programme researcher, broadcasting/film/video in the Last Year: Never true   ??? Ran Out of Food in the Last Year: Never true   Tobacco Use: Low Risk    ??? Smoking Tobacco Use: Never   ??? Smokeless Tobacco Use: Never   ??? Passive Exposure: Not on file   Transportation Needs: No Transportation Needs   ??? Lack of Transportation (Medical): No   ??? Lack of Transportation (Non-Medical): No   Alcohol Use: Not on file   Housing/Utilities: Low Risk    ??? Within the past 12 months, have you ever stayed: outside, in a car, in a tent, in an overnight shelter, or temporarily in someone else's home (i.e. couch-surfing)?: No   ??? Are you worried about losing your housing?: No   ??? Within the past 12 months, have you been unable to get utilities (heat, electricity) when it was really needed?: No   Substance Use: Not on file   Financial Resource Strain: Low Risk    ??? Difficulty of Paying Living Expenses: Not hard at all   Physical Activity: Not on file   Health Literacy: Low Risk    ??? : Never   Stress: Not on file   Intimate Partner Violence: Not on file   Depression: Not on file   Social Connections: Not on file       Complex Discharge Information    Is patient identified as a difficult/complex discharge?: No    Discharge Needs Assessment  Concerns to be Addressed: discharge planning    Clinical Risk Factors: Multiple Diagnoses (Chronic)    Barriers to taking medications: No    Prior overnight hospital stay or ED visit in last 90 days: Yes    Anticipated Changes Related to Illness: none    Equipment Needed After Discharge: none    Discharge Facility/Level of Care Needs:      Readmission  Risk of Unplanned Readmission Score:  %  Predictive Model Details   No score data available for [facility] Risk of Unplanned  Readmission     Readmitted Within the Last 30 Days? (No if blank)   Patient at risk for readmission?: No    Discharge Plan  Screen findings are: Care Manager reviewed the plan of the patient's care with the Multidisciplinary Team. No discharge planning needs identified at this time. Care Manager will continue to manage plan and monitor patient's progress with the team.    Expected Discharge Date: 10/31/2021    Expected Transfer from Critical Care:  (N/A)    Quality data for continuing care services shared with patient and/or representative?: N/A  Patient and/or family were provided with choice of facilities / services that are available and appropriate to meet post hospital care needs?: N/A       Initial Assessment complete?: Yes      Mauri Brooklyn, RN  October 31, 2021 10:48 AM

## 2021-10-31 NOTE — Unmapped (Signed)
Division of Cardiology       Date of Service: 10/31/21    Cardiology Consultation Note    Referring Attending:  Phil Dopp, MD Consulting Attending:  Warnell Forester, MD   Referring Service:  Oncology/Hematology ([facility]) Consulting Fellow:  Marin Olp, MD   Reason for Consultation: chest pain      Assessment and Plan:        [name] is a 50 y.o. female with history of Stage IIIb colon adenoCA (s/p open sigmoid colectomy 08/2021), morbid obesity, OSA, DM2, HTN, HLD, GERD, hypothyroidism, MDD/anxiety, migraines who p/w central chest pressure iso recent capecitabine (12/2) and zyprexa (12/6) initiation.    #CP 2/2 drug-induced coronary vasospasm vs. CAD progression  Patient's episodes of intermittent CP could certainly be caused by her recent capecitabine initiation, which is known to trigger coronary vasospasm (~5%) and corresponds temporally to the onset of symptoms. She has also started Zyprexa (for uncontrolled nausea) for the first time, also known to cause CP, but has only taken a single dose prior to her first episode. Moreover, her CP has ceased with discontinuation of her capecitabine. Patient is not amenable to catheterization or for trial of capecitabine while on telemetry and was outside the HR parameters for coronary CTA, so plan to proceed with PET stress test in the outpatient setting. If this is suggestive of clinically significant CAD (representing a progression from her known ~20% proximal LAX stenosis from 2019), then patient would be amenable to resuming capecitabine. If none is identified, then patient would consider revisiting capecitabine therapy, seeking an alternative chemotherapeutic agent, or altogether not completing adjuvant chemo. Recommend OP follow-up with cardiology and replacing atenolol with 25mg  metoprolol succinate daily for better rate control.  -- Cardiology f/u   -- PET stress test in OP setting  -- Metoprolol succinate 25mg  qD  -- DC atenolol 25mg  qHS  -- Hold capecitabine pending stress test    Thank you for involving [place] in this patient's care. We will continue to follow and provide recommendations as needed. This patient was seen and discussed with Dr. Katrinka Blazing who is in agreement with the above recommendations. If any further questions arise, please page the Cardiology Consult pager Monday to Friday from 8am to 5pm or the on-call Cardiology pager nights and weekends.      Subjective:        Reason for Consultation: chest pain    History of Present Illness  Ms.  is a 50 y.o. female with a history of Stage IIIb colon adenoCA (s/p open sigmoid colectomy 08/2021), morbid obesity, OSA, DM2, HTN, HLD, GERD, hypothyroidism, MDD/anxiety, migraines who p/w central chest pressure iso recent capecitabine (12/2) and zyprexa (12/6) initiation. She was in her usual state of health until 12/7 PM, when she developed what she describes as a series of chest spasms. Patient went on to have 6 of these episodes, each lasting several minutes at a time, and appearing to resolve with PO clonazepam. There was occasional associated nausea, but no vomiting, SOB, HA, neck, jaw, or abdominal pain.    In the past, patient has been worked up for intermittent chest pain at rest for which she received LHC in 05/2018; this and CTA at the time were unremarkable, apart from some mild (~20% occluded proximal LAD) stenosis. These events were attributed to exacerbations of her underlying GERD. Since this time, she has also received a number of stress echocardiography studies which were unrevealing    Patient HDS upon presentation to the ED, apart  from hypertension to 188/100. Mild troponinemia peaking at 43, since downtrended to 30. Electrolytes unremarkable. CXR, TTE, CTA clear. EKG without evidence of active ischemic changes; two EKGs performed in ED suggest possible R wave progression, however this may be secondary to incorrect lead placement in light of rapid resolution and patient's overall clinical presentation. Patient ASA loaded She experienced an episode of CP ~2000 on 12/8, which appeared to resolve within minutes of NG and toradol administration. Capecitabine has been held.    Risk Factors:  diabetes mellitus, dyslipidemia, family history of premature cardiovascular disease, hypertension, obesity (BMI >= 30 kg/m2) and sedentary lifestyle    Pertinent Medical History  Reviewed in Epic    Social History  She  reports that she has never smoked. She has never used smokeless tobacco. She reports current alcohol use. She reports that she does not use drugs.    Family History  Her family history includes Breast cancer (age of onset: 75) in her mother; COPD in her maternal grandmother; Cancer in her mother; Heart disease in her father, maternal grandmother, and mother; Thyroid disease in her mother.       Review of Systems  10 systems were reviewed and negative except as noted in HPI.      Objective:     Vitals  BP 148/87  - Pulse 94  - Temp 36.5 ??C (97.7 ??F) (Oral)  - Resp 16  - Ht 167.6 cm (5' 6)  - Wt (!) 125.7 kg (277 lb 1.9 oz)  - SpO2 98%  - BMI 44.73 kg/m??    Wt Readings from Last 3 Encounters:   10/31/21 (!) 125.7 kg (277 lb 1.9 oz)   10/24/21 (!) 125.2 kg (276 lb)   10/24/21 (!) 125.5 kg (276 lb 11.2 oz)       Physical Exam  GEN: Pleasant obese female calming resting in bed in NED.  HEENT: EOMI, sclera anicteric. MMM. No carotid bruits  CARDIOVASCULAR: Normal rate, regular rhythm, no m/g/r, 2+radial/equal, 2+DP/equal, nl cap refill  PULMONARY: CTAB, no increased work of breathing  ABDOMEN: Soft, NT/ND, normoactive BS  EXTREMITIES: Warm and well perfused, no cyanosis or edema  NEURO: AAOx3. Cranial nerves II through XII grossly intact  PSYCH: Alert, normal affect    ECG (10/31/21)  NSR. Low voltages likely 2/2 habitus    Current Medications  ??? atenoloL  25 mg Oral Nightly   ??? atorvastatin  10 mg Oral Nightly   ??? enoxaparin (LOVENOX) injection  40 mg Subcutaneous Q24H   ??? escitalopram oxalate  20 mg Oral Daily   ??? famotidine  40 mg Oral BID   ??? gabapentin  300 mg Oral Nightly   ??? levothyroxine  25 mcg Oral daily   ??? lisinopriL  2.5 mg Oral Daily   ??? tiZANidine  4 mg Oral Nightly       Most Recent Labs   Lab Results   Component Value Date    NA 138 10/31/2021    K 3.6 10/31/2021    CL 102 10/31/2021    CO2 28.0 10/31/2021    BUN 10 10/31/2021    CREATININE 0.50 (L) 10/31/2021    GLU 272 (H) 10/31/2021    CALCIUM 9.3 10/31/2021    ALBUMIN 3.9 10/30/2021    PHOS 3.4 10/31/2021     Lab Results   Component Value Date    WBC 7.9 10/30/2021    RBC 4.64 10/30/2021    HGB 12.0 10/30/2021    HCT 37.1 10/30/2021  MCV 79.9 10/30/2021    MCH 25.8 (L) 10/30/2021    MCHC 32.3 10/30/2021    RDW 17.4 (H) 10/30/2021    PLT 399 10/30/2021    MPV 7.8 10/30/2021     Lab Results   Component Value Date    INR 1.18 10/09/2021     Lab Results   Component Value Date    Troponin I <0.300 04/01/2017    hsTroponin I 30 10/30/2021    hsTroponin I 43 (HH) 10/30/2021

## 2021-11-01 NOTE — Unmapped (Signed)
Physician Discharge Summary [facility]  1 [facility] OBSERVATION [facility]  [address]  [place] [address]  Dept: [phone]  Loc: [phone]     Identifying Information:   Nina Mitchell  [birth year]  [id number]    Primary Care Physician: Terrall Laity, PA     Referring Physician: Referred Self     Code Status: Full Code    Admit Date: 10/30/2021    Discharge Date: 10/31/2021     Discharge To: Home    Discharge Service: [facility] - Oncology Floor Team (MEDO)     Discharge Attending Physician: Phil Dopp, MD    Discharge Diagnoses:  Principal Problem:    Chest pain  Active Problems:    Gastroesophageal reflux disease without esophagitis    Situational mixed anxiety and depressive disorder    Obstructive sleep apnea syndrome    HTN (hypertension)    Anxiety    Adenocarcinoma of sigmoid colon (CMS-HCC)  Resolved Problems:    * No resolved hospital problems. *      Outpatient Provider Follow Up Issues:   Supportive Care Recommendations:  We recommend based on the patient???s underlying diagnosis and treatment history the following supportive care:    1. Antimicrobial prophylaxis:  None    2. Blood product support:  Leukoreduced blood products are required.  Irradiated blood products are preferred, but in case of urgent transfusion needs non-irradiated blood products may be used:     -  No special recommendations.  Follow internal standard practices.    3. Hematopoietic growth factor support: none    Hospital Course:   Nina Mitchell is a 50 y.o. female with PMHx of HTN, T2DM, HLD, GERD, hypothyroidism, depression/anxiety, migraines, and recent diagnosis of stage IIIb (W4X3K) left-sided colon adenocarcinoma (October 2022) s/p open sigmoid colectomy (09/05/21) and recent RIJ chest port placement (09/29/21) who presented w/ chest spasm pain in setting of recent initiation of Capecitabine.      Chest Pain c/f spasm in setting of capecitabine: Several episodes of chest pain from 12/7 to 12/8 with no changes on EKG and unremarkable hsTrop of 43 and 30 on repeat most c/w capecitabine induced coronary vasospasm as this is a known cardiotoxic effect. Pt does have notable cardiac fam hx with father dying of an MI at age 24, however given this patient has had multiple stress test which she reports have all been normal and had a left heart cath in 2019 which demonstrated normal coronary arteries.  She was very well-appearing on presentation and given her unremarkable EKGs and troponins and timing with initiation of capecitabine presentation is most consistent with a drug-induced vasospasm. Cardiology was consulted to evaluate for possible coronary artery disease who recommended a PET stress test and outpatient follow up. They would be able to finish their workup before her next capecitabine dose on 11/14/2021.   - Monitored during admission for decompensation  - Will finish cardiac workup before next dose of chemo on 11/14/2021 (PET stress test)  - hold triptans in setting of chest pain episodes     Stage IIIB (pT3N1b) L-sided pMMR Colon Adenocarcinoma: First presented with generalized abdominal pain found to have a mass in sigmoid colon with near total obstruction s/p open colectomy staged as T3N1b. Treatment course to date as outlined in HPI. Pt. of Dr. Rozell Searing, currently attending on service.     HTN: Home atenolol 25mg  at bedtime, but changed to metoprolol succinate 25mg  daily per cardiology; lisinopril 2.5mg  daily  HLD: Home equivalent w/ atorva 10mg   daily   GERD: Home famotidine 40mg  BID  Anxiety: Home lexapro 20mg  daily  Neuropathy/Pain/Insomnia: Home gabapentin 300mg  nightly, tizanidine 4mg  nightly  Hypothyroidism: Home synthroid daily  OSA: Uses CPAP, didn't bring      Procedures:  None  No admission procedures for hospital encounter.  ______________________________________________________________________  Discharge Medications:     Your Medication List      STOP taking these medications    atenoloL 25 MG tablet  Commonly known as: TENORMIN     capecitabine 150 MG tablet  Commonly known as: XELODA     capecitabine 500 MG tablet  Commonly known as: XELODA     docusate sodium 100 MG capsule  Commonly known as: COLACE     OLANZapine 5 MG tablet  Commonly known as: ZyPREXA        START taking these medications    metoprolol succinate 25 MG 24 hr tablet  Commonly known as: TOPROL XL  Take 1 tablet (25 mg total) by mouth daily.        CHANGE how you take these medications    rizatriptan 10 MG disintegrating tablet  Commonly known as: MAXALT-MLT  Take 1 tablet (10 mg total) by mouth daily as needed for migraine. Avoid taking if you have episodes of chest pain. This medication could worsen vasospasms.  What changed:   ?? reasons to take this  ?? additional instructions        CONTINUE taking these medications    clonazePAM 0.5 MG tablet  Commonly known as: KlonoPIN  0.5 mg daily as needed (for anxiety or nausea).     escitalopram oxalate 20 MG tablet  Commonly known as: LEXAPRO  Take 20 mg by mouth at bedtime.     famotidine 40 MG tablet  Commonly known as: PEPCID  Take 1 tablet (40 mg total) by mouth Two (2) times a day.     gabapentin 300 MG capsule  Commonly known as: NEURONTIN  Take 300 mg by mouth nightly.     levothyroxine 25 MCG tablet  Commonly known as: SYNTHROID  Take 25 mcg by mouth daily.     lisinopriL 2.5 MG tablet  Commonly known as: PRINIVIL,ZESTRIL  Take 2.5 mg by mouth at bedtime.     loperamide 2 mg capsule  Commonly known as: IMODIUM  If having diarrhea, take 2 capsules (4 mg) after the first loose stool, then 1 capsule every 2 hours until diarrhea free for 12 hours.     multivitamin with minerals tablet  Take 1 tablet by mouth daily.     onabotulinumtoxinA 200 unit Solr injection  Commonly known as: BOTOX  Inject 200 Units into the skin Every three (3) months.     ondansetron 8 MG disintegrating tablet  Commonly known as: ZOFRAN-ODT  Take 1 tablet by mouth twice daily on days 2 and 3 of chemotherapy cycles. May take up to 1 tablet every 8 hours as needed (but do not take on day 1 of chemotherapy cycles).     oxyCODONE-acetaminophen 10-325 mg per tablet  Commonly known as: PERCOCET  Take 1 tablet by mouth every six (6) hours as needed.     OZEMPIC 0.25 mg or 0.5 mg(2 mg/1.5 mL) Pnij injection  Generic drug: semaglutide  Ozempic 0.25 mg or 0.5 mg (2 mg/1.5 mL) subcutaneous pen injector   INJECT 0.25MG  WEEKLY EVERY 4 WEEKS AND THEN INCREASE TO 0.5MG  WEEKLY     rosuvastatin 5 MG tablet  Commonly known as: CRESTOR  Take  5 mg by mouth every evening.     tiZANidine 4 MG tablet  Commonly known as: ZANAFLEX  Take 4 mg by mouth every eight (8) hours as needed.            Allergies:  Bupropion, Prochlorperazine maleate, Sulfa (sulfonamide antibiotics), Sulfamethoxazole-trimethoprim, Wellbutrin [bupropion hcl], Benzonatate, and Cefaclor  ______________________________________________________________________  Pending Test Results (if blank, then none):      Most Recent Labs:  All lab results last 24 hours -   Recent Results (from the past 24 hour(s))   COVID-19 PCR    Collection Time: 10/30/21  8:14 PM    Specimen: Nasopharyngeal Swab   Result Value Ref Range    SARS-CoV-2 PCR Negative Negative   ECG 12 Lead    Collection Time: 10/31/21  6:20 AM   Result Value Ref Range    EKG Systolic BP  mmHg    EKG Diastolic BP  mmHg    EKG Ventricular Rate 85 BPM    EKG Atrial Rate 85 BPM    EKG P-R Interval 168 ms    EKG QRS Duration 90 ms    EKG Q-T Interval 406 ms    EKG QTC Calculation 483 ms    EKG Calculated P Axis 19 degrees    EKG Calculated R Axis 12 degrees    EKG Calculated T Axis 21 degrees    QTC Fredericia 456 ms   Magnesium Level    Collection Time: 10/31/21  7:13 AM   Result Value Ref Range    Magnesium 1.6 1.6 - 2.6 mg/dL   Phosphorus Level    Collection Time: 10/31/21  7:13 AM   Result Value Ref Range    Phosphorus 3.4 2.4 - 5.1 mg/dL   Basic Metabolic Panel    Collection Time: 10/31/21  7:13 AM   Result Value Ref Range    Sodium 138 135 - 145 mmol/L    Potassium 3.6 3.4 - 4.8 mmol/L    Chloride 102 98 - 107 mmol/L    CO2 28.0 20.0 - 31.0 mmol/L    Anion Gap 8 5 - 14 mmol/L    BUN 10 9 - 23 mg/dL    Creatinine 1.61 (L) 0.60 - 0.80 mg/dL    BUN/Creatinine Ratio 20     eGFR CKD-EPI (2021) Female >90 >=60 mL/min/1.66m2    Glucose 272 (H) 70 - 179 mg/dL    Calcium 9.3 8.7 - 09.6 mg/dL     Microbiology -   Microbiology Results (last day)     Procedure Component Value Date/Time Date/Time    COVID-19 PCR [[id number]]  (Normal) Collected: 10/30/21 2014    Lab Status: Final result Specimen: Nasopharyngeal Swab Updated: 10/30/21 2131     SARS-CoV-2 PCR Negative    Narrative:      --  This test was performed using the Cepheid Xpert Xpress SARS-CoV-2 assay which has been validated by the CLIA-certified, CAP-inspected [facility] Clinical Molecular Micro[facility] Laboratory and [facility]. FDA has granted Emergency Use Authorization for this test.   This real-time RT-PCR test detects SARS-CoV-2 by targeting the N2 and E genes. Negative results do not preclude SARS-CoV-2 infection and should not be used as the sole basis for patient management decisions. Negative results must be combined with clinical observations, patient history, and epidemiological information.   Information for providers and patients can be found here: [email or web address]            Relevant Studies/Radiology (if blank, then none):  ECG  12 Lead    Result Date: 10/31/2021  NORMAL SINUS RHYTHM CANNOT RULE OUT ANTERIOR INFARCT  (CITED ON OR BEFORE 07-Oct-2021) ABNORMAL ECG WHEN COMPARED WITH ECG OF 30-Oct-2021 13:35, NO SIGNIFICANT CHANGE WAS FOUND    ECG 12 Lead    Result Date: 10/30/2021  NORMAL SINUS RHYTHM MINIMAL VOLTAGE CRITERIA FOR LVH, MAY BE NORMAL VARIANT POOR R WAVE PROGRESSION IN PRECORDIAL LEADS , CANNOT RULE OUT ANTERIOR INFARCT ABNORMAL ECG WHEN COMPARED WITH ECG OF 30-Oct-2021 11:18, NO SIGNIFICANT CHANGE WAS FOUND Confirmed by Huel Coventry [phone]) on 10/30/2021 9:11:15 PM    ECG 12 Lead    Result Date: 10/30/2021  NORMAL SINUS RHYTHM CANNOT RULE OUT ANTEROLATERAL INFARCT ABNORMAL ECG WHEN COMPARED WITH ECG OF 07-Oct-2021 14:02, NO SIGNIFICANT CHANGE WAS FOUND Confirmed by Huel Coventry ([id number]) on 10/30/2021 9:02:47 PM    XR Chest 2 views    Result Date: 10/30/2021  EXAM: XR CHEST 2 VIEWS DATE: 10/30/2021 1:38 PM ACCESSION: [id number] UN DICTATED: 10/30/2021 1:44 PM INTERPRETATION LOCATION: [facility] CLINICAL INDICATION: 50 years old Female with CHEST PAIN  COMPARISON: 10/07/2021 and prior chest radiograph. Chest CT dated 09/29/2021. TECHNIQUE: PA and Lateral Chest Radiographs. FINDINGS: The lungs appear clear. No pleural effusion or pneumothorax is identified. The cardiomediastinal silhouette appears similar to the prior exam. Surgical clips project in the right upper quadrant.     No evidence of acute cardiopulmonary pathology.    Echocardiogram W Colorflow Spectral Doppler With Contrast    Result Date: 10/31/2021  Patient Info Name:     Kyeisha   Age:     50 years DOB:     07-16-1971 Gender:     Female MRN:     [id number] Accession #:     [id number] UN Ht:     168 cm Wt:     126 kg BSA:     2.49 m2 HR:     94 bpm BP:     145 /     80 mmHg Technical Quality:     Fair Exam Date:     10/31/2021 1:53 PM Site Location:     [facility] Exam Location:     [facility] Admit Date:     10/30/2021 Exam Type:     ECHOCARDIOGRAM W COLORFLOW SPECTRAL DOPPLER W CONTRAST Study Info Indications      - chest pain in setting of colon cancer chemotherapy treatment ; Definity/Optison if needed Complete two-dimensional, color flow and Doppler transthoracic echocardiogram is performed with contrast to opacify the left ventricle and to improve the delineation of the left ventricle endocardial borders. Staff Referring Physician:     [phone], CHILCUTT; Reading Fellow:     Elana Alm MD Sonographer:     Metro Kung Ordering Physician:     Domingo Cocking Account #:     [id number] Ultrasound Enhancing Agent/Agitated Saline ------------------------------ UEA/Ag. Saline:     Lumason Amount:     --- ml Summary   1. The left ventricle is normal in size with normal wall thickness.   2. The left ventricular systolic function is normal, LVEF is visually estimated at 60-65%.   3. The right ventricle is normal in size, with normal systolic function. Left Ventricle   The left ventricle is normal in size with normal wall thickness.   The left ventricular systolic function is normal, LVEF is visually estimated at 60-65%.   There is normal left ventricular diastolic function. Right Ventricle   The right ventricle is normal in size, with normal systolic function. Left  Atrium   The left atrium is normal in size. Right Atrium   The right atrium is not well visualized but probably normal  in size. Aortic Valve   The aortic valve is probably trileaflet with normal appearing leaflets with normal excursion.   There is no significant aortic regurgitation. Pulmonic Valve   The pulmonic valve is poorly visualized, but probably normal.   There is no significant pulmonic regurgitation.   There is no evidence of a significant transvalvular gradient. Mitral Valve   The mitral valve leaflets are normal with normal leaflet mobility.   There is no significant mitral valve regurgitation. Tricuspid Valve   The tricuspid valve leaflets are normal, with normal leaflet mobility.   There is no significant tricuspid regurgitation. Other Findings   Rhythm: Sinus Rhythm. Pericardium/Pleural   There is no pericardial effusion. Inferior Vena Cava   IVC size and inspiratory change suggest normal right atrial pressure. (0-5 mmHg). Aorta   The aorta is normal in size in the visualized segments. Pulmonic Valve ---------------------------------------------------------------------- Name                                 Value        Normal ---------------------------------------------------------------------- PV Doppler ---------------------------------------------------------------------- PV Peak Velocity                   1.2 m/s Mitral Valve ---------------------------------------------------------------------- Name                                 Value        Normal ---------------------------------------------------------------------- MV Diastolic Function ---------------------------------------------------------------------- MV E Peak Velocity                 53 cm/s               MV A Peak Velocity                 85 cm/s               MV E/A                                 0.6               MV Annular TDI ---------------------------------------------------------------------- MV Septal e' Velocity             5.8 cm/s         >=8.0 MV Lateral e' Velocity            9.0 cm/s        >=10.0 MV e' Average                          7.4               MV E/e' (Average)                      7.5 Tricuspid Valve ---------------------------------------------------------------------- Name                                 Value        Normal ---------------------------------------------------------------------- Estimated PAP/RSVP ---------------------------------------------------------------------- RA Pressure  3 mmHg           <=5 Aorta ---------------------------------------------------------------------- Name                                 Value        Normal ---------------------------------------------------------------------- Ascending Aorta ---------------------------------------------------------------------- Ao Root Diameter (2D)               3.7 cm               Ao Root Diam Index (2D)         14.9 cm/m2               Asc Ao Diameter                     3.8 cm Aortic Valve ---------------------------------------------------------------------- Name                                 Value        Normal ---------------------------------------------------------------------- AV Doppler ---------------------------------------------------------------------- AV Peak Velocity                   1.3 m/s Ventricles ---------------------------------------------------------------------- Name                                 Value        Normal ---------------------------------------------------------------------- LV Dimensions 2D/MM ---------------------------------------------------------------------- IVS Diastolic Thickness (2D)        1.2 cm       0.6-0.9 LVID Diastole (2D)                  3.9 cm       3.8-5.2 LVPW Diastolic Thickness (2D)                                1.2 cm       0.6-0.9 LVID Systole (2D)                   2.0 cm       2.2-3.5 RV Dimensions 2D/MM ---------------------------------------------------------------------- RV Basal Diastolic Dimension        3.9 cm       2.5-4.1 TAPSE                               2.3 cm         >=1.7 Atria ---------------------------------------------------------------------- Name                                 Value        Normal ---------------------------------------------------------------------- LA Dimensions ---------------------------------------------------------------------- LA Dimension (2D)                   4.5 cm       2.7-3.8 LA Volume (BP MOD)                   52 ml               LA Volume Index (BP MOD)       21.03 ml/m2   16.00-34.00 Report Signatures Resident Lorenda Hatchet  MD on  10/31/2021 03:40 PM    CTA Chest W Contrast    Result Date: 10/30/2021  EXAM: CTA Chest for Pulmonary Embolus DATE: 10/30/2021 3:40 PM ACCESSION: [id number] UN DICTATED: 10/30/2021 3:43 PM INTERPRETATION LOCATION: [facility] CLINICAL INDICATION: 50 years old Female with chest tightness, concern for PE. Colon cancer.  COMPARISON: 1222 chest radiograph. Chest CT dated 09/29/2021. TECHNIQUE: Contiguous 1 and 2 mm axial images were reconstructed through the chest following a single breath hold helical acquisition.  Images were reformatted in the sagittal and coronal planes.  Multiplanar MIP slabs were created for more thorough evaluation of the pulmonary vasculature. Intravenous contrast was administered. The examination was performed with limited z-axis coverage and reduced kVp to improve vascular opacification and reduce breath hold and contrast and radiation dose. kVP:  100 DLP:  112.8 FINDINGS: Evaluation limited by high image noise and artifact from motion artifact. PULMONARY ARTERIES: No definitive emboli in either lung. Small apparent filling defect in one in the medial right lower lobe pulmonary arteries (5:70) is favored to represent artifact. The right heart is not dilated. LUNGS AND AIRWAYS: Hypoventilatory exam. Bibasilar linear atelectasis. Previously described bilateral pulmonary nodules were better demonstrated on 09/29/2021 chest CTA. Lungs are otherwise clear. The central airways are patent. PLEURA: No pleural fluid or pneumothorax. MEDIASTINUM AND LYMPH NODES: No enlarged intrathoracic or axillary lymph nodes are present.    No other mediastinal abnormalities are present. HEART AND VASCULATURE:  Cardiomegaly.  Ascending and descending aorta normal in caliber.    There is no pericardial effusion. BONES AND SOFT TISSUES: Previously described lytic and sclerotic foci in the vertebral bodies and ribs were better demonstrated on 09/29/2021 chest CT. Degenerative changes of the spine. No acute soft tissue abnormality within the field-of-view. UPPER ABDOMEN: Hepatic steatosis. Splenomegaly.     Examination limited by image noise. No definitive pulmonary emboli and no acute findings. Hepatic steatosis. Splenomegaly.  The findings of this study were discussed via Epic secure chat with confirmed receipt and response with DR. Marzella Schlein ALLEN by Dr. Renato Battles on 10/30/2021 4:05 PM.     ______________________________________________________________________    Activity Instructions Activity as tolerated                Other Instructions     Call MD for:  difficulty breathing, headache or visual disturbances      Call MD for:  persistent nausea or vomiting      Call MD for:  severe uncontrolled pain      Call MD for:  temperature >38.5 Celsius      Discharge instructions      It was a pleasure taking care of you!    Diagnosis: Chest Pain likely due to Coronary Vasospasm    Course complications:  -  None    New/Important Medications:  Current Discharge Medication List    START taking these medications    metoprolol succinate (TOPROL XL) 25 MG 24 hr tablet  Take 1 tablet (25 mg total) by mouth daily.  Qty: 30 tablet Refills: 0      CONTINUE these medications which have CHANGED    rizatriptan (MAXALT-MLT) 10 MG disintegrating tablet  Take 1 tablet (10 mg total) by mouth daily as needed for migraine. Avoid taking if you have episodes of chest pain. This medication could worsen vasospasms.  Qty: 30 tablet Refills: 0      CONTINUE these medications which have NOT CHANGED    clonazePAM (KLONOPIN) 0.5 MG tablet  0.5 mg daily as needed (for  anxiety or nausea).    escitalopram oxalate (LEXAPRO) 20 MG tablet  Take 20 mg by mouth at bedtime.    famotidine (PEPCID) 40 MG tablet  Take 1 tablet (40 mg total) by mouth Two (2) times a day.  Qty: 60 tablet Refills: 0    gabapentin (NEURONTIN) 300 MG capsule  Take 300 mg by mouth nightly.    levothyroxine (SYNTHROID) 25 MCG tablet  Take 25 mcg by mouth daily.    lisinopriL (PRINIVIL,ZESTRIL) 2.5 MG tablet  Take 2.5 mg by mouth at bedtime.    loperamide (IMODIUM) 2 mg capsule  If having diarrhea, take 2 capsules (4 mg) after the first loose stool, then 1 capsule every 2 hours until diarrhea free for 12 hours.  Qty: 60 capsule Refills: 11  Associated Diagnoses:Adenocarcinoma of sigmoid colon (CMS-HCC)    multivitamin with minerals tablet  Take 1 tablet by mouth daily.     onabotulinumtoxinA (BOTOX) 200 unit SolR injection  Inject 200 Units into the skin Every three (3) months.    ondansetron (ZOFRAN-ODT) 8 MG disintegrating tablet  Take 1 tablet by mouth twice daily on days 2 and 3 of chemotherapy cycles. May take up to 1 tablet every 8 hours as needed (but do not take on day 1 of chemotherapy cycles).  Qty: 30 tablet Refills: 2  Associated Diagnoses:Adenocarcinoma of sigmoid colon (CMS-HCC)    oxyCODONE-acetaminophen (PERCOCET) 10-325 mg per tablet  Take 1 tablet by mouth every six (6) hours as needed.   Refills: 0    rosuvastatin (CRESTOR) 5 MG tablet  Take 5 mg by mouth every evening.    semaglutide (OZEMPIC) 0.25 mg or 0.5 mg(2 mg/1.5 mL) PnIj injection  Ozempic 0.25 mg or 0.5 mg (2 mg/1.5 mL) subcutaneous pen injector   INJECT 0.25MG  WEEKLY EVERY 4 WEEKS AND THEN INCREASE TO 0.5MG  WEEKLY    tiZANidine (ZANAFLEX) 4 MG tablet  Take 4 mg by mouth every eight (8) hours as needed.       STOP taking these medications    atenolol (TENORMIN) 25 MG tablet  Comments:  Reason for Stopping:    capecitabine (XELODA) 150 MG tablet  Comments:  Reason for Stopping:    capecitabine (XELODA) 500 MG tablet  Comments:  Reason for Stopping:    docusate sodium (COLACE) 100 MG capsule  Comments:  Reason for Stopping:    OLANZapine (ZYPREXA) 5 MG tablet  Comments:  Reason for Stopping:    Follow up Appointments:   - Cardiology will follow up with you on when your PET Stress test and outpatient follow appointments are; please follow up with them if you do not hear back from them  - Follow up with your oncologist to restart chemotherapy after your cardiac evaluation    --------------    When to Call Your [facility] Cancer Care Team:   Monday- Friday from 8:00 am - 5:00 pm: Call [phone] or Toll free [phone].  Ask to speak to the Triage Nurse  On Nights, Weekends and Holidays: Call [phone]. Ask the operator to page the Oncology Fellow on Call     RED ZONE:  Take action now!  You need to be seen right away. Call 911 or go to your nearest hospital for help.   - Symptoms are at a severe level of discomfort    - Bleeding that will not stop  - Chest Pain    - Hard to breathe    - Fall or passing out    - New  Seizure    - Thoughts of hurting yourself or others     YELLOW ZONE: Take action today  This is NOT an all-inclusive list. Pleae call with any new or worsening symptoms.   Call your doctor, nurse or other healthcare provider at [phone]  You can be seen by a provider the same day through our Same Day Acute Care for Patients with Cancer program.   - Symptoms are new or worsening; You are not within your goal range for:    - Pain          - Swelling (leg, arm, abdomen, face, neck)    - Shortness of breath        - Skin rash or skin changes    - Bleeding (nose, urine, stool, wound)    - Wound issues (redness, drainage, re-opened)    - Feeling sick to your stomach and throwing up    - Confusion    - Mouth sores/pain in your mouth or throat     - Vision changes   - Hard stool or very loose stools (increase in ostomy   - Fever >100.4 F, chills     Output)        - Worsening cough with mucus that is green, yellow or bloody   - No urine for 12 hours      - Pain or burning when going to the bathroom    - Feeding tube or other catheter/tube issue     - Home infusion pump issue - call [phone]   - Redness or pain at previous IV or port/catheter site    - Depressed or anxiety     GREEN ZONE: You are in control   Your symptoms are under control. Continue to take your medicine as ordered. Keep all visits to the doctor.   - No increase or worsening symptoms   - Able to take your medicine   - Able to drink and eat     For your safety and best care, please DO NOT use MyChart messages to report red or yellow symptoms.   MyChart messages are only checked during weekday normal business hours and you should receive a   Response within 2 business days.   Please use MyChart only for the follows:   - Non-urgent medication refills, scheduling requests or general questions.           Patient Education:     - Wash your hands routinely with soap and water  - Take your temperature when you have chills or are not feeling well  - Use a soft toothbrush  - Avoid constipation or straining with bowel movements. This may mean you occasionally need to take over-the-counter stool softeners or laxatives.   - Avoid people who have colds or the flu, or are not feeling well.  - Wear a mask when visiting crowded places.  - Maintain a well-balanced diet and eat healthy foods  - Speak with your doctor before having any dental work done  - Do only as much activity as you can tolerate    Other instructions:  - Don't use dental floss if your platelet count is below 50,000. Your doctor or nurse should tell you if this is the case.  - Use any mouthwashes given to you as directed.  - If you can't tolerate regular brushing, use an oral swab (bristle-less) toothbrush, or use salt and baking soda to clean your mouth. Mix 1 teaspoon of salt and 1  teaspoon of baking soda into an 8-ounce glass of warm water. Swish and spit.  - Watch your mouth and tongue for white patches. This is a sign of fungal infection, a common side effect of chemotherapy. Be sure to tell your doctor about these patches. Medication/mouthwashes can be prescribed to help you fight the fungal infection.      COVID-19 is a new challenge, but [facility] and the [facility] is dedicated to providing you and your loved ones with the best possible cancer care and support in the safest way possible during this time. We made two videos about the ways we are working to keep you safe, such as offering the option to visit your care team over the phone or through a video, as well as support services offered for our patients and their caregivers. If you have any questions about your cancer care, please call your care team.  ??  Video #1: Keeping [facility] Cancer Care patients safe during the COVID-19 crisis  http://go.eabjmlille.com  ??  Video #2: Support for cancer patients and their caregivers during the COVID-19 pandemic  http://go.SecureGap.uy  ??  Video #2: Support for cancer patients and their caregivers during the COVID-19 pandemic  http://go.SecureGap.uy          Follow Up instructions and Outpatient Referrals     Ambulatory referral to Cardiology      Call MD for:  difficulty breathing, headache or visual disturbances      Call MD for:  persistent nausea or vomiting      Call MD for:  severe uncontrolled pain      Call MD for:  temperature >38.5 Celsius      Discharge instructions          Appointments which have been scheduled for you    Nov 14, 2021  7:30 AM  (Arrive by 7:00 AM)  NURSE LAB DRAW with ADULT ONC LAB  [facility] ADULT ONCOLOGY LAB DRAW STATION [facility] REGION) [address]  [place] [address]  [phone]      Nov 14, 2021  8:30 AM  (Arrive by 8:00 AM)  RETURN ACTIVE [place] with Rod Mae, FNP  [place] ONCOLOGY MULTIDISCIPLINARY 2ND FLR CANCER HOSP [facility] REGION) [address]  [place] [address]  [phone]      Nov 14, 2021  9:30 AM  (Arrive by 9:00 AM)  LEVEL 210 with [employer] CHAIR 05   ONCOLOGY INFUSION [place] [facility] REGION) [address]  [place] [address]  [phone]      Nov 14, 2021 10:20 AM  (Arrive by 9:50 AM)  RETURN NUTRITION with Howie Ill, RD/LDN  [facility] NUTRITION SERVICES CANCER HOSP [place] [facility] REGION) [address]  [place] [address]  [phone]      Dec 05, 2021  7:30 AM  (Arrive by 7:00 AM)  NURSE LAB DRAW with ADULT ONC LAB  [facility] ADULT ONCOLOGY LAB DRAW STATION  [facility] REGION) [address]  [place] [address]  [phone]      Dec 05, 2021  8:30 AM  (Arrive by 8:00 AM)  RETURN ACTIVE [place] with Phil Dopp, MD  [facility] ONCOLOGY MULTIDISCIPLINARY 2ND FLR CANCER HOSP [facility] REGION) [address]  [place] [address]  [phone]      Dec 05, 2021  9:30 AM  (Arrive by 9:00 AM)  LEVEL  210 with [employer] CHAIR 06  [place] ONCOLOGY INFUSION [place] [facility] REGION) [address]  [place] [address]  [phone]           ______________________________________________________________________  Discharge Day Services:  BP 138/93  - Pulse 97  - Temp 36.6 ??C (97.9 ??F) (Oral)  - Resp 16  - Ht 167.6 cm (5' 6)  - Wt (!) 125.7 kg (277 lb 1.9 oz)  - SpO2 95%  - BMI 44.73 kg/m??   Pt seen on the day of discharge and determined appropriate for discharge.    Condition at Discharge: stable    Length of Discharge: I spent greater than 30 mins in the discharge of this patient.

## 2021-11-01 NOTE — Unmapped (Signed)
Pt is discharged. Went through the AVS and no question remain. Removed PIV. Husband at bedside and left with pt. Pt prefer walking out of hospital and no need for transportation.

## 2021-11-03 DIAGNOSIS — C187 Malignant neoplasm of sigmoid colon: Principal | ICD-10-CM

## 2021-11-03 NOTE — Unmapped (Addendum)
Called and spoke to patient to follow up on hospital visit and plan going forward. Per Dr. Rozell Searing, patient needs to have nuclear stress test and follow up with cardiology prior to coming for 12/23 visit with APP.     Patient is scheduled for PET stress test on 12/20. She is going to schedule cardiology follow up on 12/22.     We discussed [facility] ctDNA. She said that she discussed this with Dr. Rozell Searing while she was in the hospital. Explained that first blood draw would tentatively be on 12/9. Ordered ctDNA testing online, reached out to Gabriel Rung to make sure no additional information is needed.     We discussed the tentative schedule for next week:  12/19: [facility] mobile phlebotomy at home  12/20: PET stress test  12/22: cardiology follow up (patient scheduling)  12/23: APP visit with [facility].     Patient verbalized understanding of all and expressed appreciation for the call.     Faxed Recent Clinic Notes to:  [facility]  Cell: [phone]   Fax: [phone]    Reached out to Intake to obtain Surg Path report from [facility]     Faxed Surgical pathology report to:  [facility]  Cell: [phone]   Fax: [phone]

## 2021-11-07 NOTE — Unmapped (Unsigned)
[facility] GI MEDICAL ONCOLOGY CLINIC NOTE    Encounter Date: 11/14/2021    PCP:   Terrall Laity, PA    Consulting Providers:  Dr. Alden Server [facility] Surgery   ____________________________________________________________________  Oncology History:   Diagnosis: sigmoid colon adenocarcinoma  Current Goal of Therapy: adjuvant, curative  - 08/2021 patient underwent EGD and full colonoscopy in setting of generalized abdominal pain and change in bowel habits revealing a mass in sigmoid colon with near total obstruction that was biopsied as moderately differentiated adenocarcinoma. Also had a descending colon tubulovillous adenoma and a tubular adenoma in transverse colon. Stomach biopsy revealed chronic gastritis (no H. Pylori). CEA 1.   - 09/05/21 open sigmoid colectomy with primary anastomosis with pathology revealing moderately differentiated adenocarcinoma invading through MP into pericolonic tissues, negative margins, no PNI/LVI, no tumor deposits or perforation, 3/17 LNs positive for cancer, pMMR. Staged as pT3N1b  - 11/15-11/21/22 admitted with port infection (MSSA bacteremia) treated with nafcillin  - 10/24/21-current CAPOX x 4 cycles     Molecular: pMMR  ____________________________________________________________________  Assessment and Plan:  Nina Mitchell  is a 50 y.o. female who presented for evaluation and recommendations regarding sigmoid colon adenocarcinoma.     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 For her low-risk stage III colon cancer, we discussed adjuvant therapy with CAPOX x 3 months. Labs today all adequate to start treatment.   - C1 CAPOX 10/24/21  - PICC removed after C1; can consider re-insertion of PICC with later cycles if she has oxaliplatin induced vein pain    - Surveillance:              - H&P + CEA: every 3-6 months x 2 years, then every 6 months x 3 years              - Scans: annually, repeat CT chest after adjuvant therapy, full body scans due 06/2022              - Colonoscopy: due 08/2022    Coronary Vasospasm: Prominent after cycle 1. Was admitted for chest pain secondary to this. Work-up was relatively negative. Subsequently underwent stress test prior to cycle 2 w/ cardiology follow up prior to c2.   ??  Recent MSSA Bacteremia - Port Infection: now s/p several weeks of nafcillin. Finished treatment on 10/23/21. Port has been removed  - s/p PICC line removal after cycle 1    - Regarding vasospasm and capecitabine, there is no good answer-- could well be fine with the nitrate and CCB combo. The nice thing about cape is each dose is pretty low dose so low risk. She is agreeable to bring her pills and take them before her infusion so she is physically in the infusion center during her cape dose kicking in just incase had chest pain again, and could get an EKG during pain if it happens    Pulmonary Nodules: multiple pulmonary nodules noted on 09/29/21 scans that were not definitively seen on previous scans (though reports note some nodules in past). We will keep a close eye on these and repeat imaging after finishing adjuvant therapy.  - CT chest after finishing three months of CAPOX    Multiple Sclerosis: managed by local neurologist  ??  T2DM: managed by local PCP. Recently started Ozempic. Hyperglycemic today.   - cont Ozempic  - will give 8U  NPH today for hyperglycemia and to hopefully improve post dex hyperglycemia  - managed by PCP    HTN:   - lisinopril and atenolol continued     Supportive Care:  CINV: PRN Zofran and Clonopin for second line     Anxiety and Depression:  - escitalopram 20mg  daily  - Klonopin 0.5mg  PRN    Follow-Up:   RTC in 3 weeks for labs + Sorah + C3 CAPOX     _____________________________________________________________________  Interval History:Nina Mitchell  is a 50 y.o. female who presented for a return visit regarding colon adenocarcinoma. She presents for C2 Capox. After C1 she was subsequently admitted for chest pain from 12/7 to 12/8 with no changes on EKG and unremarkable hsTrop of 43 and 30 on repeat most c/w capecitabine induced coronary vasospasm as this is a known cardiotoxic effect. She underwent stress test 12/20 --> w/ follow up with cardiology 12/22 and started on CCB and Nitrite.        With exception of chest pain, she voiced she overall tolerated treatment relatively well.     Since last visit, she presented to [facility] on 10/07/2021 with a port infection and fevers.  Admitted and was found to have MSSA bacteremia, and her antibiotics were narrowed to nafcillin.  Her port was removed and she had a PICC placed prior to discharge.  Discharged on 10/13/2021 to outpatient follow-up.    Today, notes that she is overall doing well and is slowly recovering from her eventful couple of weeks.  States no pain at the port site and the PICC is not a particularly annoying to her.  She feels ready for treatment, however, also not ready. Feeling a bit anxious about the lung nodules, but ok with the plan to watch them closely and repeat scans in a couple of months.     Denies fever, chills, weight changes, mouth sores, chest pain, SOB, cough, abd pain, N/V/D, constipation, swelling, rashes, bruises.     Past Medical History:  HTN  T2DM  HLD  GERD  Hypothyroidism  Mood disorder   Multiples sclerosis  Migraine    Social History:  Never smoker  Rarely consumes alcohol (1-2x per year, mixed drink)    Review of Systems:  A ten-point review of systems was conducted and negative unless stated in HPI.    Physical Exam:  VITALS: There were no vitals taken for this visit.  CONSTITUTIONAL: in NAD, appears stated age, able to get on exam table unassisted  HEENT: MMM, no oral lesions or exudates, neck supple  CARDIO: normal rate, regular rhythm, no murmurs, port site looks clean and crusted over after removal, PICC in RUE  RESPIRATORY: CTAB b/l, normal WOB on RA, no conversational dyspnea  GI: soft, NT, ND, no appreciable HSM  EXTREMITIES: no LE edema  SKIN: no rashes or eccymoses  NEUROLOGIC: alert and oriented, no gross focal deficits noted   LYMPH: no supraclavicular, axillary, or cervical LAD  PSYCH: appropriate mood and affect, good insight and judgment     Labs:  Reviewed In Epic, __ for treatment  WBC    ANC   Hemoglobin    MCV  Platelets   Sodium   Creatinine   Glucose   CMP     Imaging:  I personally reviewed the following imaging studies and my interpretation is: No new imaging to review for this encounter.     CT Chest 09/29/21:  - multiple subcentimeter pulmonary nodules that were not obviously seen on previous scans (  though some were present)  - multiple sclerotic bone lesions that were present on previous scans

## 2021-11-11 ENCOUNTER — Ambulatory Visit: Admit: 2021-11-11 | Discharge: 2021-11-12 | Payer: PRIVATE HEALTH INSURANCE

## 2021-11-11 MED ADMIN — regadenoson (LEXISCAN) injection: .4 mg | INTRAVENOUS | @ 14:00:00 | Stop: 2021-11-11

## 2021-11-11 MED ADMIN — Rb-82 Rubidium Chloride: 40 | INTRAVENOUS | @ 14:00:00 | Stop: 2021-11-11

## 2021-11-11 MED ADMIN — Rb-82 Rubidium Chloride: 40 | INTRAVENOUS | @ 13:00:00 | Stop: 2021-11-11

## 2021-11-11 NOTE — Unmapped (Signed)
Called and spoke to patient to follow up on MyChart message received. Patient is scheduled to see APP - Ophelia Charter, NP on Friday, 12/23. She is also still scheduled for Infusion. Patient was recently hospitalized and has follow up with cardiology this week in addition to nuclear stress test that was done today. Patient is not mentally prepared to have a chemotherapy treatment on Friday, feels that she needs a break, and wants to have plan in place before continuing. Advised patient that I would reach out to team about canceling appointments and follow up with her. She verbalized understanding and expressed appreciation for the call.

## 2021-11-11 NOTE — Unmapped (Signed)
Appointment canceled as requested, do I need to call her?    Thanks,  [employer]

## 2021-11-12 NOTE — Unmapped (Signed)
Faxed signed Discharge Summary (Infusion Therapy Nursing) to:    Rosie Fate  [facility]  Phone: [phone]  Fax: [phone]    Verified receipt.

## 2021-11-13 ENCOUNTER — Ambulatory Visit: Admit: 2021-11-13 | Discharge: 2021-11-14 | Payer: PRIVATE HEALTH INSURANCE

## 2021-11-13 DIAGNOSIS — I201 Angina pectoris with documented spasm: Principal | ICD-10-CM

## 2021-11-13 DIAGNOSIS — R079 Chest pain, unspecified: Principal | ICD-10-CM

## 2021-11-13 MED ORDER — NITROGLYCERIN 0.4 MG SUBLINGUAL TABLET
ORAL_TABLET | SUBLINGUAL | 0 refills | 1 days | Status: CP | PRN
Start: 2021-11-13 — End: 2022-11-13

## 2021-11-13 MED ORDER — METOPROLOL SUCCINATE ER 50 MG TABLET,EXTENDED RELEASE 24 HR
ORAL_TABLET | Freq: Every day | ORAL | 6 refills | 30 days | Status: CP
Start: 2021-11-13 — End: 2021-11-13

## 2021-11-13 MED ORDER — DILTIAZEM CD 240 MG CAPSULE,EXTENDED RELEASE 24 HR
ORAL_CAPSULE | Freq: Every day | ORAL | 6 refills | 30 days | Status: CP
Start: 2021-11-13 — End: 2021-12-13

## 2021-11-13 NOTE — Unmapped (Signed)
Impression/Recommendations     Nina Mitchell is a 50 y.o. year old female with a past medical history significant for colon cancer status post open sigmoid colectomy and recent right IJ port placement, FH of early CAD, DM, HTN, HLD, and obesity who was referred for evaluation of chest pain.    1. Chest pain, unspecified type  Recently seen at [facility] for multiple episodes of spasming chest and left arm pain 6 days after initiation of capecitabine for her colon cancer.  There has been an association with coronary vasospasm and capecitabine, and temporally would be the likely cause of her symptoms  -We will transition from Toprol 25 mg daily to diltiazem to 40 mg daily for suspected vasospasm, and I have prescribed sublingual nitroglycerin to take as needed for chest discomfort, as she plans to potentially reinitiate capecitabine therapy next year  - Ambulatory referral to Cardiology    CAD risk modification:  HTN: BP is elevated in the office today, management as above    Cholesterol: Lipid profile has been checked 05/2021: TC 139, TG 86, HDL 44, LDL 78, Currently on rosuvastatin 5mg  daily    Smoking cessation: Never smoker    Exercise: strongly recommended to continue exercise.    Heart Healthy diet: Strongly recommended to eat heart healthy diet.    Return in about 3 weeks (around 12/04/2021).    Patient has been instructed to call the office with any further questions or concerns, or if they develop new or worsening symptoms.      Risa Grill, DO  Interventional Cardiology  Clinical Assistant Professor - Division of Cardiology  [facility]  Office Phone: [phone]  Pager: [phone]    I have spent >60 minutes in consultation with the patient, of that time, I have spent more than 50% discussing her diagnoses, treatment options and coordination of her care.    Subjective:     Chief Complaint:  Chief Complaint   Patient presents with   ??? Cardiomyopathy       History of Present Illness:     Nina Mitchell is a 50 y.o. female with a cardiovascular history as noted below, who is referred by Terrall Laity, PA for evaluation of chest pain.      Patient was recently seen at [facility] on 10/30/2021 with complaints of chest pain in the setting of recent initiation of capecitabine.  Cardiology was consulted, and it was felt that her intermittent chest pain was likely caused by coronary vasospasm in the setting of capecitabine initiation (started 6 days before her episodes of chest pain).  While inpatient, she was not amenable to a repeat cardiac catheterization or a trial of capecitabine while on telemetry, and a PET MPI was ordered in the outpatient setting, which was performed 11/11/2021 and was normal.  She was started on Toprol 25 mg daily, and scheduled for outpatient follow-up.    Since she left the hospital, she has not had any recurrent episodes of chest pain since this time. She reports that she did followup with her oncologist, who had noted a few other patients who had chest pain since this occurred. She has not been on the capecitabine since this time. She reports that she has had chest pressure the past, and has had multiple stress tests and a cath in 2019, which showed 20% proximal LAD. She reports that she was feeling a spasming pain in the chest with left arm pain, which would  last for ~5 minutes at a time, although did have an episode lasting 20 minutes. She reports that NTG resolved her pain while in the ED within 2-3 minutes.      Patient denies exertional chest pain, dyspnea, orthopnea, paroxysmal nocturnal dyspnea, lower extremity edema, frequent or prolonged palpitations, dizziness, lightheadedness, pre-syncope or syncope.    She have a known history of diabetes, hypertension, hyperlipidemia, and mild, nonobstructive CAD on a LHC 2019, CHF, stroke/TIA, PAD, or CKD.  She is a never smoker. Her father died of an MI at 53.     No additional complaints or concerns.    Cardiovascular History and Procedures   Cardiovascular Problems:  ?? Colon cancer status post open sigmoid colectomy  ?? Diabetes  ?? Hypertension  ?? Hyperlipidemia  ?? Family history of premature cardiovascular disease  ?? Obesity    Catheterizations, Interventions:  LHC 05/24/18  1. Essentially normal coronary arteriogram. (20% proximal LAD disease)  2. Normal LV systolic function, EF 65%   3. No LV regional wall motion abnormalities.      Surgeries:  ?? None    Electrophysiology Studies and Procedures:   ?? None     Imaging and Noninvasive Studies:   PET MPI 11/11/21  - Normal myocardial perfusion study  - No evidence of any significant ischemia or scar  - Left ventricular systolic function is normal. Post stress the ejection fraction is > 60%.  - No significant coronary calcifications were noted on the attenuation CT    TTE 10/31/21    1. Technically difficult study due to chest wall/lung interference.    2. The left ventricle is normal in size with mildly increased wall  thickness.    3. The left ventricular systolic function is normal, LVEF is visually  estimated at 60-65%.    4. There is grade I diastolic dysfunction (impaired relaxation).    5. The left atrium is mildly dilated in size.    6. The right ventricle is normal in size, with normal systolic function.      Medical History:  Past Medical History:   Diagnosis Date   ??? Adenocarcinoma of sigmoid colon (CMS-HCC) 09/18/2021   ??? Anxiety    ??? Arthritis Jaw and other areas    2009   ??? Depression    ??? Disease of thyroid gland    ??? GERD (gastroesophageal reflux disease)    ??? HTN (hypertension)    ??? Mild intermittent asthma without complication    ??? Obesity        Surgical History:  Past Surgical History:   Procedure Laterality Date   ??? IR INSERT PORT AGE GREATER THAN 5 YRS  09/29/2021    IR INSERT PORT AGE GREATER THAN 5 YRS 09/29/2021 Trude Mcburney, MD IMG VIR [facility]   ??? PR HYSTEROSCOPY,W/ENDOMETRIAL ABLATION N/A 04/17/2015    Procedure: HYSTEROSCOPY SURGICAL; W/ENDOMETRIAL ABLATION (EG, ENDOMETRIAL RESECT, ELECTROSURGICAL ABLATION, THERMOABL);  Surgeon: Orma Render, MD;  Location: OR [facility];  Service: Gynecology   ??? SINUS SURGERY     ??? TEMPOROMANDIBULAR JOINT SURGERY     ??? TYMPANOSTOMY TUBE PLACEMENT         Social History:   reports that she has never smoked. She has never used smokeless tobacco. She reports current alcohol use. She reports that she does not use drugs.    Family History:  family history includes Breast cancer (age of onset: 38) in her mother; COPD in her maternal grandmother; Cancer in her mother; Heart disease in  her father, maternal grandmother, and mother; Thyroid disease in her mother.    Review of Systems:   Except as noted in the HPI, the remainder of 10 systems reviewed is negative.     Allergies:  Allergies   Allergen Reactions   ??? Bupropion Anxiety and Other (See Comments)     ANXIETY  ANXIETY  ANXIETY  ANXIETY  ANXIETY     ??? Prochlorperazine Maleate Rash     Other Reaction: jittery;tachycardia   ??? Sulfa (Sulfonamide Antibiotics) Hives   ??? Sulfamethoxazole-Trimethoprim Nausea Only and Nausea And Vomiting   ??? Wellbutrin [Bupropion Hcl] Other (See Comments)     ANXIETY   ??? Benzonatate Rash   ??? Cefaclor Rash     Other Reaction: neck swelling, but tolerate rocephin IM fine per pt.       Medications:   Prior to Admission medications    Medication Sig Start Date End Date Taking? Authorizing Provider   capecitabine (XELODA) 150 MG tablet Take 2 tablets (300 mg total) by mouth Two (2) times a day  with 1 other capecitabine prescription for 2,300 mg total. Take Days 1 through 14, then 1 week off. 10/09/21  Yes Phil Dopp, MD   capecitabine (XELODA) 500 MG tablet Take 4 tablets (2,000 mg total) by mouth Two (2) times a day  with 1 other capecitabine prescription for 2,300 mg total. Take Days 1 through 14, then 1 week off. 10/09/21  Yes Phil Dopp, MD   clonazePAM (KLONOPIN) 0.5 MG tablet 0.5 mg daily as needed (for anxiety or nausea). 09/17/21  Yes Historical Provider, MD   escitalopram oxalate (LEXAPRO) 20 MG tablet Take 20 mg by mouth at bedtime. 05/23/21  Yes Historical Provider, MD   gabapentin (NEURONTIN) 300 MG capsule Take 300 mg by mouth nightly. 09/16/21  Yes Historical Provider, MD   levothyroxine (SYNTHROID) 25 MCG tablet Take 25 mcg by mouth daily.   Yes Historical Provider, MD   lisinopriL (PRINIVIL,ZESTRIL) 2.5 MG tablet Take 2.5 mg by mouth at bedtime. 09/17/21  Yes Historical Provider, MD   loperamide (IMODIUM) 2 mg capsule If having diarrhea, take 2 capsules (4 mg) after the first loose stool, then 1 capsule every 2 hours until diarrhea free for 12 hours. 10/09/21  Yes Phil Dopp, MD   metoprolol succinate (TOPROL XL) 25 MG 24 hr tablet Take 1 tablet (25 mg total) by mouth daily. 10/31/21 11/30/21 Yes Idell Pickles, MD   multivitamin with minerals tablet Take 1 tablet by mouth daily.  10/04/08  Yes Historical Provider, MD   onabotulinumtoxinA (BOTOX) 200 unit SolR injection Inject 200 Units into the skin Every three (3) months. 09/30/09  Yes Historical Provider, MD   ondansetron (ZOFRAN-ODT) 8 MG disintegrating tablet Take 1 tablet by mouth twice daily on days 2 and 3 of chemotherapy cycles. May take up to 1 tablet every 8 hours as needed (but do not take on day 1 of chemotherapy cycles). 10/09/21  Yes Phil Dopp, MD   oxyCODONE-acetaminophen (PERCOCET) 10-325 mg per tablet Take 1 tablet by mouth every six (6) hours as needed.  12/25/16  Yes Historical Provider, MD   rizatriptan (MAXALT-MLT) 10 MG disintegrating tablet Take 1 tablet (10 mg total) by mouth daily as needed for migraine. Avoid taking if you have episodes of chest pain. This medication could worsen vasospasms. 10/31/21 11/30/21 Yes Idell Pickles, MD   rosuvastatin (CRESTOR) 5 MG tablet Take 5 mg by mouth every evening. 09/17/21  Yes  Historical Provider, MD   semaglutide (OZEMPIC) 0.25 mg or 0.5 mg(2 mg/1.5 mL) PnIj injection Ozempic 0.25 mg or 0.5 mg (2 mg/1.5 mL) subcutaneous pen injector   INJECT 0.25MG  WEEKLY EVERY 4 WEEKS AND THEN INCREASE TO 0.5MG  WEEKLY   Yes Historical Provider, MD   tiZANidine (ZANAFLEX) 4 MG tablet Take 4 mg by mouth every eight (8) hours as needed.  09/30/09  Yes Historical Provider, MD   famotidine (PEPCID) 40 MG tablet Take 1 tablet (40 mg total) by mouth Two (2) times a day. 10/13/21 11/12/21  Yehuda Savannah, MD       Objective:     Physical Exam:  BP 147/98  - Pulse 97  - Temp 36.2 ??C (97.2 ??F) (Temporal)  - Wt (!) 126.1 kg (278 lb)  - SpO2 99%  - BMI 44.87 kg/m??   GENERAL: appears stated age, no distress   HEENT: EOMI, MMM, no oropharyngeal lesions. Neck supple without masses. No JVD noted.   RESP: CTAB. No wheezes, rhonchi, or rales.   CARDIO: RRR, normal S1, S2, no murmurs, rubs, or gallops.   ABD: Normoactive BS. Soft, NTND.   EXT: No peripheral cyanosis. No LE edema noted.    MSK: No joint swelling or erythema. No gross deformities.   SKIN: no rashes  NEURO: CN II-XII grossly intact. Strength grossly intact.   PSYCH: Alert and oriented x3. Appropriate mood.     Pertinent Laboratory Studies:   Lab Results   Component Value Date    LDL calculated 114 (H) 09/11/2015    LDL Calculated 87 05/20/2017    Non-HDL Cholesterol 100 05/20/2017    HDL 30 (L) 05/20/2017    HDL 35 (L) 09/11/2015    INR 1.18 10/09/2021    Potassium 3.6 10/31/2021    Potassium, Bld 3.4 (L) 04/01/2017    Magnesium 1.6 10/31/2021    BUN 10 10/31/2021    BUN 10 09/11/2015    Creatinine Whole Blood, POC 0.4 (L) 09/29/2021    Creatinine 0.50 (L) 10/31/2021       Pertinent Test Results:  ECG: Normal sinus rhythm, poor R wave progression, nonspecific ST/T wave abnormality on 10/31/2021

## 2021-11-14 ENCOUNTER — Ambulatory Visit
Admit: 2021-11-14 | Discharge: 2021-11-15 | Payer: PRIVATE HEALTH INSURANCE | Attending: Registered" | Primary: Registered"

## 2021-11-14 ENCOUNTER — Ambulatory Visit: Admit: 2021-11-14 | Discharge: 2021-11-15 | Payer: PRIVATE HEALTH INSURANCE

## 2021-11-14 DIAGNOSIS — C189 Malignant neoplasm of colon, unspecified: Principal | ICD-10-CM

## 2021-11-14 DIAGNOSIS — C187 Malignant neoplasm of sigmoid colon: Principal | ICD-10-CM

## 2021-11-20 MED FILL — CAPECITABINE 500 MG TABLET: ORAL | 21 days supply | Qty: 112 | Fill #1

## 2021-12-02 NOTE — Unmapped (Signed)
[facility] GI MEDICAL ONCOLOGY CLINIC NOTE    Encounter Date: 12/05/2021    PCP:   Terrall Laity, PA    Consulting Providers:  Dr. Alden Server [facility]   ____________________________________________________________________  Oncology History:   Diagnosis: sigmoid colon adenocarcinoma  Current Goal of Therapy: adjuvant, curative  - 08/2021 patient underwent EGD and full colonoscopy in setting of generalized abdominal pain and change in bowel habits revealing a mass in sigmoid colon with near total obstruction that was biopsied as moderately differentiated adenocarcinoma. Also had a descending colon tubulovillous adenoma and a tubular adenoma in transverse colon. Stomach biopsy revealed chronic gastritis (no H. Pylori). CEA 1.   - 09/05/21 open sigmoid colectomy with primary anastomosis with pathology revealing moderately differentiated adenocarcinoma invading through MP into pericolonic tissues, negative margins, no PNI/LVI, no tumor deposits or perforation, 3/17 LNs positive for cancer, pMMR. Staged as pT3N1b  - 11/15-11/21/22 admitted with port infection (MSSA bacteremia) treated with nafcillin  - 10/24/21-current CAPOX x 4 cycles    - C1 complicated by admission for presumed coronary vasospasm occurring on 10/30/21. Did not take more capecitabine after that point   - 12/05/21 C2     Molecular: pMMR  ____________________________________________________________________  Assessment and Plan:  Nina Mitchell  is a 51 y.o. female who presented for evaluation and recommendations regarding sigmoid colon adenocarcinoma.     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 For her low-risk stage III colon cancer, we discussed adjuvant therapy with CAPOX x 3 months. C1 was complicated by coronary vasospasm (presumed 2/2 5FU).     Since that time, she has had a full cardiac work-up which was negative for ischemia. Discussed again today that given she is on long-acting CCB and PRN nitrate, reasonable to retrial capecitabine today. We will have her take cape today while here so we can monitor her and complete her 14 days as normal. She understands the risks, wants to move forward with therapy, and knows to reach out if any chest pain, etc, take nitrate. In three weeks, hopefully her ctDNA testing will be back and can use this to guide utility of completing her four cycles. Lack of data on what to do, so if she tolerates C2, I'd probably advocate for her to finish all four cycles until we have prospective data to guide that decision. I'll see her back in three weeks to check in.   - start C2 CAPOX today with plan to get first cape in infusion so we can watch her  - RTC in 3 weeks for consideration of C3 and hopefully we'll have ctDNA testing back by then   - Signatera drawn 11/10/2021; result pending    - Surveillance:              - H&P + CEA: every 3-6 months x 2 years, then every 6 months x 3 years              - Scans: annually, repeat CT chest after adjuvant therapy, full body scans due 06/2022              - Colonoscopy: due 08/2022    Coronary Vasospasm: Occurred about 8 days after C1D1 CAPOX. Was admitted for chest pain secondary to this. Work-up was relatively negative including NM stress test and ECG. Per cardiology, most likely etiology is 2/2 5FU. She has since  been started on CCB and PRN Nitrate and no recurrence of her chest pain.   - continue CCB and PRN nitrate ---> if recurrence could consider a long-acting nitrate   - Has cardiology follow up 12/10/20     Recent MSSA Bacteremia - Port Infection: now s/p several weeks of nafcillin. Finished treatment on 10/23/21. Port has been removed  - s/p PICC line removal after cycle 1      Pulmonary Nodules: multiple pulmonary nodules noted on 09/29/21 scans that were not definitively seen on previous scans (though reports note some nodules in past). We will keep a close eye on these and repeat imaging after finishing adjuvant therapy.  - CT chest after finishing three months of CAPOX    Multiple Sclerosis: managed by local neurologist  ??  T2DM: managed by local PCP. Recently started Ozempic  - cont Ozempic  - managed by PCP    HTN:   - lisinopril and dilt continued     Supportive Care:  CINV: PRN Zofran and Clonopin for second line     Anxiety and Depression:  - escitalopram 20mg  daily  - Klonopin 0.5mg  PRN    Follow-Up:   RTC in 3 weeks for labs, Sanvi Ehler, and C3 CAPOX  RTC in 6 weeks for labs, Aitanna Haubner, and C4 CAPOX (scheduled)    I personally spent 40 minutes face-to-face and non-face-to-face in the care of this patient, which includes all pre, intra, and post visit time on the date of service.  All documented time was specific to the E/M visit and does not include any procedures that may have been performed.  _____________________________________________________________________  Interval History:[name]  is a 51 y.o. female who presented for a return visit regarding colon adenocarcinoma.     [name] is overall doing well. Has not had any recurrence of chest pain since her last episode. Has not needed any PRN nitrate. She is otherwise doing well and has no complaints today.    Discussed that her goal is to remain aggressive in treating her cancer as she has a lot of life to live though doesn't want to put herself at risk. She wants to do C2 today and we'll readdress along the way finishing her therapy vs stopping.     Past Medical History:  HTN  T2DM  HLD  GERD  Hypothyroidism  Mood disorder   Multiples sclerosis  Migraine    Social History:  Never smoker  Rarely consumes alcohol (1-2x per year, mixed drink)    Review of Systems:  A ten-point review of systems was conducted and negative unless stated in HPI.    Physical Exam:  VITALS: BP 158/91  - Pulse 88  - Temp 36.7 ??C (98 ??F) (Temporal)  - Resp 18  - Ht 167.6 cm (5' 6)  - Wt (!) 126.6 kg (279 lb 1.6 oz)  - SpO2 99%  - BMI 45.05 kg/m??   CONSTITUTIONAL: in NAD, appears stated age  HEENT: mask in place   EXTREMITIES: no LE edema  SKIN: no rashes or eccymoses  NEUROLOGIC: alert and oriented, no gross focal deficits noted   LYMPH: no supraclavicular, axillary, or cervical LAD  PSYCH: appropriate mood and affect, good insight and judgment     Labs:  Reviewed In Epic  WBC 6.7  Hemoglobin 12.8 MCV 82  Platelets 410  Creatinine 0.41 (stable)  All labs adequate for treatment    Imaging:  I personally reviewed the following imaging studies and my interpretation is:  No new imaging to review

## 2021-12-05 ENCOUNTER — Ambulatory Visit: Admit: 2021-12-05 | Discharge: 2021-12-05 | Payer: PRIVATE HEALTH INSURANCE

## 2021-12-05 ENCOUNTER — Ambulatory Visit
Admit: 2021-12-05 | Discharge: 2021-12-05 | Payer: PRIVATE HEALTH INSURANCE | Attending: Registered" | Primary: Registered"

## 2021-12-05 ENCOUNTER — Other Ambulatory Visit: Admit: 2021-12-05 | Discharge: 2021-12-05 | Payer: PRIVATE HEALTH INSURANCE

## 2021-12-05 DIAGNOSIS — I201 Angina pectoris with documented spasm: Principal | ICD-10-CM

## 2021-12-05 DIAGNOSIS — Z79899 Other long term (current) drug therapy: Principal | ICD-10-CM

## 2021-12-05 DIAGNOSIS — C187 Malignant neoplasm of sigmoid colon: Principal | ICD-10-CM

## 2021-12-05 DIAGNOSIS — C189 Malignant neoplasm of colon, unspecified: Principal | ICD-10-CM

## 2021-12-05 DIAGNOSIS — I10 Essential (primary) hypertension: Principal | ICD-10-CM

## 2021-12-05 LAB — POTASSIUM: POTASSIUM: 4.2 mmol/L (ref 3.4–4.8)

## 2021-12-05 LAB — CBC W/ AUTO DIFF
BASOPHILS ABSOLUTE COUNT: 0.1 10*9/L (ref 0.0–0.1)
BASOPHILS RELATIVE PERCENT: 1 %
EOSINOPHILS ABSOLUTE COUNT: 0.4 10*9/L (ref 0.0–0.5)
EOSINOPHILS RELATIVE PERCENT: 6.3 %
HEMATOCRIT: 38.8 % (ref 34.0–44.0)
HEMOGLOBIN: 12.8 g/dL (ref 11.3–14.9)
LYMPHOCYTES ABSOLUTE COUNT: 2 10*9/L (ref 1.1–3.6)
LYMPHOCYTES RELATIVE PERCENT: 30.1 %
MEAN CORPUSCULAR HEMOGLOBIN CONC: 33 g/dL (ref 32.0–36.0)
MEAN CORPUSCULAR HEMOGLOBIN: 27.3 pg (ref 25.9–32.4)
MEAN CORPUSCULAR VOLUME: 82.7 fL (ref 77.6–95.7)
MEAN PLATELET VOLUME: 7.2 fL (ref 6.8–10.7)
MONOCYTES ABSOLUTE COUNT: 0.5 10*9/L (ref 0.3–0.8)
MONOCYTES RELATIVE PERCENT: 7.3 %
NEUTROPHILS ABSOLUTE COUNT: 3.7 10*9/L (ref 1.8–7.8)
NEUTROPHILS RELATIVE PERCENT: 55.3 %
PLATELET COUNT: 420 10*9/L (ref 150–450)
RED BLOOD CELL COUNT: 4.69 10*12/L (ref 3.95–5.13)
RED CELL DISTRIBUTION WIDTH: 16.8 % — ABNORMAL HIGH (ref 12.2–15.2)
WBC ADJUSTED: 6.7 10*9/L (ref 3.6–11.2)

## 2021-12-05 LAB — CREATININE
CREATININE: 0.41 mg/dL — ABNORMAL LOW
EGFR CKD-EPI (2021) FEMALE: >90 mL/min/{1.73_m2} (ref >=60)

## 2021-12-05 LAB — MAGNESIUM: MAGNESIUM: 1.6 mg/dL (ref 1.6–2.6)

## 2021-12-05 MED ADMIN — ondansetron (ZOFRAN) tablet 24 mg: 24 mg | ORAL | @ 16:00:00 | Stop: 2021-12-05

## 2021-12-05 MED ADMIN — dextrose 5 % infusion: 100 mL/h | INTRAVENOUS | @ 16:00:00

## 2021-12-05 MED ADMIN — oxaliplatin (ELOXATIN) 314.6 mg in dextrose 5 % 250 mL IVPB: 130 mg/m2 | INTRAVENOUS | @ 17:00:00 | Stop: 2021-12-05

## 2021-12-05 MED ADMIN — dexAMETHasone (DECADRON) tablet 8 mg: 8 mg | ORAL | @ 16:00:00 | Stop: 2021-12-05

## 2021-12-05 NOTE — Unmapped (Signed)
Lab on 12/05/2021   Component Date Value Ref Range Status    Potassium 12/05/2021 4.2  3.4 - 4.8 mmol/L Final    Creatinine 12/05/2021 0.41 (L)  0.60 - 0.80 mg/dL Final    eGFR CKD-EPI (2021) Female 12/05/2021 >90  >=60 mL/min/1.65m2 Final    eGFR calculated with CKD-EPI 2021 equation in accordance with [employer] and [employer] of Nephrology Task Force recommendations.    Magnesium 12/05/2021 1.6  1.6 - 2.6 mg/dL Final    WBC 16/08/9603 6.7  3.6 - 11.2 10*9/L Final    RBC 12/05/2021 4.69  3.95 - 5.13 10*12/L Final    HGB 12/05/2021 12.8  11.3 - 14.9 g/dL Final    HCT 54/07/8118 38.8  34.0 - 44.0 % Final    MCV 12/05/2021 82.7  77.6 - 95.7 fL Final    MCH 12/05/2021 27.3  25.9 - 32.4 pg Final    MCHC 12/05/2021 33.0  32.0 - 36.0 g/dL Final    RDW 14/78/2956 16.8 (H)  12.2 - 15.2 % Final    MPV 12/05/2021 7.2  6.8 - 10.7 fL Final    Platelet 12/05/2021 420  150 - 450 10*9/L Final    Neutrophils % 12/05/2021 55.3  % Final    Lymphocytes % 12/05/2021 30.1  % Final    Monocytes % 12/05/2021 7.3  % Final    Eosinophils % 12/05/2021 6.3  % Final    Basophils % 12/05/2021 1.0  % Final    Absolute Neutrophils 12/05/2021 3.7  1.8 - 7.8 10*9/L Final    Absolute Lymphocytes 12/05/2021 2.0  1.1 - 3.6 10*9/L Final    Absolute Monocytes 12/05/2021 0.5  0.3 - 0.8 10*9/L Final    Absolute Eosinophils 12/05/2021 0.4  0.0 - 0.5 10*9/L Final    Absolute Basophils 12/05/2021 0.1  0.0 - 0.1 10*9/L Final    Anisocytosis 12/05/2021 Slight (A)  Not Present Final

## 2021-12-05 NOTE — Unmapped (Signed)
Patient arrived to chair 30.  No complaints noted.  Access of PIV intact with blood return. Pre-medicated per treatment plan orders.  Pt tolerated infusion without difficulty. PIV checked for blood return, flushed, and removed. AVS declined. Patient left infusion center, NAD.

## 2021-12-09 MED ORDER — ISOSORBIDE MONONITRATE ER 30 MG TABLET,EXTENDED RELEASE 24 HR
ORAL_TABLET | Freq: Every day | ORAL | 2 refills | 30 days | Status: CP
Start: 2021-12-09 — End: 2022-03-09

## 2021-12-09 NOTE — Unmapped (Signed)
Patient sent in message that she is having spasms again similar to after her first CAPOX treatment. Two PRN nitroglycerin have not helped. I sent in a script for Imdur 30mg  daily and recommended that she continue with PRN nitroglycerin. Our NN French Ana will call her and get more info. If she continues to have spasms even after adding the long acting nitrate (and if they sound convincing for coronary vasospasm) we'll have her stop capecitabine.

## 2021-12-09 NOTE — Unmapped (Signed)
Called and spoke to patient to follow up. She is aware of Imdur prescription that Dr. Rozell Searing has sent in for her to take. States that husband has the car today, so she is not able to pick it up until later. She is presently working from home today.     States that she had her first vasospasm after taking her morning dose of capecitabine this morning (1/17). She took 2 nitroglycerin dissolving tablets and Klonopin. She has not had a spasm since. She is aware to reach out with any additional spasms, especially if they are worse. She is aware that team plans to air on side of caution when it comes to the capecitabine. She verbalized understanding of all and expressed appreciation for the call.

## 2021-12-10 ENCOUNTER — Ambulatory Visit: Admit: 2021-12-10 | Discharge: 2021-12-11 | Payer: PRIVATE HEALTH INSURANCE

## 2021-12-10 DIAGNOSIS — I739 Peripheral vascular disease, unspecified: Principal | ICD-10-CM

## 2021-12-10 MED ORDER — DILTIAZEM ER 420 MG CAPSULE,24 HR,EXTENDED RELEASE
ORAL_CAPSULE | Freq: Every day | ORAL | 6 refills | 30 days | Status: CP
Start: 2021-12-10 — End: 2022-12-10

## 2021-12-10 NOTE — Unmapped (Signed)
Error

## 2021-12-10 NOTE — Unmapped (Signed)
Called and spoke to patient (returning call) to follow up on MyChart message. Patient reports that she started Imdur yesterday about 1 pm. Did not have any spasms with her evening dose of capecitabine. About 6 am, had spasm. Took 1 nitroglycerin. At about 6:30/7:00 am experienced spasms again, resolved with 2 nitroglycerin and Klonipin.     Patient was calling for re-assurance. She verbalizes that this is scary and frustrating. She does not want to give up but feels like this is affecting her quality of life. Advised patient that I discussed with Dr. Rozell Searing and it is okay for her to continue taking the capecitabine. Patient is scheduled for cardiology visit today. We discussed the following plan:    - Take 1 pm dose of Imdur  - Follow up with cardiology this afternoon  - Evening dose of capecitabine  - Morning dose of capecitabine  - Re-assess tomorrow    Patient is in agreement with this plan. She knows to reach out if her conversation with cardiology points her in a different direction. She expressed appreciation for the call.

## 2021-12-11 MED ORDER — MAGNESIUM OXIDE 400 MG (241.3 MG MAGNESIUM) TABLET
ORAL_TABLET | Freq: Every day | ORAL | 3 refills | 90.00000 days | Status: CP
Start: 2021-12-11 — End: 2022-12-11

## 2021-12-11 NOTE — Unmapped (Signed)
Impression/Recommendations     Nina Mitchell is a 51 y.o. 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    1. Chest pain, unspecified type  Patient was previously evaluated at [facility] 6 days after initiation of capecitabine for her colon cancer with chest pain that was suspected to be secondary to capecitabine-induced coronary vasospasm, as there has been an association with coronary vasospasm and capecitabine, and temporally would be the likely cause of her symptoms  - Given concern for the use of BB, especially nonselective BB, which may aggravate symptoms, we transitioned form toprol to diltiazem  -She was recently restarted on capecitabine for her colon cancer, and has developed recurrent symptoms c/w vasospasm, despite diltiazem 240mg  daily; her oncologist started imdur 30mg  daily yesterday  - SL NTG has been inducing migraines, although does help with her pain   - She has been very concerned regarding her recurrent chest pain, and is wondering whether continuing treatment is worth it- she will discuss this further with her oncologist  - For now we will continue to uptitrate therapy, and increase diltiazem to 420mg  nightly, continue imdur 30mg  qAM.  - If she has persistent symptoms, we will further increase her imdur and uptitrate diltiazem as her BP and HR tolerate (higher doses up to 960mg  daily may be used with refractory symptoms). Additionally, the combination of DHP and NDHP CCB may be useful for management of severe symptoms   - Additionally, statin therapy will be continued; it was noted that her magnesium level was 1.6, and she may also benefit from magnesium supplementation    CAD risk modification:  HTN: BP is elevated in the office today, management as above    Cholesterol: Lipid profile has been checked 05/2021: TC 139, TG 86, HDL 44, LDL 78, Currently on rosuvastatin 5mg  daily    Smoking cessation: Never smoker    Exercise: strongly recommended to continue exercise.    Heart Healthy diet: Strongly recommended to eat heart healthy diet.    Return in about 4 weeks (around 01/07/2022) for Video Visit.    Patient has been instructed to call the office with any further questions or concerns, or if they develop new or worsening symptoms.      Risa Grill, DO  Interventional Cardiology  Clinical Assistant Professor - Division of Cardiology  [facility]  Office Phone: [phone]  Pager: [phone]    I have spent >40 minutes in consultation with the patient, of that time, I have spent more than 50% discussing her diagnoses, treatment options and coordination of her care.    Subjective:     Chief Complaint:  Chief Complaint   Patient presents with   ??? Chest Pain     Follow Up after transitioning from Toprol 25 mg daily to diltiazem to 40 mg daily. Pain persist with use of nitroglycerin x3 today.        History of Present Illness:     [name] is a 51 y.o. female with a cardiovascular history as noted below, who presents for follow-up    Patient was last seen in our office 10/2021 after a recent evaluation at [facility] 10/30/2021 in the setting of chest pain after initiation of capecitabine.  It was felt that her intermittent chest  pain was likely coronary vasospasm.  A PET MPI was performed 11/11/2021, which was normal, and she was started on Toprol 25 mg daily with outpatient follow-up.      At the time of our last visit, she had not had any recurrent episodes of chest pain, and was transitioned from metoprolol to diltiazem to 40 mg daily.  She is followed up with her oncologist, and was restarted on capecitabine, although unfortunately developed recurrent chest discomfort, requiring nitroglycerin.  Her oncologist prescribed Imdur 30mg  daily, which was started yesterday, although she continues to have ongoing chest discomfort. She has been taking SL NTG, which induces migraine headaches. She reports significant frustration given her recurrent chest pain, despite medical therapy for vasospasm, and that she has been off her atenolol, which has been very effective in the past to treat her migraines.     Patient denies exertional chest pain, dyspnea, orthopnea, paroxysmal nocturnal dyspnea, lower extremity edema, frequent or prolonged palpitations, dizziness, lightheadedness, pre-syncope or syncope.     No additional complaints or concerns.    Cardiovascular History and Procedures   Cardiovascular Problems:  ?? Colon cancer status post open sigmoid colectomy  ?? Diabetes  ?? Hypertension  ?? Hyperlipidemia  ?? Family history of premature cardiovascular disease  ?? Obesity    Catheterizations, Interventions:  LHC 05/24/18  1. Essentially normal coronary arteriogram. (20% proximal LAD disease)  2. Normal LV systolic function, EF 65%   3. No LV regional wall motion abnormalities.      Surgeries:  ?? None    Electrophysiology Studies and Procedures:   ?? None     Imaging and Noninvasive Studies:   PET MPI 11/11/21  - Normal myocardial perfusion study  - No evidence of any significant ischemia or scar  - Left ventricular systolic function is normal. Post stress the ejection fraction is > 60%.  - No significant coronary calcifications were noted on the attenuation CT    TTE 10/31/21    1. Technically difficult study due to chest wall/lung interference.    2. The left ventricle is normal in size with mildly increased wall  thickness.    3. The left ventricular systolic function is normal, LVEF is visually  estimated at 60-65%.    4. There is grade I diastolic dysfunction (impaired relaxation).    5. The left atrium is mildly dilated in size.    6. The right ventricle is normal in size, with normal systolic function.      Medical History:  Past Medical History: Diagnosis Date   ??? Adenocarcinoma of sigmoid colon (CMS-HCC) 09/18/2021   ??? Anxiety    ??? Arthritis Jaw and other areas    2009   ??? Depression    ??? Disease of thyroid gland    ??? GERD (gastroesophageal reflux disease)    ??? HTN (hypertension)    ??? Mild intermittent asthma without complication    ??? Obesity        Surgical History:  Past Surgical History:   Procedure Laterality Date   ??? IR INSERT PORT AGE GREATER THAN 5 YRS  09/29/2021    IR INSERT PORT AGE GREATER THAN 5 YRS 09/29/2021 Trude Mcburney, MD IMG VIR [facility]   ??? PR HYSTEROSCOPY,W/ENDOMETRIAL ABLATION N/A 04/17/2015    Procedure: HYSTEROSCOPY SURGICAL; W/ENDOMETRIAL ABLATION (EG, ENDOMETRIAL RESECT, ELECTROSURGICAL ABLATION, THERMOABL);  Surgeon: Orma Render, MD;  Location: OR [facility];  Service: Gynecology   ??? SINUS SURGERY     ??? TEMPOROMANDIBULAR JOINT SURGERY     ???  TYMPANOSTOMY TUBE PLACEMENT         Social History:   reports that she has never smoked. She has never used smokeless tobacco. She reports current alcohol use. She reports that she does not use drugs.    Family History:  family history includes Breast cancer (age of onset: 58) in her mother; COPD in her maternal grandmother; Cancer in her mother; Heart disease in her father, maternal grandmother, and mother; Thyroid disease in her mother.    Review of Systems:   Except as noted in the HPI, the remainder of 10 systems reviewed is negative.     Allergies:  Allergies   Allergen Reactions   ??? Bupropion Anxiety and Other (See Comments)     ANXIETY  ANXIETY  ANXIETY  ANXIETY  ANXIETY     ??? Prochlorperazine Maleate Rash     Other Reaction: jittery;tachycardia   ??? Sulfa (Sulfonamide Antibiotics) Hives   ??? Sulfamethoxazole-Trimethoprim Nausea Only and Nausea And Vomiting   ??? Benzonatate Rash   ??? Cefaclor Rash     Other Reaction: neck swelling, but tolerate rocephin IM fine per pt.   ??? Wellbutrin [Bupropion Hcl] Anxiety     ANXIETY       Medications:   Prior to Admission medications    Medication Sig Start Date End Date Taking? Authorizing Provider   capecitabine (XELODA) 150 MG tablet Take 2 tablets (300 mg total) by mouth Two (2) times a day  with 1 other capecitabine prescription for 2,300 mg total. Take Days 1 through 14, then 1 week off. 10/09/21  Yes Phil Dopp, MD   capecitabine (XELODA) 500 MG tablet Take 4 tablets (2,000 mg total) by mouth Two (2) times a day  with 1 other capecitabine prescription for 2,300 mg total. Take Days 1 through 14, then 1 week off. 10/09/21  Yes Phil Dopp, MD   clonazePAM (KLONOPIN) 0.5 MG tablet 0.5 mg daily as needed (for anxiety or nausea). 09/17/21  Yes Historical Provider, MD   escitalopram oxalate (LEXAPRO) 20 MG tablet Take 20 mg by mouth at bedtime. 05/23/21  Yes Historical Provider, MD   gabapentin (NEURONTIN) 300 MG capsule Take 300 mg by mouth nightly. 09/16/21  Yes Historical Provider, MD   levothyroxine (SYNTHROID) 25 MCG tablet Take 25 mcg by mouth daily.   Yes Historical Provider, MD   lisinopriL (PRINIVIL,ZESTRIL) 2.5 MG tablet Take 2.5 mg by mouth at bedtime. 09/17/21  Yes Historical Provider, MD   loperamide (IMODIUM) 2 mg capsule If having diarrhea, take 2 capsules (4 mg) after the first loose stool, then 1 capsule every 2 hours until diarrhea free for 12 hours. 10/09/21  Yes Phil Dopp, MD   metoprolol succinate (TOPROL XL) 25 MG 24 hr tablet Take 1 tablet (25 mg total) by mouth daily. 10/31/21 11/30/21 Yes Idell Pickles, MD   multivitamin with minerals tablet Take 1 tablet by mouth daily.  10/04/08  Yes Historical Provider, MD   onabotulinumtoxinA (BOTOX) 200 unit SolR injection Inject 200 Units into the skin Every three (3) months. 09/30/09  Yes Historical Provider, MD   ondansetron (ZOFRAN-ODT) 8 MG disintegrating tablet Take 1 tablet by mouth twice daily on days 2 and 3 of chemotherapy cycles. May take up to 1 tablet every 8 hours as needed (but do not take on day 1 of chemotherapy cycles). 10/09/21  Yes Phil Dopp, MD oxyCODONE-acetaminophen (PERCOCET) 10-325 mg per tablet Take 1 tablet by mouth every six (6) hours as needed.  12/25/16  Yes Historical Provider, MD   rizatriptan (MAXALT-MLT) 10 MG disintegrating tablet Take 1 tablet (10 mg total) by mouth daily as needed for migraine. Avoid taking if you have episodes of chest pain. This medication could worsen vasospasms. 10/31/21 11/30/21 Yes Idell Pickles, MD   rosuvastatin (CRESTOR) 5 MG tablet Take 5 mg by mouth every evening. 09/17/21  Yes Historical Provider, MD   semaglutide (OZEMPIC) 0.25 mg or 0.5 mg(2 mg/1.5 mL) PnIj injection Ozempic 0.25 mg or 0.5 mg (2 mg/1.5 mL) subcutaneous pen injector   INJECT 0.25MG  WEEKLY EVERY 4 WEEKS AND THEN INCREASE TO 0.5MG  WEEKLY   Yes Historical Provider, MD   tiZANidine (ZANAFLEX) 4 MG tablet Take 4 mg by mouth every eight (8) hours as needed.  09/30/09  Yes Historical Provider, MD   famotidine (PEPCID) 40 MG tablet Take 1 tablet (40 mg total) by mouth Two (2) times a day. 10/13/21 11/12/21  Yehuda Savannah, MD       Objective:     Physical Exam:  BP 136/100 (BP Site: L Arm, BP Position: Sitting, BP Cuff Size: X-Large)  - Pulse 108  - Temp 36.8 ??C (98.2 ??F) (Temporal)  - Wt (!) 122.8 kg (270 lb 12.8 oz)  - BMI 43.71 kg/m??   GENERAL: appears stated age, no distress   HEENT: EOMI, MMM, no oropharyngeal lesions. Neck supple without masses. No JVD noted.   RESP: CTAB. No wheezes, rhonchi, or rales.   CARDIO: RRR, normal S1, S2, no murmurs, rubs, or gallops.   ABD: Normoactive BS. Soft, NTND.   EXT: No peripheral cyanosis. No LE edema noted.    MSK: No joint swelling or erythema. No gross deformities.   SKIN: no rashes  NEURO: CN II-XII grossly intact. Strength grossly intact.   PSYCH: Alert and oriented x3. Appropriate mood.     Pertinent Laboratory Studies:   Lab Results   Component Value Date    LDL calculated 114 (H) 09/11/2015    LDL Calculated 87 05/20/2017    Non-HDL Cholesterol 100 05/20/2017    HDL 30 (L) 05/20/2017    HDL 35 (L) 09/11/2015    INR 1.18 10/09/2021    Potassium 4.2 12/05/2021    Potassium, Bld 3.4 (L) 04/01/2017    Magnesium 1.6 12/05/2021    BUN 10 10/31/2021    BUN 10 09/11/2015    Creatinine Whole Blood, POC 0.4 (L) 09/29/2021    Creatinine 0.41 (L) 12/05/2021       Pertinent Test Results:  ECG: Not performed today

## 2021-12-12 NOTE — Unmapped (Signed)
Received call from and returned call to patient. Patient was prescribed diltiazem 420 mg ER tablets from cardiology on 1/18. When she went to [facility] she was told that they did not have this medication and no other [facility] had it [facility]. Patient has been trying to reach cardiology office but has not been able to speak with someone. She expresses that she is having a great deal of anxiety going in to the weekend without having this medication as she had 2 spasms this morning for which she needed to take nitroglycerin.    Called and spoke to [facility] at [facility] who states that they are working to fill the order now. Medication was not in stock earlier but they received shipment. Should be available to patient later today.     Returned call to patient to make her aware that medication should be available for pick up today. She expressed an interest in changing cardiology offices and having all of her care in [place]. Encouraged her to reach out to their schedulers with this request. Encouraged patient to reach out with any concerns. She verbalized understanding and expressed appreciation for the call.

## 2021-12-16 NOTE — Unmapped (Signed)
[facility] Refill Coordination Note    Specialty Medication(s) to be Shipped:   Hematology/Oncology: Capecitabine 500mg , directions: Take 4 tablets (2,000 mg total) by mouth Two (2) times a day  with 1 other capecitabine prescription for 2,300 mg total. Take Days 1 through 14, then 1 week off. and Capecitabine 150mg , directions: Take 2 tablets (300 mg total) by mouth Two (2) times a day  with 1 other capecitabine prescription for 2,300 mg total. Take Days 1 through 14, then 1 week off.    Other medication(s) to be shipped: No additional medications requested for fill at this time     Nina Mitchell, DOB: 1971-07-18  Phone: [phone] (home) [phone] (work)      All above HIPAA information was verified with patient.     Was a Nurse, learning disability used for this call? No    Completed refill call assessment today to schedule patient's medication shipment from the [facility] [phone]).  All relevant notes have been reviewed.     Specialty medication(s) and dose(s) confirmed: Regimen is correct and unchanged.   Changes to medications: Xiana reports starting the following medications: Diltiazem 420mg , Isosorbide 30mg  and Magnesium 400mg   Changes to insurance: No  New side effects reported not previously addressed with a pharmacist or physician: None reported  Questions for the pharmacist: No    Confirmed patient received a Conservation officer, historic buildings and a Surveyor, mining with first shipment. The patient will receive a drug information handout for each medication shipped and additional FDA Medication Guides as required.       DISEASE/MEDICATION-SPECIFIC INFORMATION        N/A    SPECIALTY MEDICATION ADHERENCE     Medication Adherence    Patient reported X missed doses in the last month: 0  Specialty Medication: Capecitabine 150mg   Patient is on additional specialty medications: Yes  Additional Specialty Medications: Capecitabine 500mg   Patient Reported Additional Medication X Missed Doses in the Last Month: 0  Patient is on more than two specialty medications: No  Informant: patient              Were doses missed due to medication being on hold? No    Capecitabine 500 mg: 2 days of medicine on hand.  New cycle starts  12/26/21  Capecitabine 150 mg: 2 days of medicine on hand       REFERRAL TO PHARMACIST     Referral to the pharmacist: Not needed      [facility]     Shipping address confirmed in Epic.     Delivery Scheduled: Yes, Expected medication delivery date: 12/23/21.     Medication will be delivered via UPS to the prescription address in Epic [place].    Wyatt Mage M Elisabeth Cara   [facility] Specialty Technician

## 2021-12-19 DIAGNOSIS — C187 Malignant neoplasm of sigmoid colon: Principal | ICD-10-CM

## 2021-12-22 MED FILL — CAPECITABINE 500 MG TABLET: ORAL | 21 days supply | Qty: 112 | Fill #2

## 2021-12-22 MED FILL — CAPECITABINE 150 MG TABLET: ORAL | 21 days supply | Qty: 56 | Fill #2

## 2021-12-23 NOTE — Unmapped (Signed)
[facility] GI MEDICAL ONCOLOGY CLINIC NOTE    Encounter Date: 12/26/2021    PCP:   Terrall Laity, PA    Consulting Providers:  Dr. Alden Server [facility]   ____________________________________________________________________  Oncology History:   Diagnosis: sigmoid colon adenocarcinoma  Current Goal of Therapy: adjuvant, curative  - 08/2021 patient underwent EGD and full colonoscopy in setting of generalized abdominal pain and change in bowel habits revealing a mass in sigmoid colon with near total obstruction that was biopsied as moderately differentiated adenocarcinoma. Also had a descending colon tubulovillous adenoma and a tubular adenoma in transverse colon. Stomach biopsy revealed chronic gastritis (no H. Pylori). CEA 1.   - 09/05/21 open sigmoid colectomy with primary anastomosis with pathology revealing moderately differentiated adenocarcinoma invading through MP into pericolonic tissues, negative margins, no PNI/LVI, no tumor deposits or perforation, 3/17 LNs positive for cancer, pMMR. Staged as pT3N1b  - 11/15-11/21/22 admitted with port infection (MSSA bacteremia) treated with nafcillin  - 10/24/21-current CAPOX x 4 cycles    - C1 complicated by admission for presumed coronary vasospasm occurring on 10/30/21. Did not take more capecitabine after that point   - 12/05/21 C2 no issues     Molecular: pMMR  Signatera CtDNA: negative 11/10/21  ____________________________________________________________________  Assessment and Plan:  Nina Mitchell  is a 51 y.o. female who presented for evaluation and recommendations regarding sigmoid colon adenocarcinoma.     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 For her low-risk stage III colon cancer, we discussed adjuvant therapy with CAPOX x 3 months. C1 was complicated by coronary vasospasm (presumed 2/2 5FU). Has since had a full cardiac work-up and with addition of CCB and long acting nitrate, she was able to tolerate C2 w/o major issues.     Today, discussed that her ctDNA was negative. Discussed this is great news! Though hard to say whether C1 of CAPOX affected this or not. I discussed that if her ctDNA was high, I would feel strongly about her finishing therapy, however, with it being negative we don't know what to do. Studies suggest a low risk of recurrence, but in my opinion, data isn't strong enough to suggest no adjuvant therapy. Therefore, I would recommend that she continue with her last 2 cycles of CAPOX. She is in agreement. Tolerating therapy well otherwise besides some crusting at ends of her mouth, transient neuropathy in fingers, and fatigue.   - start C3 CAPOX today  - RTC in 3 weeks for C4  - Surveillance:              - H&P + CEA: every 3-6 months x 2 years, then every 6 months x 3 years              - Scans: annually, repeat CT chest after adjuvant therapy, full body scans due 06/2022              - Colonoscopy: due 08/2022   - ctDNA: Signatera to draw every 3 months     Coronary Vasospasm: Occurred about 8 days after C1D1 CAPOX. Was admitted for chest pain secondary to this. Work-up was relatively negative including NM stress test and ECG. Per cardiology, most likely etiology is 2/2 5FU. She has since been started on CCB long acting nitrate with overall well controlled spasms.   - continue diltiazem 420mg  night and Imdur 30mg  daily (  can titrate up to 960 on dilt if needed)  - Has cardiology follow up 12/2021; she would like to transfer care to [facility] so referral to [facility] placed     Recent MSSA Bacteremia - Port Infection: now s/p several weeks of nafcillin. Finished treatment on 10/23/21. Port has been removed  - s/p PICC line removal after cycle 1      Pulmonary Nodules: multiple pulmonary nodules noted on 09/29/21 scans that were not definitively seen on previous scans (though reports note some nodules in past). We will keep a close eye on these and repeat imaging after finishing adjuvant therapy.  - CT chest in 6 weeks    Multiple Sclerosis: managed by local neurologist  ??  T2DM: managed by local PCP. Recently started Ozempic. Her BG remains fairly high and she's had some weight gain. Recommended she follow-up with PCP  - cont Ozempic  - managed by PCP    HTN:   - lisinopril and dilt continued     Supportive Care:  CINV: PRN Zofran and Clonopin for second line     Anxiety and Depression:  - escitalopram 20mg  daily  - Klonopin 0.5mg  PRN    Follow-Up:   RTC in 3 weeks for labs, Micaiah Litle, and C4 CAPOX (scheduled)  RTC in 6 weeks for labs, Lalana Wachter, and CT chest (scheduled)  _____________________________________________________________________  Interval History:Nina Mitchell  is a 51 y.o. female who presented for a return visit regarding colon adenocarcinoma.     [name] is overall doing well.  Several days into cycle 2 of CAPOX, she called in with some chest spasms.  We added Imdur 30 mg daily, which has helped.  She was previously taken as needed nitrate which caused a severe headache, however, this has not happened with Imdur.  Notes she has had some intermittent chest tightness, however, it is very intermittent and short-lived.  Otherwise, she notes some dry mouth as well as some crusting around her lips for which she is using moisturizers.  Does endorse a bit of tingling in her bilateral lateral fingers but no significant cold sensitivity.  She does have some burning in her vein where oxaliplatin was infused for a few days after treatment however, this has since gone away.  He has a little bit of diarrhea, however, no significant nausea.  Notes her energy is down, though when she is tired she works from home.  She has been able to go to work a fair bit as well.    Patient denies fevers, chills, weight changes, shortness of breath, abdominal pain, nausea, vomiting, lower extremity swelling.    Past Medical History:  HTN  T2DM  HLD  GERD  Hypothyroidism  Mood disorder   Multiples sclerosis  Migraine    Social History:  Never smoker  Rarely consumes alcohol (1-2x per year, mixed drink)    Review of Systems:  A ten-point review of systems was conducted and negative unless stated in HPI.    Physical Exam:  VITALS: BP 123/71  - Pulse 70  - Temp 37.1 ??C (98.7 ??F) (Oral)  - Resp 16  - Ht 167.6 cm (5' 5.98)  - Wt (!) 127.1 kg (280 lb 3.3 oz)  - SpO2 (!) 9%  - BMI 45.25 kg/m??   CONSTITUTIONAL: in NAD, appears stated age  HEENT: mask in place   EXTREMITIES: no LE edema  SKIN: no rashes or eccymoses  NEUROLOGIC: alert and oriented, no gross focal deficits noted   LYMPH: no supraclavicular, axillary, or  cervical LAD  PSYCH: appropriate mood and affect, good insight and judgment     Labs:  Reviewed In Epic  Sodium 136  K3.9  Creatinine 0.5 (stable)  Glucose 295  AST 71 (up from 29 at last visit)  CBC unremarkable  All labs adequate for treatment    Imaging:  I personally reviewed the following imaging studies and my interpretation is:   No new imaging to review

## 2021-12-25 NOTE — Unmapped (Signed)
[facility] Navigation: Follow-Up  Completed with:     OPN provided follow-up call to review previously discussed resources/identified barriers and evaluate current needs. Patient reports that she is doing pretty good at current time and she denies any new practical, financial and/or emotional concerns. Patient continues to report that she is coping effectively and shared personal coping strategies which include her faith, family support and seeing a personal therapist on a regular basis. OPN did remind patient of resources available through [facility], including support groups, counseling and the [facility]. Patient potentially interested in joining a support group and/or connecting with other patients at this time, so OPN will send information for Coping with Cancer as well as the Buddy Program through the [facility] via MyChart message. Patient verbalized personal hopes to use her voice and be an advocate for other patients in the future. She expressed appreciation for call and welcomed ongoing follow up. Additional follow-up call will occur in the coming weeks.    Horris Latino  Oncology Patient Navigator  [phone]

## 2021-12-26 ENCOUNTER — Ambulatory Visit: Admit: 2021-12-26 | Discharge: 2021-12-26 | Payer: PRIVATE HEALTH INSURANCE

## 2021-12-26 ENCOUNTER — Other Ambulatory Visit: Admit: 2021-12-26 | Discharge: 2021-12-26 | Payer: PRIVATE HEALTH INSURANCE

## 2021-12-26 ENCOUNTER — Ambulatory Visit
Admit: 2021-12-26 | Discharge: 2021-12-26 | Payer: PRIVATE HEALTH INSURANCE | Attending: Registered" | Primary: Registered"

## 2021-12-26 DIAGNOSIS — E119 Type 2 diabetes mellitus without complications: Principal | ICD-10-CM

## 2021-12-26 DIAGNOSIS — C187 Malignant neoplasm of sigmoid colon: Principal | ICD-10-CM

## 2021-12-26 DIAGNOSIS — C189 Malignant neoplasm of colon, unspecified: Principal | ICD-10-CM

## 2021-12-26 DIAGNOSIS — Z79899 Other long term (current) drug therapy: Principal | ICD-10-CM

## 2021-12-26 LAB — COMPREHENSIVE METABOLIC PANEL
ALBUMIN: 3.7 g/dL (ref 3.4–5.0)
ALKALINE PHOSPHATASE: 100 U/L (ref 46–116)
ALT (SGPT): 49 U/L (ref 10–49)
ANION GAP: 10 mmol/L (ref 5–14)
AST (SGOT): 71 U/L — ABNORMAL HIGH (ref <=34)
BILIRUBIN TOTAL: 0.5 mg/dL (ref 0.3–1.2)
BLOOD UREA NITROGEN: 10 mg/dL (ref 9–23)
BUN / CREAT RATIO: 20
CALCIUM: 9.9 mg/dL (ref 8.7–10.4)
CHLORIDE: 100 mmol/L (ref 98–107)
CO2: 26 mmol/L (ref 20.0–31.0)
CREATININE: 0.5 mg/dL — ABNORMAL LOW
EGFR CKD-EPI (2021) FEMALE: >90 mL/min/{1.73_m2} (ref >=60)
GLUCOSE RANDOM: 295 mg/dL — ABNORMAL HIGH (ref 70–179)
POTASSIUM: 3.9 mmol/L (ref 3.4–4.8)
PROTEIN TOTAL: 6.9 g/dL (ref 5.7–8.2)
SODIUM: 136 mmol/L (ref 135–145)

## 2021-12-26 LAB — CBC W/ AUTO DIFF
BASOPHILS ABSOLUTE COUNT: 0.1 10*9/L (ref 0.0–0.1)
BASOPHILS RELATIVE PERCENT: 1.1 %
EOSINOPHILS ABSOLUTE COUNT: 0.3 10*9/L (ref 0.0–0.5)
EOSINOPHILS RELATIVE PERCENT: 6.7 %
HEMATOCRIT: 37.8 % (ref 34.0–44.0)
HEMOGLOBIN: 12.2 g/dL (ref 11.3–14.9)
LYMPHOCYTES ABSOLUTE COUNT: 1.4 10*9/L (ref 1.1–3.6)
LYMPHOCYTES RELATIVE PERCENT: 27.6 %
MEAN CORPUSCULAR HEMOGLOBIN CONC: 32.3 g/dL (ref 32.0–36.0)
MEAN CORPUSCULAR HEMOGLOBIN: 27.5 pg (ref 25.9–32.4)
MEAN CORPUSCULAR VOLUME: 85.2 fL (ref 77.6–95.7)
MEAN PLATELET VOLUME: 7.2 fL (ref 6.8–10.7)
MONOCYTES ABSOLUTE COUNT: 0.6 10*9/L (ref 0.3–0.8)
MONOCYTES RELATIVE PERCENT: 11.2 %
NEUTROPHILS ABSOLUTE COUNT: 2.7 10*9/L (ref 1.8–7.8)
NEUTROPHILS RELATIVE PERCENT: 53.4 %
PLATELET COUNT: 332 10*9/L (ref 150–450)
RED BLOOD CELL COUNT: 4.44 10*12/L (ref 3.95–5.13)
RED CELL DISTRIBUTION WIDTH: 17.3 % — ABNORMAL HIGH (ref 12.2–15.2)
WBC ADJUSTED: 5.1 10*9/L (ref 3.6–11.2)

## 2021-12-26 LAB — MAGNESIUM: MAGNESIUM: 1.7 mg/dL (ref 1.6–2.6)

## 2021-12-26 MED ORDER — FAMOTIDINE 40 MG TABLET
ORAL_TABLET | Freq: Two times a day (BID) | ORAL | 1 refills | 30 days | Status: CP
Start: 2021-12-26 — End: 2022-01-25

## 2021-12-26 MED ADMIN — oxaliplatin (ELOXATIN) 314.6 mg in dextrose 5 % 250 mL IVPB: 130 mg/m2 | INTRAVENOUS | @ 16:00:00 | Stop: 2021-12-26

## 2021-12-26 MED ADMIN — dextrose 5 % infusion: 100 mL/h | INTRAVENOUS | @ 16:00:00

## 2021-12-26 MED ADMIN — dexAMETHasone (DECADRON) tablet 8 mg: 8 mg | ORAL | @ 16:00:00 | Stop: 2021-12-26

## 2021-12-26 MED ADMIN — LORazepam (ATIVAN) injection 0.5 mg: .5 mg | INTRAVENOUS | @ 18:00:00 | Stop: 2021-12-26

## 2021-12-26 MED ADMIN — ondansetron (ZOFRAN) tablet 24 mg: 24 mg | ORAL | @ 16:00:00 | Stop: 2021-12-26

## 2021-12-26 NOTE — Unmapped (Signed)
Lab on 12/26/2021   Component Date Value Ref Range Status    Sodium 12/26/2021 136  135 - 145 mmol/L Final    Potassium 12/26/2021 3.9  3.4 - 4.8 mmol/L Final    Chloride 12/26/2021 100  98 - 107 mmol/L Final    CO2 12/26/2021 26.0  20.0 - 31.0 mmol/L Final    Anion Gap 12/26/2021 10  5 - 14 mmol/L Final    BUN 12/26/2021 10  9 - 23 mg/dL Final    Creatinine 16/08/9603 0.50 (L)  0.60 - 0.80 mg/dL Final    BUN/Creatinine Ratio 12/26/2021 20   Final    eGFR CKD-EPI (2021) Female 12/26/2021 >90  >=60 mL/min/1.73m2 Final    eGFR calculated with CKD-EPI 2021 equation in accordance with [employer] and [employer] of Nephrology Task Force recommendations.    Glucose 12/26/2021 295 (H)  70 - 179 mg/dL Final    Calcium 54/07/8118 9.9  8.7 - 10.4 mg/dL Final    Albumin 14/78/2956 3.7  3.4 - 5.0 g/dL Final    Total Protein 12/26/2021 6.9  5.7 - 8.2 g/dL Final    Total Bilirubin 12/26/2021 0.5  0.3 - 1.2 mg/dL Final    AST 21/30/8657 71 (H)  <=34 U/L Final    ALT 12/26/2021 49  10 - 49 U/L Final    Alkaline Phosphatase 12/26/2021 100  46 - 116 U/L Final    Magnesium 12/26/2021 1.7  1.6 - 2.6 mg/dL Final    WBC 84/69/6295 5.1  3.6 - 11.2 10*9/L Final    RBC 12/26/2021 4.44  3.95 - 5.13 10*12/L Final    HGB 12/26/2021 12.2  11.3 - 14.9 g/dL Final    HCT 28/41/3244 37.8  34.0 - 44.0 % Final    MCV 12/26/2021 85.2  77.6 - 95.7 fL Final    MCH 12/26/2021 27.5  25.9 - 32.4 pg Final    MCHC 12/26/2021 32.3  32.0 - 36.0 g/dL Final    RDW 11/25/7251 17.3 (H)  12.2 - 15.2 % Final    MPV 12/26/2021 7.2  6.8 - 10.7 fL Final    Platelet 12/26/2021 332  150 - 450 10*9/L Final    Neutrophils % 12/26/2021 53.4  % Final    Lymphocytes % 12/26/2021 27.6  % Final    Monocytes % 12/26/2021 11.2  % Final    Eosinophils % 12/26/2021 6.7  % Final    Basophils % 12/26/2021 1.1  % Final    Absolute Neutrophils 12/26/2021 2.7  1.8 - 7.8 10*9/L Final    Absolute Lymphocytes 12/26/2021 1.4  1.1 - 3.6 10*9/L Final    Absolute Monocytes 12/26/2021 0.6  0.3 - 0.8 10*9/L Final    Absolute Eosinophils 12/26/2021 0.3  0.0 - 0.5 10*9/L Final    Absolute Basophils 12/26/2021 0.1  0.0 - 0.1 10*9/L Final    Anisocytosis 12/26/2021 Slight (A)  Not Present Final

## 2021-12-26 NOTE — Unmapped (Signed)
Brief Adult Oncology Infusion Clinic Note:    Paged for patient with c/o nausea during chemotherapy.    [name] is in the infusion clinic to receive C3D1 Capecitabine + Oxaliplatin. She is experiencing nausea despite taking premedication Dexamethasone and Ondansetron. I have placed order for Lorazepam 0.5 mg IV x 1 dose. Infusion nurse notified.           Montel Culver, ANP  ADVANCED PRACTICE PROVIDER FOR INFUSION PROGRAM

## 2021-12-26 NOTE — Unmapped (Signed)
PIV inserted by Colleen Purcell RN. Labs drawn and sent.

## 2021-12-26 NOTE — Unmapped (Signed)
Patient completed and tolerated treatment. Patient report some nausea towards end of infusion, APP notified. Ativan ordered and given to patient. AVS declined and patient discharged to home.

## 2021-12-26 NOTE — Unmapped (Signed)
It was a pleasure to see you today!    Summary of Visit:  Will see you in three weeks for your last treatment and in six weeks for labs, visit, and CT chest.    Refilld your famotidine    Sent in referral to cardiology at [facility].     Your GI Oncology Team:  Medical Oncologist: Susa Griffins, MD  Medical Oncology Nurse Practitioner: Ophelia Charter, DNP  Medical Oncology Nurse Navigator: Nonie Hoyer, RN  Clinical Pharmacist Practitioner: Konrad Penta, PharmD  Scheduling Administrator: Ms. Lollie Sails    For appointments & urgent questions Monday through Friday 8 AM--5 PM:  Please call [phone] and ask to speak to the triage nurse    For urgent clinical needs on Nights, Weekends or Holidays:  Please call [phone] and you will be connected to [facility] Nurse Connect.     For appointment changes, please contact us during normal business hours, as we do not have staff to assist with scheduling after hours. For routine prescription refills, please contact us during business hours.     We highly recommend getting a [email or web address] as a means to review provider notes, labs, scans, and other results. It is our preferred method of communication. You may also use this to communicate non-urgent messages to your oncology team. It may take 24-48 hours for your team to answer you.     For complex questions or concerns, we will recommend a clinic visit, as complex medical decision-making should not be made via MyChart. For non-urgent questions, please consider waiting until your next clinic visit to discuss with your medical team when a thorough assessment can be performed and treatment options can be discussed.    ---------------------  Please visit [email or web address], a resource created just for family members and caregivers.  This website lists support services, how and where to ask for help. It has tools to assist you as you help [place] care for your loved one.    [facility]  [address]  [place], [place] [address]  [email or web address]

## 2021-12-29 ENCOUNTER — Ambulatory Visit: Payer: BC Managed Care – PPO | Admitting: Neurology

## 2021-12-29 ENCOUNTER — Encounter: Payer: Self-pay | Admitting: Neurology

## 2021-12-29 ENCOUNTER — Telehealth: Payer: Self-pay | Admitting: Neurology

## 2021-12-29 NOTE — Telephone Encounter (Signed)
Pt cancelled appt due not feeling well from taking Chemo.

## 2021-12-31 NOTE — Unmapped (Signed)
TC to Ms Nina Mitchell to assess how she's doing. She is doing fairly well but she is having more fatigue and nausea. She is able to eat and drink. We discussed going back to scheduling anti-nausea medicines for the next few days, re-starting the Zyprexa at night that she has at home but only used once or twice and having phenergan to alternate with the Zofran during the day.  She will try scheduling these meds for a few days and let us know how she is doing.   We also discussed that her glucoses have been running high and getting them back down may play a role in reducing her fatigue levels.  She does have RX for Ozempic an will get back on that medicine.   We discussed hydration in the form of sugar free popsicles, sugar-free jello, canned fruits, melons and trying to eat smaller meals more frequently during the day. Dry salty carbs like pretzels, goldfish, and saltines are good to have as small snacks during day (given DM, need to use sparingly).   Emotional support and encouragement provided as she only has one more cycle to go.   Will update Dr Rozell Searing and Ms Mardene Speak, RN, need Phenergan RX sent to her pharmacy.

## 2022-01-02 DIAGNOSIS — I739 Peripheral vascular disease, unspecified: Principal | ICD-10-CM

## 2022-01-02 MED ORDER — DILTIAZEM ER 300 MG CAPSULE,24 HR,EXTENDED RELEASE
ORAL_CAPSULE | Freq: Every day | ORAL | 1 refills | 30 days | Status: CP
Start: 2022-01-02 — End: 2022-03-03

## 2022-01-02 NOTE — Unmapped (Addendum)
Called and spoke to patient to follow up on MyChart message. Patient's nausea is better controlled as she is following recommendations from team given on Wednesday. Patient reports increased HR (she has not taken her HR) and the feeling of thumping in her chest. States that she is still able to feel some spasms, not intense like they were before. Still feeling some tightness and mild pain. Started about 3 days ago. Patient has been taking diltiazem 420 mg daily and Imdur 30 mg daily. She has not taken any of the short-acting nitroglycerin as she states that this makes her feel worse than the spasms.    Patient was scheduled to see cardiology at [facility] today but did not want to make the drive as she sometimes experiences double vision after treatment. Spouse was not able to drive her today, so she rescheduled the appointment to 3/17.    Patient is asking about whether she should increase the diltiazem as she was told that there was room for increase at her last cardiology appointment. Advised patient that I would reach out to oncology team for recommendations. Advised patient to call [facility] cardiology office who prescribed the diltiaizem to see if they would be able to give her a recommendation over the phone. Then we would touch base. She verbalized understanding.     Discussed with Dr. Rozell Searing. Patient reports a HR of 95 and then 111. Dr. Rozell Searing sent in new prescription for diltiazem to [facility]. Sent patient this message in MyChart.

## 2022-01-02 NOTE — Unmapped (Signed)
Patient reports that she has having some intermittent pain and tachycardia/palpitations, and spoke with her oncologist, who increased her diltiazem to 600mg  daily, and recommended making sure she was managing her nausea appropriately. I would not make any further medication changes.

## 2022-01-02 NOTE — Unmapped (Signed)
[facility] Refill Coordination Note    Specialty Medication(s) to be Shipped:   Hematology/Oncology: Capecitabine 500mg , directions: Take 4 tablets (2,000 mg total) by mouth Two (2) times a day  with 1 other capecitabine prescription for 2,300 mg total. Take Days 1 through 14, then 1 week off. and Capecitabine 150mg , directions: Take 2 tablets (300 mg total) by mouth Two (2) times a day  with 1 other capecitabine prescription for 2,300 mg total. Take Days 1 through 14, then 1 week off.    Other medication(s) to be shipped: No additional medications requested for fill at this time     Nina Mitchell, DOB: [birth year]  Phone: [phone] (home) [phone] (work)      All above HIPAA information was verified with patient.     Was a Nurse, learning disability used for this call? No    Completed refill call assessment today to schedule patient's medication shipment from the [facility] [phone]).  All relevant notes have been reviewed.     Specialty medication(s) and dose(s) confirmed: Regimen is correct and unchanged.   Changes to medications: Nina Mitchell reports no changes at this time.  Changes to insurance: No  New side effects reported not previously addressed with a pharmacist or physician: None reported  Questions for the pharmacist: No    Confirmed patient received a Conservation officer, historic buildings and a Surveyor, mining with first shipment. The patient will receive a drug information handout for each medication shipped and additional FDA Medication Guides as required.       DISEASE/MEDICATION-SPECIFIC INFORMATION        N/A    SPECIALTY MEDICATION ADHERENCE     Medication Adherence    Patient reported X missed doses in the last month: 0  Specialty Medication: Capecitabine 150mg   Patient is on additional specialty medications: Yes  Additional Specialty Medications: Capecitabine 500mg   Patient Reported Additional Medication X Missed Doses in the Last Month: 0  Patient is on more than two specialty medications: No  Informant: patient              Were doses missed due to medication being on hold? No    Capecitabine 150 mg: 6 days of medicine on hand.  New cycle starts 01/16/22   Capecitabine 500 mg: 6 days of medicine on hand       REFERRAL TO PHARMACIST     Referral to the pharmacist: Not needed      [facility]     Shipping address confirmed in Epic.     Delivery Scheduled: Yes, Expected medication delivery date: 01/13/22.     Medication will be delivered via UPS to the prescription address in Epic [place].    Wyatt Mage M Elisabeth Cara   [facility] Specialty Technician

## 2022-01-02 NOTE — Unmapped (Signed)
Patient called asking if her diltiazem can be increased.  Reporting heart rates 120 - 130's  States heart feels like its thumping, still having occasional spasms  Patient concerned.  Patient can be reached at [phone]  [facility]

## 2022-01-12 MED FILL — CAPECITABINE 500 MG TABLET: ORAL | 21 days supply | Qty: 112 | Fill #3

## 2022-01-12 MED FILL — CAPECITABINE 150 MG TABLET: ORAL | 21 days supply | Qty: 56 | Fill #3

## 2022-01-13 NOTE — Unmapped (Signed)
[facility] GI MEDICAL ONCOLOGY CLINIC NOTE    Encounter Date: 01/16/2022    PCP:   Terrall Laity, PA    Consulting Providers:  Dr. Alden Server [facility]   ____________________________________________________________________  Oncology History:   Diagnosis: sigmoid colon adenocarcinoma  Current Goal of Therapy: adjuvant, curative  - 08/2021 patient underwent EGD and full colonoscopy in setting of generalized abdominal pain and change in bowel habits revealing a mass in sigmoid colon with near total obstruction that was biopsied as moderately differentiated adenocarcinoma. Also had a descending colon tubulovillous adenoma and a tubular adenoma in transverse colon. Stomach biopsy revealed chronic gastritis (no H. Pylori). CEA 1.   - 09/05/21 open sigmoid colectomy with primary anastomosis with pathology revealing moderately differentiated adenocarcinoma invading through MP into pericolonic tissues, negative margins, no PNI/LVI, no tumor deposits or perforation, 3/17 LNs positive for cancer, pMMR. Staged as pT3N1b  - 11/15-11/21/22 admitted with port infection (MSSA bacteremia) treated with nafcillin  - 10/24/21-current CAPOX x 4 cycles    - C1 complicated by admission for presumed coronary vasospasm occurring on 10/30/21. Did not take more capecitabine after that point   - 12/05/21 C2 no issues    - 12/26/21 C3 no issues   - 01/16/22 C4  Molecular: pMMR  Signatera CtDNA: negative 11/10/21  ____________________________________________________________________  Assessment and Plan:  Nina Mitchell is a 51 y.o. female who presented for evaluation and recommendations regarding sigmoid colon adenocarcinoma.     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 For her low-risk stage III colon cancer, we discussed adjuvant therapy with CAPOX x 3 months. C1 was complicated by coronary vasospasm (presumed 2/2 5FU). Has since had a full cardiac work-up and with addition of CCB and long acting nitrate, she was able to tolerate subsequent cycles thus far without issue. CtDNA negative after C1 but we decided to continue with four cycles of adjuvant therapy.    - continue CAPOX today, today is C4D1=01/16/22  - RTC in 3 weeks for tox check after finishing chemotherapy   - Surveillance:              - H&P + CEA: every 3-6 months x 2 years, then every 6 months x 3 years              - Scans: annually, repeat CT chest after adjuvant therapy, full body scans due 06/2022              - Colonoscopy: due 08/2022   - ctDNA: Signatera to draw every 3 months     Coronary Vasospasm: Occurred about 8 days after C1D1 CAPOX. Was admitted for chest pain secondary to this. Work-up was relatively negative including NM stress test and ECG. Per cardiology, most likely etiology is 2/2 5FU. She has since been started on CCB long acting nitrate with overall well controlled spasms but did have some increased HR with C3 so increased her CCB.    - continue diltiazem XR 600mg  daily (max 960mg  if needed)  - continue Imdur 30mg  daily   - follow up cardiology follow up 02/06/22, initial [place] visit with Dr. Lona Millard    > this will be after completing adjuvant chemotherapy    > patient previously on ACE-I and BB before switching to dilt/Imdur for vasospasms    Recent MSSA Bacteremia - Port Infection: now s/p several weeks of  nafcillin (completed 10/23/21)  - s/p PICC line removal after cycle 1    Pulmonary Nodules: multiple pulmonary nodules noted on 09/29/21 scans that were not definitively seen on previous scans (though reports note some nodules in past). We will keep a close eye on these and repeat imaging after finishing adjuvant therapy.  - CT chest 02/06/22  ??  T2DM: managed by local PCP. Recently started Ozempic. Her BG remains fairly high and she's had some weight gain. Recommended she follow-up with PCP  - managed by PCP  - continue Ozempic, currently on 0.5 mg SQ weekly  - endorses prior intolerance of metformin and liraglutide    HTN:   - currently on diltiazem and Imdur as above for coronary vasospasms  - previous lisinopril held by prior cardiologist  - previous atenolol (for migraine symptoms) held by prior cardiologist  - f/u cardiology 02/06/22    Supportive Care:  CINV: PRN Zofran and Clonopin for second line, prior QTc Fridericia 392 ms prior to vasospasms, 450s afterwards    Anxiety and Depression:  - escitalopram 20mg  daily  - Klonopin 0.5mg  PRN    Follow-Up:   RTC in 3 weeks for labs, Ophelia Charter, and CT chest (scheduled)    Discussed and seen with attending physician Dr. Phil Dopp, MD.  Barney Drain, MD/PhD  Hematology/Oncology Fellow, PGY-4    _____________________________________________________________________  Interval History:Nina Mitchell is a 52 y.o. female who presented for a return visit regarding colon adenocarcinoma.     - Called in on 2/10 with some feeling that her heart was beating quickly while taking her 400mg  diltiazem and Imdur 30mg  daily. No chest pain or palpitations since increasing diltiazem XR to 600 mg. BP 141/85, HR 84, which patient states is comparable to home measurements.  - Transitioning cardiologist to [facility], first visit 3/17 (after completing adjuvant chemotherapy)  - Ozempic titrated to 0.5 mg SQ weekly by PCP, typical FBG ~200 and PPBG ~180  - Nausea: treated during C3 infusion with Ativan. While at home, usually uses Zofran uses 3 times daily while on capecitabine, 1-2 times daily while off  - Diarrhea: loperamide used 1-2 times per cycle  - No neuropathy, some cold-sensitivity which resolves by days 5-7 of each cycle    Past Medical History:  HTN  T2DM  HLD  GERD  Hypothyroidism  Mood disorder   Migraine previously on beta blocker  ?Multiple sclerosis - previously diagnosed circa 2008, although subsequently followed by a neurologist who questioned this diagnosis    Social History:  Never smoker  Rarely consumes alcohol (1-2x per year, mixed drink)    Review of Systems:  A ten-point review of systems was conducted and negative unless stated in HPI.    Physical Exam:  VITALS: BP 141/85  - Pulse 84  - Temp 36.8 ??C (98.3 ??F) (Oral)  - Wt (!) 126.1 kg (278 lb 1.6 oz)  - SpO2 98%  - BMI 44.91 kg/m??   CONSTITUTIONAL: in NAD, appears stated age  HEENT: mask in place   EXTREMITIES: no LE edema  ABDOMEN: soft, non-tender, obese but non-distended, prior laparoscopic incisions well-healed  SKIN: no rashes or eccymoses, scar from site of prior RIJ port well-healed  NEUROLOGIC: alert and oriented, no gross focal deficits noted, SILT  LYMPH: no supraclavicular, axillary, or cervical LAD  PSYCH: appropriate mood and affect, good insight and judgment     Labs:  Reviewed In Epic    Recent Labs     01/16/22  [phone]  WBC 6.3   NEUTROABS 3.8   HGB 13.2   HCT 40.3   PLT 297     Recent Labs     01/16/22  0807   K 3.4   CREATININE 0.41*   MG 1.7     No results for input(s): PROT, ALBUMIN, BILITOT, BILIDIR, AST, ALT, ALKPHOS in the last 72 hours.    No results for input(s): PT, INR, APTT, DDIMER, FIBRINOGEN in the last 72 hours.    All labs adequate for treatment    Imaging:  I personally reviewed the following imaging studies and my interpretation is:   No new imaging to review

## 2022-01-16 ENCOUNTER — Other Ambulatory Visit: Admit: 2022-01-16 | Discharge: 2022-01-16 | Payer: PRIVATE HEALTH INSURANCE

## 2022-01-16 ENCOUNTER — Ambulatory Visit: Admit: 2022-01-16 | Discharge: 2022-01-16 | Payer: PRIVATE HEALTH INSURANCE

## 2022-01-16 DIAGNOSIS — C187 Malignant neoplasm of sigmoid colon: Principal | ICD-10-CM

## 2022-01-16 LAB — CBC W/ AUTO DIFF
BASOPHILS ABSOLUTE COUNT: 0.1 10*9/L (ref 0.0–0.1)
BASOPHILS RELATIVE PERCENT: 0.8 %
EOSINOPHILS ABSOLUTE COUNT: 0.4 10*9/L (ref 0.0–0.5)
EOSINOPHILS RELATIVE PERCENT: 5.9 %
HEMATOCRIT: 40.3 % (ref 34.0–44.0)
HEMOGLOBIN: 13.2 g/dL (ref 11.3–14.9)
LYMPHOCYTES ABSOLUTE COUNT: 1.5 10*9/L (ref 1.1–3.6)
LYMPHOCYTES RELATIVE PERCENT: 24.3 %
MEAN CORPUSCULAR HEMOGLOBIN CONC: 32.7 g/dL (ref 32.0–36.0)
MEAN CORPUSCULAR HEMOGLOBIN: 29 pg (ref 25.9–32.4)
MEAN CORPUSCULAR VOLUME: 88.6 fL (ref 77.6–95.7)
MEAN PLATELET VOLUME: 7.1 fL (ref 6.8–10.7)
MONOCYTES ABSOLUTE COUNT: 0.5 10*9/L (ref 0.3–0.8)
MONOCYTES RELATIVE PERCENT: 8.3 %
NEUTROPHILS ABSOLUTE COUNT: 3.8 10*9/L (ref 1.8–7.8)
NEUTROPHILS RELATIVE PERCENT: 60.7 %
PLATELET COUNT: 297 10*9/L (ref 150–450)
RED BLOOD CELL COUNT: 4.55 10*12/L (ref 3.95–5.13)
RED CELL DISTRIBUTION WIDTH: 20.9 % — ABNORMAL HIGH (ref 12.2–15.2)
WBC ADJUSTED: 6.3 10*9/L (ref 3.6–11.2)

## 2022-01-16 LAB — MAGNESIUM: MAGNESIUM: 1.7 mg/dL (ref 1.6–2.6)

## 2022-01-16 LAB — CREATININE
CREATININE: 0.41 mg/dL — ABNORMAL LOW
EGFR CKD-EPI (2021) FEMALE: >90 mL/min/{1.73_m2} (ref >=60)

## 2022-01-16 LAB — POTASSIUM: POTASSIUM: 3.4 mmol/L (ref 3.4–4.8)

## 2022-01-16 MED ADMIN — ondansetron (ZOFRAN) tablet 24 mg: 24 mg | ORAL | @ 16:00:00 | Stop: 2022-01-16

## 2022-01-16 MED ADMIN — oxaliplatin (ELOXATIN) 314.6 mg in dextrose 5 % 250 mL IVPB: 130 mg/m2 | INTRAVENOUS | @ 17:00:00 | Stop: 2022-01-16

## 2022-01-16 MED ADMIN — dextrose 5 % infusion: 100 mL/h | INTRAVENOUS | @ 17:00:00

## 2022-01-16 MED ADMIN — dexAMETHasone (DECADRON) tablet 8 mg: 8 mg | ORAL | @ 16:00:00 | Stop: 2022-01-16

## 2022-01-16 MED ADMIN — LORazepam (ATIVAN) injection 0.5 mg: .5 mg | INTRAVENOUS | @ 18:00:00 | Stop: 2022-01-16

## 2022-01-16 NOTE — Unmapped (Signed)
It was a pleasure to see you today!    Summary of Visit:  - You will get your last cycle of chemotherapy today!  - We will see you back in 3 weeks to discuss surveillance.  - Please let us know if you have any concerning chest pain, palpitations, rash, or excessive nausea/vomiting.  - After you finish your chemotherapy, we will defer your heart medications to your cardiologist Dr. Lona Millard.    Your GI Oncology Team:  Medical Oncologist: Susa Griffins, MD  Medical Oncology Nurse Practitioner: Ophelia Charter, DNP  Medical Oncology Nurse Navigator: Nonie Hoyer, RN  Clinical Pharmacist Practitioner: Konrad Penta, PharmD  Scheduling Administrator: Ms. Lollie Sails    For appointments & urgent questions Monday through Friday 8 AM--5 PM:  Please call [phone] and ask to speak to the triage nurse    For urgent clinical needs on Nights, Weekends or Holidays:  Please call [phone] and you will be connected to [facility] Nurse Connect.     For appointment changes, please contact us during normal business hours, as we do not have staff to assist with scheduling after hours. For routine prescription refills, please contact us during business hours.     We highly recommend getting a [email or web address] as a means to review provider notes, labs, scans, and other results. It is our preferred method of communication. You may also use this to communicate non-urgent messages to your oncology team. It may take 24-48 hours for your team to answer you.     For complex questions or concerns, we will recommend a clinic visit, as complex medical decision-making should not be made via MyChart. For non-urgent questions, please consider waiting until your next clinic visit to discuss with your medical team when a thorough assessment can be performed and treatment options can be discussed.    ---------------------  Please visit [email or web address], a resource created just for family members and caregivers.  This website lists support services, how and where to ask for help. It has tools to assist you as you help [place] care for your loved one.    [facility]  [address]  [place], [place] [address]  [email or web address]

## 2022-01-16 NOTE — Unmapped (Signed)
PT in lab for PIV placement, 22 gauge placed in LAC by CP RN labs sent for analysis. Blood return brisk, saline locked.

## 2022-01-16 NOTE — Unmapped (Signed)
Brief Adult Oncology Infusion Clinic Note:    Paged regarding patient with c/o anxiety while in the infusion clinic.     [name] is in the infusion clinic for C4D1 Oxaliplatin. She has history of anxiety and takes anxiolytics at home PRN. Today she does not have her home medications with her and is feeling anxious. I have placed an order for Lorazepam 0.5mg  IV x 1 dose while in the infusion clinic. Infusion nurse notified.                                 Montel Culver, ANP  ADVANCED PRACTICE PROVIDER FOR INFUSION PROGRAM

## 2022-01-17 NOTE — Unmapped (Signed)
Pt received in NAD. PIV accessed by triage nurse and positive blood return noted prior to initiation of treatment. Treatment completed without difficulty. PIV flushed and deacessed. Patient discharged ambulatory in NAD.

## 2022-01-20 MED ORDER — OLANZAPINE 5 MG TABLET
ORAL_TABLET | Freq: Every evening | ORAL | 0 refills | 15 days | Status: CP | PRN
Start: 2022-01-20 — End: 2023-01-20

## 2022-01-20 MED ORDER — CLONAZEPAM 0.5 MG TABLET
ORAL_TABLET | Freq: Two times a day (BID) | ORAL | 0 refills | 15 days | Status: CP | PRN
Start: 2022-01-20 — End: ?

## 2022-01-20 NOTE — Unmapped (Signed)
Returned call to patient. She had last cycle of Capeox which started Friday, 2/24. States that she's had nausea and vomiting since Friday. Had episode of emesis on Friday afternoon when leaving Infusion, in parking lot. Two episodes of vomiting yesterday, 1 episode this morning. Patient does not think she has vomited any of the chemotherapy pills. Nausea is not constant but when it comes on, it comes on strong. Has been in bed for the most part since Friday, does not have energy to work. Patient is moving bowels at least once per day and urinating without difficulty or concern.     Patient reports decreased appetite. However she said that she had some breakfast casserole (eggs and cheese) yesterday, also had a small cheeseburger. Both were okay on her stomach but she reports that she did vomit later that evening. She feels she is drinking plenty of fluids - water, orange juice, apple juice.     Patient states that her weight on Friday at clinic was 278 pounds, and her weight on her scale this morning was 262 pounds. She is concerned that the oral chemotherapy dose may be too strong based on her weight.     Patient did experience spasms yesterday but none today.     Patient is taking zofran every 8 hours and Zyprexa at night. She takes Klonopin as needed, often one in the morning and one later in the day. We discussed that if nausea seems worse in the morning - keep crackers or something plain by the bed to eat before getting up. Patient is frustrated that she is feeling this way and overwhelmed, wants to quit and throw out the pills. She reports that she is looking for some reinforcement. We discussed that she is so close to being done with treatment, but she states that she cannot continue to lose weight. Advised patient that I would reach out to team to report side effects and concerns and follow up with her. She verbalized understanding and expressed appreciation for the call.

## 2022-01-22 DIAGNOSIS — C187 Malignant neoplasm of sigmoid colon: Principal | ICD-10-CM

## 2022-01-22 MED ORDER — CAPECITABINE 500 MG TABLET
ORAL_TABLET | Freq: Two times a day (BID) | ORAL | 3 refills | 14 days | Status: CN
Start: 2022-01-22 — End: ?

## 2022-01-22 MED ORDER — CAPECITABINE 150 MG TABLET
ORAL_TABLET | Freq: Two times a day (BID) | ORAL | 3 refills | 14 days | Status: CN
Start: 2022-01-22 — End: ?

## 2022-01-23 NOTE — Unmapped (Signed)
Pt is on their last cycle of Capecitabine and can be disenrolled from our services.

## 2022-01-23 NOTE — Unmapped (Signed)
Called and spoke to patient. She reports that nausea and appetite slightly improved. She reports that she had a spasm yesterday in the middle of the day and again today. She did take Klonipin which was helpful. She is taking Imdur and diltiazem as prescribed. She is taking the klonipin prior to her capecitabine doses and does not know if it is okay for her to take additional doses if/when she has a spasm. She is asking about whether she should have an increased dose of diltiazem.     Discussed with Dr. Rozell Searing. Will not increase diltiazem at this time, but okay for patient to take klonipin if needed. MyChart message sent to patient.

## 2022-01-24 MED ORDER — ISOSORBIDE MONONITRATE ER 30 MG TABLET,EXTENDED RELEASE 24 HR
ORAL_TABLET | 2 refills | 0 days
Start: 2022-01-24 — End: ?

## 2022-01-24 MED ORDER — FAMOTIDINE 40 MG TABLET
ORAL_TABLET | 1 refills | 0 days
Start: 2022-01-24 — End: ?

## 2022-01-26 ENCOUNTER — Other Ambulatory Visit: Payer: Self-pay

## 2022-01-26 ENCOUNTER — Encounter: Payer: Self-pay | Admitting: Neurology

## 2022-01-26 ENCOUNTER — Ambulatory Visit (INDEPENDENT_AMBULATORY_CARE_PROVIDER_SITE_OTHER): Payer: BC Managed Care – PPO | Admitting: Neurology

## 2022-01-26 DIAGNOSIS — G4733 Obstructive sleep apnea (adult) (pediatric): Secondary | ICD-10-CM

## 2022-01-26 DIAGNOSIS — C189 Malignant neoplasm of colon, unspecified: Secondary | ICD-10-CM

## 2022-01-26 DIAGNOSIS — M47816 Spondylosis without myelopathy or radiculopathy, lumbar region: Secondary | ICD-10-CM | POA: Diagnosis not present

## 2022-01-26 DIAGNOSIS — G43709 Chronic migraine without aura, not intractable, without status migrainosus: Secondary | ICD-10-CM | POA: Diagnosis not present

## 2022-01-26 DIAGNOSIS — M542 Cervicalgia: Secondary | ICD-10-CM

## 2022-01-26 DIAGNOSIS — R110 Nausea: Secondary | ICD-10-CM

## 2022-01-26 MED ORDER — ISOSORBIDE MONONITRATE ER 30 MG TABLET,EXTENDED RELEASE 24 HR
ORAL_TABLET | 2 refills | 0 days | Status: CP
Start: 2022-01-26 — End: ?

## 2022-01-26 MED ORDER — FAMOTIDINE 40 MG TABLET
ORAL_TABLET | 1 refills | 0 days | Status: CP
Start: 2022-01-26 — End: ?

## 2022-01-26 NOTE — Unmapped (Signed)
Please refill if appropriate

## 2022-01-26 NOTE — Unmapped (Signed)
[facility] Central Navigation: Follow-Up  Completed with: Patient     Oncology Patient Navigator (OPN) provided follow-up call to review previously discussed resources/identified barriers and evaluate current needs. Patient reports that she is doing pretty well right now and is grateful to be finished with her chemo. OPN provided active and empathetic listening as she provided review of her progress since our previous call. She acknowledged that things have been very challenging due to side effects from treatment but she is grateful that things are now beginning to improve.     Patient reports that she is still interested in potentially joining a support group and is hopeful that she can connect with one now that her treatment is completed. She is also hopeful to start going back to her local gym and states that she wants to focus on overall healthy improvements. OPN reviewed available resources through [facility] and patient verbalized understanding. She reports that she has a Careers information officer that she sees already 2x/month and feels that she is coping effectively at this time. OPN will send information about upcoming nutrition classes at [facility] as patient expressed concern about recent weight loss. (She is already connected with a dietitian)     Patient endorsed understanding of upcoming appointment times/location as well as how to contact medical team if needed. Patient requests that OPN follow up in the coming weeks, with hopes that she will be ready to further investigate the resources we discussed at that time. Additional follow-up call will occur in the coming weeks.

## 2022-01-26 NOTE — Progress Notes (Signed)
Botox- 200 units x 1 vial ?Lot: [id number] ?Expiration: 07/2024 ?[place]: [phone] ? ?Bacteriostatic 0.9% Sodium Chloride- 43m total ?Lot: [id number]?Expiration: 06/24/2023 ?[place] [address]-71? ?Dx: GX94.129?S/P  ?

## 2022-01-26 NOTE — Progress Notes (Signed)
[facility]  PATIENT: Pamela Murphy DOB: [birth year]  REASON FOR VISIT: Chronic Migraine headaches   HISTORICAL  CHIEF COMPLAINT:  Chief Complaint  Patient presents with   Procedure    Rm 2, alone. Here for Botox.    Botulinum Toxin Injection    RM 2. Office stock botox [id number]. Lot: [id number], expiration: 02/2024. [facility]: [phone]    HISTORY OF PRESENT ILLNESS:  Pamela Murphy is a 51 y.o. woman with a chronic migraine disorder, obstructive sleep apnea and abnormal MRI of the brain.   Update 01/26/2022 Migraine headaches have done very well since her last Botox injections 3 months ago.  She had only a couple despite more stress with her health (colon cancer).     When migraine occurs she will take indomethacin or a triptan and Percocet if pain does not improve.  Botox also has helped the neck pain some.  HA worse since atenolol d/c.   Having a lot of nausea today.     She has OSA and is on CPAP and tolerates it well.    Download last year showed 100% compliance with excellent efficacy (AHI of 0.8).  She sleeps better when she uses CPAP than when she does not.  She feels refreshed in the morning.  Her back pain is better since the RFA.    MRI of the lumbar spine has shown left greater than right facet hypertrophy at L4-L5 and L5-S1.  She has colon cancer and had a partial resection resection.   She will be starting oxiplatin and capectotabine.   No liver met's.   She has 2 small nodules in lung nit felt to be cancer.  .   She is having side effects (vasospasm) from the capectotabine.   She is seeing cardiology and her atenolol was d/c.   She had benefit for the migraines..  She is on isosorbide and Tiadylt.(Diltiazem ER).    She is hoping she can go back to atenolol now that she just had last cycle of chemotherapy.     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    She did just start menopause.   She takes tizanidine.  Initially it helped her sleep but  now even 2 pills has not helped much.      Trazodone had not helped 10-15 years ago.   She takes gabapentin 300 mgat night as well.    She has RLS, worse on the left.      Migraine history:  She has a long history of chronic migraines that were occurring almost every day (25-28 days/month) for 4 or more hours a day. Multiple medications were tried for prophylactic and abortive therapies with suboptimal response.    In the past, she has failed multiple medications including:  Antiepileptics (Topamax, Keppra, zonisamide), anti-depressants (Cymbalta, Prozac), multiple NSAID's, betas blockers (Inderal, atenolol), muscle relaxants (soma, tizanidine, cyclobenzaprine) in various combinations.   Once the headache occurs, she has tried triptan's with incomplete benefit much of the time. She has also tried opiates.   IM Toradol has been most effective when one occurs.  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   I "undiagnosed her" around 2010 and she got a second opinion from Dr. Terie Purser who also concurred.  MRI shows small round foci predominantly in the subcortical and deep white matter in a nonspecific manner.  Her last MRI of the brain was in 2016 and  was stable compared to the previous ones.  She had had paresthesias and fatigue in the past that contributed to her diagnosis.   We have discussed that she has been stable times many years off therapy that MS is significantly less likely.   REVIEW OF SYSTEMS:  Constitutional: No fevers, chills, sweats, or change in appetite Eyes: No visual changes, double vision, eye pain Ear, nose and throat: No hearing loss, ear pain, nasal congestion, sore throat Cardiovascular: No chest pain, palpitations Respiratory:  see above.  She has OSA. GastrointestinaI: No nausea, vomiting, diarrhea, abdominal pain, fecal incontinence Genitourinary:  No dysuria, urinary retention or frequency.  No nocturia.    Dysfunctional uterine bleeding Musculoskeletal:  notes low back pain that has been worse the last couple months and neck pain that is helped by Botox Integumentary: No rash, pruritus, skin lesions Neurological: as above Psychiatric: No depression at this time.  No anxiety Endocrine: No palpitations, diaphoresis, change in appetite, change in weigh or increased thirst Hematologic/Lymphatic:   She has anemia due to dysfunctional uterine bleeding. Allergic/Immunologic: No itchy/runny eyes, nasal congestion, recent allergic reactions, rashes  ALLERGIES: Allergies  Allergen Reactions   Cefaclor Swelling and Rash     neck swelling   Prochlorperazine Maleate Rash and Other (See Comments)     jittery;tachycardia   Bupropion Anxiety   Sulfamethoxazole-Trimethoprim Nausea Only   Tessalon Perles [Benzonatate] Rash    HOME MEDICATIONS: Outpatient Medications Prior to Visit  Medication Sig Dispense Refill   atenolol (TENORMIN) 25 MG tablet TAKE 1 TABLET BY MOUTH EVERY DAY 90 tablet 3   Botulinum Toxin Type A (BOTOX) 200 units SOLR INJECT INTO THE MUSCLES BY PHYSICIAN EVERY 3 MONTHS FOR MIGRAINE HEADACHES. DISCARD UNUSED PORTION. 1 each 3   CAPECITABINE PO Take by mouth. Will start 10/09/21     famotidine (PEPCID) 40 MG tablet Take 40 mg by mouth 2 (two) times daily.     gabapentin (NEURONTIN) 300 MG capsule Take 1-2 capsules at bedtime 60 capsule 11   indomethacin (INDOCIN) 25 MG capsule TAKE 1 CAPSULE (25 MG TOTAL) BY MOUTH 2 (TWO) TIMES DAILY WITH A MEAL. 60 capsule 3   isosorbide mononitrate (IMDUR) 30 MG 24 hr tablet Take 30 mg by mouth daily.     levothyroxine (SYNTHROID) 25 MCG tablet Take 25 mcg by mouth daily before breakfast.     Multiple Vitamins-Minerals (MULTIVITAMIN WITH MINERALS) tablet Take by mouth.     ondansetron (ZOFRAN-ODT) 8 MG disintegrating tablet Take 1 tablet (8 mg total) by mouth every 8 (eight) hours as needed for nausea or vomiting. 30 tablet 2   OXALIPLATIN IV Inject  into the vein. Will start 10/09/21     oxyCODONE-acetaminophen (PERCOCET) 10-325 MG tablet Take 1 tablet by mouth every 8 (eight) hours as needed for pain. 90 tablet 0   PNEUMOCOCCAL VAC POLYVALENT IJ Inject as directed. Received in January 2021     rizatriptan (MAXALT-MLT) 10 MG disintegrating tablet TAKE 1 TABLET BY MOUTH AS NEEDED FOR MIGRAINE. MAY REPEAT IN 2 HOURS IF NEEDED 12 tablet 11   TIADYLT ER 300 MG 24 hr capsule Take 2 capsules by mouth daily.     tiZANidine (ZANAFLEX) 4 MG tablet Take 1 tablet (4 mg total) by mouth 3 (three) times daily. 90 tablet 11   pantoprazole (PROTONIX) 40 MG tablet Take 40 mg by mouth 2 (two) times daily.      JARDIANCE 10 MG TABS tablet Take 10 mg by mouth daily. (Patient not taking: Reported  on 09/25/2021)     No facility-administered medications prior to visit.    PAST MEDICAL HISTORY: Past Medical History:  Diagnosis Date   Headache    Hypertension    Type 2 diabetes mellitus ([place]) 06/08/2018   Vision abnormalities     PAST SURGICAL HISTORY: Past Surgical History:  Procedure Laterality Date   SINUS IRRIGATION      FAMILY HISTORY: Family History  Problem Relation Age of Onset   Breast cancer Mother    Melanoma Mother    Atrial fibrillation Mother    Supraventricular tachycardia Mother    Heart attack Father    Hypertension Father    Hyperlipidemia Father     SOCIAL HISTORY:  Social History   Socioeconomic History   Marital status: Married    Spouse name: Not on file   Number of children: Not on file   Years of education: Not on file   Highest education level: Not on file  Occupational History    Comment: full time  Tobacco Use   Smoking status: Never   Smokeless tobacco: Never  Substance and Sexual Activity   Alcohol use: No    Alcohol/week: 0.0 standard drinks    Comment: Rare   Drug use: No   Sexual activity: Not on file  Other Topics Concern   Not on file  Social History Narrative   Lives with spouse   Right  Handed   Drinks 1-2 cups caffeine daily   Social Determinants of Health   Financial Resource Strain: Not on file  Food Insecurity: Not on file  Transportation Needs: Not on file  Physical Activity: Not on file  Stress: Not on file  Social Connections: Not on file  Intimate Partner Violence: Not on file     PHYSICAL EXAM  There were no vitals filed for this visit.    There is no height or weight on file to calculate BMI.   General: The patient is well-developed and well-nourished and in no acute distress   Musculoskeletal:   Her neck has a good ROM. She has no neck pain.     Neurologic Exam  Mental status: The patient is alert and oriented x 3 at the time of the examination.  Speech is normal.  Cranial nerves: Extraocular movements are full.  Facial strength and sensation was normal.  Trapezius strength was normal.  No obvious hearing deficits are noted.  Motor:   Muscle tone and bulk is normal. She has normal strength.    Sensory: Intact sensation to touch and vibration in the arms or legs.  Gait and station: Station and gait are normal.          ASSESSMENT AND PLAN    1. Chronic migraine w/o aura, not intractable, w/o stat migr   2. Facet hypertrophy of lumbar region   3. Obstructive sleep apnea   4. Malignant neoplasm of colon, unspecified part of colon ([place])   5. Neck pain   6. Nausea       1.   Inject 178 units Botox (22 units wasted) :   Frontalis (2.5 U x 4), nasalis/corrugators (2.5 U x 3), temporalis  (5 U x 10 ), occipitalis (5 U x 6), splenius capitis (15U x 2), trapezius (15 U x 2),   C6C7 paraspinal muscles (10 U x 2),  2.   Migraines have increased associated with changes in medication required for her chemotherapy.  Hopefully she will be able to switch back to atenolol.  In  the meantime, I will have her take an anti-CGRP injection this month and next month (A[place]ovy).    Because she has some gastritis she will hold off on indomethacin when the  headache occurs.  She will also take Maxalt and/or Percocet if a more severe migraine occurs. 3.   Continue to use CPAP nightly for OSA.  4.   If back pain worsens again we can set up radiofrequency ablation.  Continue gabapentin to 600 mg at night and can continue tizanidine 5.   For nausea, Zofran 8 mg po x 1.       Return to see me in about 3 months for the next Botox injection, or sooner if she has new or worsening neurologic symptoms.   30-minute office visit with the majority of the time spent face-to-face for history and physical, discussion/counseling and decision-making.  Additional time with record review and documentation.   Kendra Woolford A. Felecia Shelling, MD, PhD 05/26/8269, 7:86 PM Certified in Neurology, Clinical Neurophysiology, Sleep Medicine, Pain Medicine and Neuroimaging  [facility] [address], [place], [place] [address] [phone]

## 2022-01-28 NOTE — Unmapped (Signed)
Called and spoke to patient. Currently taking capecitabine, has about 2 days left of prescription. Patient reports that she has lost about 15 pounds this cycle. Has ongoing nausea, decreased appetite, and feels miserable. She was hopeful that signatera results would come back today. They are still pending. She would like to stop the capecitabine but wanted to speak with oncology team first. Advised patient that Dr. Rozell Searing was aware of her side effects and concerns and okay with her stopping capecitabine. Patient verbalized understanding and expressed appreciation for the call. MyChart message sent as follow up.

## 2022-01-29 NOTE — Unmapped (Signed)
Specialty Medication(s): capecitabine    Ms. has been dis-enrolled from the [facility] specialty pharmacy services due to medication discontinuation resulting from side effect intolerance. (Per epic notes/encounter from 01/28/22)    Additional information provided to the patient: NA    Nina Mitchell A Shari Heritage [facility] Specialty Pharmacist

## 2022-01-31 MED ORDER — FAMOTIDINE 40 MG TABLET
ORAL_TABLET | 1 refills | 0 days
Start: 2022-01-31 — End: ?

## 2022-02-02 MED ORDER — FAMOTIDINE 40 MG TABLET
ORAL_TABLET | 1 refills | 0 days | Status: CP
Start: 2022-02-02 — End: ?

## 2022-02-06 ENCOUNTER — Ambulatory Visit: Admit: 2022-02-06 | Discharge: 2022-02-06 | Payer: PRIVATE HEALTH INSURANCE

## 2022-02-06 ENCOUNTER — Other Ambulatory Visit: Admit: 2022-02-06 | Discharge: 2022-02-06 | Payer: PRIVATE HEALTH INSURANCE

## 2022-02-06 DIAGNOSIS — I517 Cardiomegaly: Principal | ICD-10-CM

## 2022-02-06 DIAGNOSIS — I10 Essential (primary) hypertension: Principal | ICD-10-CM

## 2022-02-06 DIAGNOSIS — C187 Malignant neoplasm of sigmoid colon: Principal | ICD-10-CM

## 2022-02-06 DIAGNOSIS — R911 Solitary pulmonary nodule: Principal | ICD-10-CM

## 2022-02-06 DIAGNOSIS — I739 Peripheral vascular disease, unspecified: Principal | ICD-10-CM

## 2022-02-06 DIAGNOSIS — I201 Angina pectoris with documented spasm: Principal | ICD-10-CM

## 2022-02-06 MED ORDER — DILTIAZEM ER 300 MG CAPSULE,24 HR,EXTENDED RELEASE
ORAL_CAPSULE | Freq: Every day | ORAL | 1 refills | 60 days | Status: CP
Start: 2022-02-06 — End: 2022-04-07

## 2022-02-14 DIAGNOSIS — I739 Peripheral vascular disease, unspecified: Principal | ICD-10-CM

## 2022-02-14 MED ORDER — TIADYLT ER 300 MG CAPSULE,EXTENDED RELEASE
ORAL_CAPSULE | 1 refills | 0 days
Start: 2022-02-14 — End: ?

## 2022-02-14 MED ORDER — OLANZAPINE 5 MG TABLET
ORAL_TABLET | 1 refills | 0 days
Start: 2022-02-14 — End: ?

## 2022-02-16 MED ORDER — OLANZAPINE 5 MG TABLET
ORAL_TABLET | 1 refills | 0 days
Start: 2022-02-16 — End: ?

## 2022-02-16 MED ORDER — TIADYLT ER 300 MG CAPSULE,EXTENDED RELEASE
ORAL_CAPSULE | 1 refills | 0 days
Start: 2022-02-16 — End: ?

## 2022-02-26 ENCOUNTER — Encounter: Payer: Self-pay | Admitting: Neurology

## 2022-02-26 ENCOUNTER — Other Ambulatory Visit: Payer: Self-pay | Admitting: Neurology

## 2022-02-26 ENCOUNTER — Other Ambulatory Visit: Payer: Self-pay | Admitting: *Deleted

## 2022-02-26 DIAGNOSIS — M47816 Spondylosis without myelopathy or radiculopathy, lumbar region: Secondary | ICD-10-CM

## 2022-03-04 ENCOUNTER — Telehealth: Payer: Self-pay | Admitting: Neurology

## 2022-03-04 MED ORDER — OXYCODONE-ACETAMINOPHEN 10-325 MG PO TABS: 1.0000 | ORAL_TABLET | Freq: Three times a day (TID) | ORAL | 0 refills | Status: DC | PRN

## 2022-03-04 NOTE — Telephone Encounter (Signed)
Cathy with [facility] called and is wanting some clarification. She said the DG order is put in for middle branch block but when they spoke with the patient she said she always gets a RFA.Marland Kitchen the patient is scheduled for tomorrow- if you would like her to have the RFA they informed me they would have to probably r/s the patient because they would have to do the PA all over again. Please advice.  ?

## 2022-03-04 NOTE — Addendum Note (Signed)
Addended by: Verlin Grills on: 03/04/2022 01:08 PM ? ? Modules accepted: Orders ? ?

## 2022-03-05 ENCOUNTER — Inpatient Hospital Stay: Admission: RE | Admit: 2022-03-05 | Payer: BC Managed Care – PPO | Source: Ambulatory Visit

## 2022-03-19 ENCOUNTER — Telehealth: Payer: Self-pay | Admitting: Neurology

## 2022-03-19 MED ORDER — BOTOX 200 UNITS IJ SOLR: INTRAMUSCULAR | 3 refills | Status: DC

## 2022-03-19 NOTE — Telephone Encounter (Addendum)
Please send Botox Rx to [facility]. ?

## 2022-03-19 NOTE — Telephone Encounter (Signed)
Rx sent 

## 2022-03-19 NOTE — Addendum Note (Signed)
Addended by: Georgiann Cocker on: 03/19/2022 04:28 PM ? ? Modules accepted: Orders ? ?

## 2022-03-23 ENCOUNTER — Ambulatory Visit: Payer: BC Managed Care – PPO | Admitting: Neurology

## 2022-03-25 ENCOUNTER — Other Ambulatory Visit: Payer: Self-pay | Admitting: Neurology

## 2022-03-25 ENCOUNTER — Ambulatory Visit
Admission: RE | Admit: 2022-03-25 | Discharge: 2022-03-25 | Disposition: A | Discharge status: Home / Self Care | Payer: BC Managed Care – PPO | Source: Ambulatory Visit | Attending: Neurology | Admitting: Neurology

## 2022-03-25 DIAGNOSIS — M47816 Spondylosis without myelopathy or radiculopathy, lumbar region: Secondary | ICD-10-CM

## 2022-03-25 MED ORDER — KETOROLAC TROMETHAMINE 30 MG/ML IJ SOLN
30.0000 mg | Freq: Once | INTRAMUSCULAR | Status: AC
  Administered 2022-03-25: 30 mg via INTRAVENOUS

## 2022-03-25 MED ORDER — SODIUM CHLORIDE 0.9 % IV SOLN: INTRAVENOUS | Status: DC

## 2022-03-25 MED ORDER — ONDANSETRON HCL 4 MG/2ML IJ SOLN: 4.0000 mg | Freq: Once | INTRAMUSCULAR | Status: DC

## 2022-03-25 MED ORDER — FENTANYL CITRATE PF 50 MCG/ML IJ SOSY
25.0000 ug | PREFILLED_SYRINGE | INTRAMUSCULAR | Status: DC | PRN
  Administered 2022-03-25 (×2): 50 ug via INTRAVENOUS

## 2022-03-25 MED ORDER — METHYLPREDNISOLONE ACETATE 40 MG/ML INJ SUSP (RADIOLOG
80.0000 mg | Freq: Once | INTRAMUSCULAR | Status: AC
  Administered 2022-03-25: 80 mg via INTRA_ARTICULAR

## 2022-03-25 MED ORDER — MIDAZOLAM HCL 2 MG/2ML IJ SOLN
1.0000 mg | INTRAMUSCULAR | Status: DC | PRN
  Administered 2022-03-25 (×3): 1 mg via INTRAVENOUS
  Administered 2022-03-25: 0.5 mg via INTRAVENOUS

## 2022-03-25 NOTE — Discharge Instructions (Signed)
Radio Frequency Ablation Post Procedure Discharge Instructions ? ?May resume a regular diet and any medications that you routinely take (including pain medications). ?No driving day of procedure. ?Upon discharge go home and rest for at least 4 hours.  May use an ice pack as needed to injection sites on back. ?Remove bandades later, today. ? ? ? ?Please contact our office at [phone] for the following symptoms: ? ?Fever greater than 100 degrees ?Increased swelling, pain, or redness at injection site. ? ? ?Thank you for visiting [facility].  ?

## 2022-03-25 NOTE — Progress Notes (Signed)
Pt back in nursing recovery area. Pt awake and alert post procedure, oriented x4. Pt follows commands, talks in complete sentences and has no complaints at this time. Pt will remain in nursing station until discharged by Radiologist.  ?

## 2022-04-16 NOTE — Telephone Encounter (Signed)
Received (1) 200 unit vial of Botox from [facility].

## 2022-04-21 ENCOUNTER — Ambulatory Visit (INDEPENDENT_AMBULATORY_CARE_PROVIDER_SITE_OTHER): Payer: BC Managed Care – PPO | Admitting: Neurology

## 2022-04-21 ENCOUNTER — Encounter: Payer: Self-pay | Admitting: Neurology

## 2022-04-21 DIAGNOSIS — G4733 Obstructive sleep apnea (adult) (pediatric): Secondary | ICD-10-CM | POA: Diagnosis not present

## 2022-04-21 DIAGNOSIS — M47816 Spondylosis without myelopathy or radiculopathy, lumbar region: Secondary | ICD-10-CM | POA: Diagnosis not present

## 2022-04-21 DIAGNOSIS — G43709 Chronic migraine without aura, not intractable, without status migrainosus: Secondary | ICD-10-CM

## 2022-04-21 DIAGNOSIS — C189 Malignant neoplasm of colon, unspecified: Secondary | ICD-10-CM

## 2022-04-21 DIAGNOSIS — R200 Anesthesia of skin: Secondary | ICD-10-CM

## 2022-04-21 DIAGNOSIS — R202 Paresthesia of skin: Secondary | ICD-10-CM

## 2022-04-21 MED ORDER — ATENOLOL 25 MG PO TABS: 25.0000 mg | ORAL_TABLET | Freq: Every day | ORAL | 3 refills | Status: DC

## 2022-04-21 NOTE — Progress Notes (Signed)
Botox- 200 units x 1 vial Lot: [id number] Expiration: 10/2024 [facility]: 0023-3921-02  Bacteriostatic 0.9% Sodium Chloride- 4mL total Lot: [id number] Expiration: 06/24/2023 [facility]: 0409-1966-02  Dx: G43.709 S/P    

## 2022-04-21 NOTE — Progress Notes (Signed)
[facility]  PATIENT: Pamela Murphy DOB: [birth year]  REASON FOR VISIT: Chronic Migraine headaches   HISTORICAL  CHIEF COMPLAINT:  Chief Complaint  Patient presents with   Procedure    Rm 2, alone. Here for Botox.     HISTORY OF PRESENT ILLNESS:  Pamela Murphy is a 51 y.o. woman with a chronic migraine disorder, obstructive sleep apnea and abnormal MRI of the brain.   Update 04/21/2022 She reports that the migraine headaches continues to do well on Botox injections every 3 months.  Even with stress colon cancer, surgery and chemotherapy headaches are doing much better than they did a couple years ago..    When migraine occurs she  take indomethacin or a triptan and Percocet if pain does not improve.  Due to some gastritis and her recent GI issues she has stopped taking the as needed indomethacin recently.  Botox also has helped the neck pain some.  HA worse since atenolol d/c.   Having a lot of nausea today.  Getting chemotherapy she did have some numbness in her feet that has since improved.  However, more recently numbness developed in the left medial plantar region.  There is no weakness or pain noted.     She has OSA and is on CPAP and tolerates it well.    Download last year showed 100% compliance with excellent efficacy (AHI of 0.8).  She sleeps better when she uses CPAP than when she does not.  She feels refreshed in the morning.  Her back pain is better since the RFA.  The right side was done recently and we discussed having the left side repeated.  MRI of the lumbar spine has shown left greater than right facet hypertrophy at L4-L5 and L5-S1.  She has colon cancer and had a partial resection resection.   She will be starting oxiplatin and capectotabine.   No liver met's.   She has 2 small nodules in lung nit felt to be cancer.  .   She is having side effects (vasospasm) from the capectotabine.   She is back on atenolol instead of diltiazem.  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    She did just start menopause.   She takes tizanidine.  Initially it helped her sleep but now even 2 pills has not helped much.      Trazodone had not helped 10-15 years ago.   She takes gabapentin 300 mgat night as well.    She has RLS, worse on the left.      Migraine history:  She has a long history of chronic migraines that were occurring almost every day (25-28 days/month) for 4 or more hours a day. Multiple medications were tried for prophylactic and abortive therapies with suboptimal response.    In the past, she has failed multiple medications including:  Antiepileptics (Topamax, Keppra, zonisamide), anti-depressants (Cymbalta, Prozac), multiple NSAID's, betas blockers (Inderal, atenolol), muscle relaxants (soma, tizanidine, cyclobenzaprine) in various combinations.   Once the headache occurs, she has tried triptan's with incomplete benefit much of the time. She has also tried opiates.   IM Toradol has been most effective when one occurs.  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   I "undiagnosed her" around 2010 and she got a second opinion from Dr. Terie Purser who also concurred.  MRI shows small round foci predominantly in the subcortical and deep white matter in  a nonspecific manner.  Her last MRI of the brain was in 2016 and was stable compared to the previous ones.  She had had paresthesias and fatigue in the past that contributed to her diagnosis.   We have discussed that she has been stable times many years off therapy that MS is significantly less likely.   REVIEW OF SYSTEMS:  Constitutional: No fevers, chills, sweats, or change in appetite Eyes: No visual changes, double vision, eye pain Ear, nose and throat: No hearing loss, ear pain, nasal congestion, sore throat Cardiovascular: No chest pain, palpitations Respiratory:  see above.  She has OSA. GastrointestinaI: No nausea,  vomiting, diarrhea, abdominal pain, fecal incontinence Genitourinary:  No dysuria, urinary retention or frequency.  No nocturia.   Dysfunctional uterine bleeding Musculoskeletal:  notes low back pain that has been worse the last couple months and neck pain that is helped by Botox Integumentary: No rash, pruritus, skin lesions Neurological: as above Psychiatric: No depression at this time.  No anxiety Endocrine: No palpitations, diaphoresis, change in appetite, change in weigh or increased thirst Hematologic/Lymphatic:   She has anemia due to dysfunctional uterine bleeding. Allergic/Immunologic: No itchy/runny eyes, nasal congestion, recent allergic reactions, rashes  ALLERGIES: Allergies  Allergen Reactions   Cefaclor Swelling and Rash     neck swelling   Prochlorperazine Maleate Rash and Other (See Comments)     jittery;tachycardia   Bupropion Anxiety   Sulfamethoxazole-Trimethoprim Nausea Only   Tessalon Perles [Benzonatate] Rash    HOME MEDICATIONS: Outpatient Medications Prior to Visit  Medication Sig Dispense Refill   Botulinum Toxin Type A (BOTOX) 200 units SOLR INJECT INTO THE MUSCLES BY PHYSICIAN EVERY 3 MONTHS FOR MIGRAINE HEADACHES. DISCARD UNUSED PORTION. 1 each 3   CAPECITABINE PO Take by mouth. Will start 10/09/21     famotidine (PEPCID) 40 MG tablet Take 40 mg by mouth 2 (two) times daily.     gabapentin (NEURONTIN) 300 MG capsule Take 1-2 capsules at bedtime 60 capsule 11   indomethacin (INDOCIN) 25 MG capsule TAKE 1 CAPSULE (25 MG TOTAL) BY MOUTH 2 (TWO) TIMES DAILY WITH A MEAL. 60 capsule 3   isosorbide mononitrate (IMDUR) 30 MG 24 hr tablet Take 30 mg by mouth daily.     levothyroxine (SYNTHROID) 25 MCG tablet Take 25 mcg by mouth daily before breakfast.     Multiple Vitamins-Minerals (MULTIVITAMIN WITH MINERALS) tablet Take by mouth.     ondansetron (ZOFRAN-ODT) 8 MG disintegrating tablet Take 1 tablet (8 mg total) by mouth every 8 (eight) hours as needed for  nausea or vomiting. 30 tablet 2   OXALIPLATIN IV Inject into the vein. Will start 10/09/21     oxyCODONE-acetaminophen (PERCOCET) 10-325 MG tablet Take 1 tablet by mouth every 8 (eight) hours as needed for pain. 90 tablet 0   pantoprazole (PROTONIX) 40 MG tablet Take 40 mg by mouth 2 (two) times daily.      PNEUMOCOCCAL VAC POLYVALENT IJ Inject as directed. Received in January 2021     rizatriptan (MAXALT-MLT) 10 MG disintegrating tablet TAKE 1 TABLET BY MOUTH AS NEEDED FOR MIGRAINE. MAY REPEAT IN 2 HOURS IF NEEDED 12 tablet 11   TIADYLT ER 300 MG 24 hr capsule Take 2 capsules by mouth daily.     tiZANidine (ZANAFLEX) 4 MG tablet Take 1 tablet (4 mg total) by mouth 3 (three) times daily. 90 tablet 11   atenolol (TENORMIN) 25 MG tablet TAKE 1 TABLET BY MOUTH EVERY DAY 90 tablet 3   No  facility-administered medications prior to visit.    PAST MEDICAL HISTORY: Past Medical History:  Diagnosis Date   Headache    Hypertension    Type 2 diabetes mellitus ([place]) 06/08/2018   Vision abnormalities     PAST SURGICAL HISTORY: Past Surgical History:  Procedure Laterality Date   SINUS IRRIGATION      FAMILY HISTORY: Family History  Problem Relation Age of Onset   Breast cancer Mother    Melanoma Mother    Atrial fibrillation Mother    Supraventricular tachycardia Mother    Heart attack Father    Hypertension Father    Hyperlipidemia Father     SOCIAL HISTORY:  Social History   Socioeconomic History   Marital status: Married    Spouse name: Not on file   Number of children: Not on file   Years of education: Not on file   Highest education level: Not on file  Occupational History    Comment: full time  Tobacco Use   Smoking status: Never   Smokeless tobacco: Never  Substance and Sexual Activity   Alcohol use: No    Alcohol/week: 0.0 standard drinks    Comment: Rare   Drug use: No   Sexual activity: Not on file  Other Topics Concern   Not on file  Social History Narrative    Lives with spouse   Right Handed   Drinks 1-2 cups caffeine daily   Social Determinants of Health   Financial Resource Strain: Not on file  Food Insecurity: Not on file  Transportation Needs: Not on file  Physical Activity: Not on file  Stress: Not on file  Social Connections: Not on file  Intimate Partner Violence: Not on file     PHYSICAL EXAM  There were no vitals filed for this visit.    There is no height or weight on file to calculate BMI.   General: The patient is well-developed and well-nourished and in no acute distress   Musculoskeletal:   Her neck has a good ROM. She has no neck pain.   Shoulders have good range of motion.  Neurologic Exam  Mental status: The patient is alert and oriented x 3 at the time of the examination.  Speech is normal.  Cranial nerves: Extraocular movements are full.  Facial strength and sensation was normal.  Trapezius strength was normal.  No obvious hearing deficits are noted.  Motor:   Muscle tone and bulk is normal. She has normal strength.    Sensory: She has numbness in the left medial plantar distribution.  There is no tenderness over the tarsal tunnel.  The lateral plantar and calcaneal distribution is normal.  Intact sensation to touch and vibration in the arms.  Gait and station: Station and gait are normal.     Deep tendon reflexes are normal and symmetric in the legs.       ASSESSMENT AND PLAN    1. Chronic migraine w/o aura, not intractable, w/o stat migr   2. Facet hypertrophy of lumbar region   3. Obstructive sleep apnea   4. Malignant neoplasm of colon, unspecified part of colon ([place])   5. Numbness and tingling of foot        1.   Inject 178 units Botox (22 units wasted) :   Frontalis (2.5 U x 4), nasalis/corrugators (2.5 U x 3), temporalis  (5 U x 10 ), occipitalis (5 U x 6), splenius capitis (15U x 2), trapezius (15 U x 2),   C6C7  paraspinal muscles (10 U x 2),  2.   If migraines worsen again, she  will resume an anti-CGRP injection.  Had done well with Ajovy).      She will also take Maxalt and/or Percocet if a more severe migraine occurs. 3.   Continue to use CPAP nightly for OSA.  4.   Set up radiofrequency ablation on the left (L4L5 and L5S1)  Continue gabapentin to 600 mg at night and can continue tizanidine 5. Return to see me in about 3 months for the next Botox injection, or sooner if she has new or worsening neurologic symptoms.   35-minute office visit with the majority of the time spent face-to-face for history and physical, discussion/counseling and decision-making.  Additional time with record review and documentation.   Gaige Fussner A. Felecia Shelling, MD, PhD 0/35/5974, 1:63 PM Certified in Neurology, Clinical Neurophysiology, Sleep Medicine, Pain Medicine and Neuroimaging  [facility] [address] [place], [place] [address] [phone]

## 2022-05-04 DIAGNOSIS — R7309 Other abnormal glucose: Principal | ICD-10-CM

## 2022-05-04 DIAGNOSIS — C187 Malignant neoplasm of sigmoid colon: Principal | ICD-10-CM

## 2022-05-04 MED ORDER — CLONAZEPAM 0.5 MG TABLET
ORAL_TABLET | Freq: Two times a day (BID) | ORAL | 0 refills | 15 days | Status: CP | PRN
Start: 2022-05-04 — End: ?

## 2022-05-07 DIAGNOSIS — E119 Type 2 diabetes mellitus without complications: Principal | ICD-10-CM

## 2022-05-11 ENCOUNTER — Ambulatory Visit
Admission: RE | Admit: 2022-05-11 | Discharge: 2022-05-11 | Disposition: A | Discharge status: Home / Self Care | Payer: BC Managed Care – PPO | Source: Ambulatory Visit | Attending: Neurology | Admitting: Neurology

## 2022-05-11 ENCOUNTER — Other Ambulatory Visit: Payer: Self-pay | Admitting: Neurology

## 2022-05-11 DIAGNOSIS — M47816 Spondylosis without myelopathy or radiculopathy, lumbar region: Secondary | ICD-10-CM

## 2022-05-11 MED ORDER — SODIUM CHLORIDE 0.9 % IV SOLN: INTRAVENOUS | Status: DC

## 2022-05-11 MED ORDER — ONDANSETRON HCL 4 MG/2ML IJ SOLN: 4.0000 mg | Freq: Once | INTRAMUSCULAR | Status: DC

## 2022-05-11 MED ORDER — MIDAZOLAM HCL 2 MG/2ML IJ SOLN
1.0000 mg | INTRAMUSCULAR | Status: DC | PRN
  Administered 2022-05-11 (×3): 1 mg via INTRAVENOUS

## 2022-05-11 MED ORDER — FENTANYL CITRATE PF 50 MCG/ML IJ SOSY
25.0000 ug | PREFILLED_SYRINGE | INTRAMUSCULAR | Status: DC | PRN
  Administered 2022-05-11 (×2): 50 ug via INTRAVENOUS

## 2022-05-11 MED ORDER — KETOROLAC TROMETHAMINE 30 MG/ML IJ SOLN
30.0000 mg | Freq: Once | INTRAMUSCULAR | Status: AC
  Administered 2022-05-11: 30 mg via INTRAVENOUS

## 2022-05-11 NOTE — Progress Notes (Signed)
Pt back in nursing recovery area. Pt awake and alert, oriented x4. Pt follows commands, talks in complete sentences and has no complaints at this time. Pt will remain in nurses station until discharged by Radiologist.

## 2022-05-11 NOTE — Discharge Instructions (Signed)
Radiofrequency Ablation Discharge Instructions ° °1. After your radiofrequency ablation use ice to the affected area for the next 24 hours, as a temporary increase in pain is not uncommon for a day or two after your procedure. ° °2. Resume all medications. ° °3. Follow up with your ordering physician for post care. ° °4. If you have any of the following please call [phone]: ° °    Temperature greater than 101 °    Pain, redness or swelling at the injection site °

## 2022-05-15 ENCOUNTER — Other Ambulatory Visit: Admit: 2022-05-15 | Discharge: 2022-05-16 | Payer: PRIVATE HEALTH INSURANCE

## 2022-05-15 ENCOUNTER — Ambulatory Visit: Admit: 2022-05-15 | Discharge: 2022-05-16 | Payer: PRIVATE HEALTH INSURANCE

## 2022-05-15 DIAGNOSIS — E119 Type 2 diabetes mellitus without complications: Principal | ICD-10-CM

## 2022-05-15 DIAGNOSIS — C187 Malignant neoplasm of sigmoid colon: Principal | ICD-10-CM

## 2022-05-17 ENCOUNTER — Other Ambulatory Visit: Payer: Self-pay | Admitting: Neurology

## 2022-05-20 DIAGNOSIS — G5790 Unspecified mononeuropathy of unspecified lower limb: Principal | ICD-10-CM

## 2022-06-01 DIAGNOSIS — Z1231 Encounter for screening mammogram for malignant neoplasm of breast: Principal | ICD-10-CM

## 2022-06-05 ENCOUNTER — Ambulatory Visit
Admit: 2022-06-05 | Discharge: 2022-06-06 | Payer: PRIVATE HEALTH INSURANCE | Attending: Physical Medicine & Rehabilitation | Primary: Physical Medicine & Rehabilitation

## 2022-06-05 DIAGNOSIS — Z7182 Exercise counseling: Principal | ICD-10-CM

## 2022-06-05 DIAGNOSIS — R530 Neoplastic (malignant) related fatigue: Principal | ICD-10-CM

## 2022-06-05 DIAGNOSIS — R292 Abnormal reflex: Principal | ICD-10-CM

## 2022-06-05 DIAGNOSIS — E1142 Type 2 diabetes mellitus with diabetic polyneuropathy: Principal | ICD-10-CM

## 2022-06-05 DIAGNOSIS — G620 Drug-induced polyneuropathy: Principal | ICD-10-CM

## 2022-06-05 DIAGNOSIS — T451X5A Adverse effect of antineoplastic and immunosuppressive drugs, initial encounter: Principal | ICD-10-CM

## 2022-06-11 ENCOUNTER — Ambulatory Visit: Admit: 2022-06-11 | Discharge: 2022-06-12 | Payer: PRIVATE HEALTH INSURANCE

## 2022-06-15 ENCOUNTER — Telehealth: Admit: 2022-06-15 | Discharge: 2022-06-16 | Payer: PRIVATE HEALTH INSURANCE

## 2022-06-15 ENCOUNTER — Other Ambulatory Visit: Payer: Self-pay | Admitting: Neurology

## 2022-06-15 DIAGNOSIS — F411 Generalized anxiety disorder: Principal | ICD-10-CM

## 2022-06-15 DIAGNOSIS — C187 Malignant neoplasm of sigmoid colon: Principal | ICD-10-CM

## 2022-06-15 MED ORDER — DULOXETINE 30 MG CAPSULE,DELAYED RELEASE
ORAL_CAPSULE | 0 refills | 0 days | Status: CP
Start: 2022-06-15 — End: ?

## 2022-06-16 ENCOUNTER — Ambulatory Visit: Admit: 2022-06-16 | Discharge: 2022-06-17

## 2022-06-16 DIAGNOSIS — Z1231 Encounter for screening mammogram for malignant neoplasm of breast: Principal | ICD-10-CM

## 2022-06-23 MED ORDER — CLONAZEPAM 0.5 MG TABLET
ORAL_TABLET | Freq: Two times a day (BID) | ORAL | 0 refills | 23 days | Status: CP | PRN
Start: 2022-06-23 — End: ?

## 2022-07-04 ENCOUNTER — Encounter: Payer: Self-pay | Admitting: Neurology

## 2022-07-06 ENCOUNTER — Other Ambulatory Visit: Payer: Self-pay | Admitting: Neurology

## 2022-07-06 DIAGNOSIS — M542 Cervicalgia: Secondary | ICD-10-CM

## 2022-07-06 DIAGNOSIS — R292 Abnormal reflex: Secondary | ICD-10-CM

## 2022-07-06 DIAGNOSIS — R9089 Other abnormal findings on diagnostic imaging of central nervous system: Secondary | ICD-10-CM

## 2022-07-07 DIAGNOSIS — F411 Generalized anxiety disorder: Principal | ICD-10-CM

## 2022-07-07 MED ORDER — DULOXETINE 30 MG CAPSULE,DELAYED RELEASE
ORAL_CAPSULE | 1 refills | 0 days
Start: 2022-07-07 — End: ?

## 2022-07-13 ENCOUNTER — Telehealth: Admit: 2022-07-13 | Discharge: 2022-07-14 | Payer: PRIVATE HEALTH INSURANCE

## 2022-07-13 ENCOUNTER — Telehealth: Payer: Self-pay | Admitting: Neurology

## 2022-07-13 DIAGNOSIS — F411 Generalized anxiety disorder: Principal | ICD-10-CM

## 2022-07-13 MED ORDER — DULOXETINE 30 MG CAPSULE,DELAYED RELEASE
ORAL_CAPSULE | 1 refills | 0 days
Start: 2022-07-13 — End: ?

## 2022-07-13 MED ORDER — DULOXETINE 60 MG CAPSULE,DELAYED RELEASE
ORAL_CAPSULE | Freq: Every day | ORAL | 3 refills | 90 days | Status: CP
Start: 2022-07-13 — End: 2023-07-13

## 2022-07-13 NOTE — Telephone Encounter (Signed)
Pt is scheduled for MRI cervical spine w/wo contrast at [facility] on 07/21/22 at 7:30 am.  Dillon Bjork: [phone] (07/13/22-08/11/22)

## 2022-07-14 ENCOUNTER — Ambulatory Visit: Payer: BC Managed Care – PPO | Admitting: Neurology

## 2022-07-16 ENCOUNTER — Encounter: Payer: Self-pay | Admitting: Neurology

## 2022-07-21 ENCOUNTER — Ambulatory Visit (INDEPENDENT_AMBULATORY_CARE_PROVIDER_SITE_OTHER): Payer: BC Managed Care – PPO

## 2022-07-21 DIAGNOSIS — R292 Abnormal reflex: Secondary | ICD-10-CM | POA: Diagnosis not present

## 2022-07-21 DIAGNOSIS — R9089 Other abnormal findings on diagnostic imaging of central nervous system: Secondary | ICD-10-CM

## 2022-07-21 DIAGNOSIS — M542 Cervicalgia: Secondary | ICD-10-CM | POA: Diagnosis not present

## 2022-07-21 MED ORDER — GADOBENATE DIMEGLUMINE 529 MG/ML IV SOLN
20.0000 mL | Freq: Once | INTRAVENOUS | Status: AC | PRN
  Administered 2022-07-21: 20 mL via INTRAVENOUS

## 2022-07-22 ENCOUNTER — Other Ambulatory Visit: Payer: Self-pay | Admitting: Neurology

## 2022-07-22 ENCOUNTER — Other Ambulatory Visit: Payer: Self-pay | Admitting: *Deleted

## 2022-07-28 ENCOUNTER — Encounter: Payer: Self-pay | Admitting: Neurology

## 2022-07-28 ENCOUNTER — Ambulatory Visit: Payer: BC Managed Care – PPO | Admitting: Neurology

## 2022-07-28 ENCOUNTER — Ambulatory Visit (INDEPENDENT_AMBULATORY_CARE_PROVIDER_SITE_OTHER): Payer: BC Managed Care – PPO | Admitting: Neurology

## 2022-07-28 VITALS — BP 169/113 | HR 76 | Ht 66.0 in | Wt 266.8 lb

## 2022-07-28 DIAGNOSIS — M542 Cervicalgia: Secondary | ICD-10-CM

## 2022-07-28 DIAGNOSIS — G43709 Chronic migraine without aura, not intractable, without status migrainosus: Secondary | ICD-10-CM

## 2022-07-28 DIAGNOSIS — C189 Malignant neoplasm of colon, unspecified: Secondary | ICD-10-CM | POA: Diagnosis not present

## 2022-07-28 DIAGNOSIS — G4733 Obstructive sleep apnea (adult) (pediatric): Secondary | ICD-10-CM

## 2022-07-28 DIAGNOSIS — R9089 Other abnormal findings on diagnostic imaging of central nervous system: Secondary | ICD-10-CM

## 2022-07-28 DIAGNOSIS — H532 Diplopia: Secondary | ICD-10-CM

## 2022-07-28 DIAGNOSIS — M47816 Spondylosis without myelopathy or radiculopathy, lumbar region: Secondary | ICD-10-CM

## 2022-07-28 MED ORDER — ONABOTULINUMTOXINA 200 UNITS IJ SOLR
178.0000 [IU] | Freq: Once | INTRAMUSCULAR | Status: AC
  Administered 2022-07-28: 178 [IU] via INTRAMUSCULAR

## 2022-07-28 MED ORDER — OXYCODONE-ACETAMINOPHEN 10-325 MG PO TABS: 1.0000 | ORAL_TABLET | Freq: Three times a day (TID) | ORAL | 0 refills | Status: DC | PRN

## 2022-07-28 NOTE — Progress Notes (Signed)
[facility]  PATIENT: Pamela Murphy DOB: 07/26/1971  REASON FOR VISIT: Chronic Migraine headaches   HISTORICAL  CHIEF COMPLAINT:  Chief Complaint  Patient presents with   Botulinum Toxin Injection    RM 1, alone.    HISTORY OF PRESENT ILLNESS:  Pamela Murphy is a 51 y.o. woman with a chronic migraine disorder, obstructive sleep apnea and abnormal MRI of the brain.   Update 07/28/2022 She has diplopia that started early this year, concurrent with chemo (oxiplatin and capectotabine).  It fluctuates quite a bit.   It is binocular and often worse on lateral gaze but can be present at primary gaze (esp with driving so often closes one eye)  She reports that the migraine headaches continues to do well on Botox injections every 3 months.  Even with stress colon cancer, surgery and chemotherapy headaches are doing much better than they did a couple years ago..    When migraine occurs she  takes nothing or a triptan and Percocet if pain does not improve.  Indomethacin caused GI issues,   Botox also has helped the neck pain some.  HA worse since atenolol d/c.   Having a lot of nausea today.  Getting chemotherapy she did have some numbness in her feet that has since improved.  However, she has persistent left lateral plantar region.  There is no weakness or pain noted.     She has OSA and is on CPAP and tolerates it well.    Download last year showed 100% compliance with excellent efficacy (AHI of 0.8).  She sleeps better when she uses CPAP than when she does not.  She feels refreshed in the morning.  Her back pain is better since the RFA.  MRI of the lumbar spine has shown left greater than right facet hypertrophy at L4-L5 and L5-S1.  She has colon cancer and had a partial resection resection.   She did oxiplatin and capectotabine.   No liver met's.   She has 2 small nodules in lung nit felt to be cancer.  .    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    She  did just start menopause.   She takes tizanidine.  Initially it helped her sleep but now even 2 pills has not helped much.      Trazodone had not helped 10-15 years ago.   She takes gabapentin 300 mgat night as well.    She has RLS, worse on the left.      Migraine history:  She has a long history of chronic migraines that were occurring almost every day (25-28 days/month) for 4 or more hours a day. Multiple medications were tried for prophylactic and abortive therapies with suboptimal response.    In the past, she has failed multiple medications including:  Antiepileptics (Topamax, Keppra, zonisamide), anti-depressants (Cymbalta, Prozac), multiple NSAID's, betas blockers (Inderal, atenolol), muscle relaxants (soma, tizanidine, cyclobenzaprine) in various combinations.   Once the headache occurs, she has tried triptan's with incomplete benefit much of the time. She has also tried opiates.   IM Toradol has been most effective when one occurs.  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   I "undiagnosed her" around 2010 and she got a second opinion from Dr. Terie Purser who also concurred.  MRI shows small round foci predominantly in the subcortical and deep white matter in a nonspecific manner.  Her last MRI  of the brain was in 2016 and was stable compared to the previous ones.  She had had paresthesias and fatigue in the past that contributed to her diagnosis.   We have discussed that she has been stable times many years off therapy that MS is significantly less likely.   REVIEW OF SYSTEMS:  Constitutional: No fevers, chills, sweats, or change in appetite Eyes: No visual changes, double vision, eye pain Ear, nose and throat: No hearing loss, ear pain, nasal congestion, sore throat Cardiovascular: No chest pain, palpitations Respiratory:  see above.  She has OSA. GastrointestinaI: No nausea, vomiting, diarrhea, abdominal pain, fecal  incontinence Genitourinary:  No dysuria, urinary retention or frequency.  No nocturia.   Dysfunctional uterine bleeding Musculoskeletal:  notes low back pain that has been worse the last couple months and neck pain that is helped by Botox Integumentary: No rash, pruritus, skin lesions Neurological: as above Psychiatric: No depression at this time.  No anxiety Endocrine: No palpitations, diaphoresis, change in appetite, change in weigh or increased thirst Hematologic/Lymphatic:   She has anemia due to dysfunctional uterine bleeding. Allergic/Immunologic: No itchy/runny eyes, nasal congestion, recent allergic reactions, rashes  ALLERGIES: Allergies  Allergen Reactions   Cefaclor Swelling and Rash     neck swelling   Prochlorperazine Maleate Rash and Other (See Comments)     jittery;tachycardia   Bupropion Anxiety   Sulfamethoxazole-Trimethoprim Nausea Only   Tessalon Perles [Benzonatate] Rash    HOME MEDICATIONS: Outpatient Medications Prior to Visit  Medication Sig Dispense Refill   atenolol (TENORMIN) 25 MG tablet Take 1 tablet (25 mg total) by mouth daily. 90 tablet 3   Botulinum Toxin Type A (BOTOX) 200 units SOLR INJECT INTO THE MUSCLES BY PHYSICIAN EVERY 3 MONTHS FOR MIGRAINE HEADACHES. DISCARD UNUSED PORTION. 1 each 3   clonazePAM (KLONOPIN) 0.5 MG tablet Take 0.5 mg by mouth 2 (two) times daily as needed.     DULoxetine (CYMBALTA) 60 MG capsule Take 1 capsule by mouth daily.     gabapentin (NEURONTIN) 300 MG capsule TAKE 1-2 CAPSULES BY MOUTH AT BEDTIME 60 capsule 11   levothyroxine (SYNTHROID) 25 MCG tablet Take 25 mcg by mouth daily before breakfast.     Multiple Vitamins-Minerals (MULTIVITAMIN WITH MINERALS) tablet Take by mouth.     ondansetron (ZOFRAN-ODT) 8 MG disintegrating tablet TAKE 1 TABLET BY MOUTH EVERY 8 HOURS AS NEEDED FOR NAUSEA OR VOMITING. 18 tablet 4   PNEUMOCOCCAL VAC POLYVALENT IJ Inject as directed. Received in January 2021     rizatriptan (MAXALT-MLT) 10  MG disintegrating tablet TAKE 1 TABLET BY MOUTH AS NEEDED FOR MIGRAINE. MAY REPEAT IN 2 HOURS IF NEEDED 12 tablet 11   rosuvastatin (CRESTOR) 5 MG tablet Take 5 mg by mouth daily.     Semaglutide, 1 MG/DOSE, (OZEMPIC, 1 MG/DOSE,) 2 MG/1.5ML SOPN Please specify directions, refills and quantity     tiZANidine (ZANAFLEX) 4 MG tablet TAKE 1 TABLET BY MOUTH 3 TIMES DAILY. 270 tablet 3   indomethacin (INDOCIN) 25 MG capsule TAKE 1 CAPSULE (25 MG TOTAL) BY MOUTH 2 (TWO) TIMES DAILY WITH A MEAL. 60 capsule 3   oxyCODONE-acetaminophen (PERCOCET) 10-325 MG tablet Take 1 tablet by mouth every 8 (eight) hours as needed for pain. 90 tablet 0   pantoprazole (PROTONIX) 40 MG tablet Take 40 mg by mouth 2 (two) times daily.      No facility-administered medications prior to visit.    PAST MEDICAL HISTORY: Past Medical History:  Diagnosis Date   Headache  Hypertension    Type 2 diabetes mellitus ([place]) 06/08/2018   Vision abnormalities     PAST SURGICAL HISTORY: Past Surgical History:  Procedure Laterality Date   SINUS IRRIGATION      FAMILY HISTORY: Family History  Problem Relation Age of Onset   Breast cancer Mother    Melanoma Mother    Atrial fibrillation Mother    Supraventricular tachycardia Mother    Heart attack Father    Hypertension Father    Hyperlipidemia Father     SOCIAL HISTORY:  Social History   Socioeconomic History   Marital status: Married    Spouse name: Not on file   Number of children: Not on file   Years of education: Not on file   Highest education level: Not on file  Occupational History    Comment: full time  Tobacco Use   Smoking status: Never   Smokeless tobacco: Never  Substance and Sexual Activity   Alcohol use: No    Alcohol/week: 0.0 standard drinks of alcohol    Comment: Rare   Drug use: No   Sexual activity: Not on file  Other Topics Concern   Not on file  Social History Narrative   Lives with spouse   Right Handed   Drinks 1-2 cups  caffeine daily   Social Determinants of Health   Financial Resource Strain: Not on file  Food Insecurity: Not on file  Transportation Needs: Not on file  Physical Activity: Not on file  Stress: Not on file  Social Connections: Not on file  Intimate Partner Violence: Not on file     PHYSICAL EXAM  Vitals:   07/28/22 0847  BP: (!) 169/113  Pulse: 76  Weight: 266 lb 12.8 oz (121 kg)  Height: _0  (1.676 m)      Body mass index is 43.06 kg/m.   General: The patient is well-developed and well-nourished and in no acute distress   Musculoskeletal:   Her neck has a good ROM. She has no neck pain.   Mildly reduced left shoulder range of motion (active).  Neurologic Exam  Mental status: The patient is alert and oriented x 3 at the time of the examination.  Speech is normal.  Cranial nerves: Diplopia that increases to lateral gaze, R>L.  Slight dysconjugate on right gaze, I did not note on left gaze.  No ptosis.    Facial strength and sensation was normal.  Trapezius strength was normal.  No obvious hearing deficits are noted.  Motor:   Muscle tone and bulk is normal. She has normal strength.    Sensory: She has numbness in the left lateral plantar distribution.  There is no tenderness over the tarsal tunnel.   Intact sensation to touch and vibration in the arms.  Reduced vibration in toes and minimally reduced at ankles.    Gait and station: Station and gait are normal.     Deep tendon reflexes are normal and symmetric 2 in the arms and 1 in the legs.       ASSESSMENT AND PLAN    1. Diplopia   2. Chronic migraine w/o aura, not intractable, w/o stat migr   3. Abnormal brain MRI   4. Malignant neoplasm of colon, unspecified part of colon ([place])   5. Facet hypertrophy of lumbar region   6. Obstructive sleep apnea   7. Neck pain        1.   Inject 178 units Botox (22 units wasted) :   Frontalis (  2.5 U x 4), nasalis/corrugators (2.5 U x 3), temporalis  (5 U x 10  ), occipitalis (5 U x 6), splenius capitis (12.5U x 2), trapezius (15 U x 2),   C6C7 paraspinal muscles (10 U x 2),  2.   If migraines worsen again, she will resume an anti-CGRP injection. (She had done well with Ajovy but did better with Botox).      She will also take Maxalt and/or Percocet if a more severe migraine occurs. 3.   Continue to use CPAP nightly for OSA.  4.   She continues to get benefit with radiofrequency ablations for her L4-L5 and L5-S1 facet hypertrophy.  However, she felt the benefit has been shorter acting more recently.   Continue gabapentin to 600 mg at night and can continue tizanidine 5. For diplopia, etiology uncertain - started during chemo but probably ot related.   Could be due to DM (6th nerve) bu tneed to assess for MG and vasculitis with blood tests and structural (has cancer) or ischemic etiology with MRI brain nad orbit.  6.  Return to see me in about 3 months for the next Botox injection, or sooner if she has new or worsening neurologic symptoms.   33-minute office visit with the majority of the time spent face-to-face for history and physical, discussion/counseling and decision-making.  Additional time with record review and documentation.   Maurine Mowbray A. Felecia Shelling, MD, PhD 02/24/7157, 0:63 AM Certified in Neurology, Clinical Neurophysiology, Sleep Medicine, Pain Medicine and Neuroimaging  [facility] [address], [place], [place] [address] [phone]

## 2022-07-29 ENCOUNTER — Telehealth: Payer: Self-pay | Admitting: Neurology

## 2022-07-29 ENCOUNTER — Other Ambulatory Visit: Payer: BC Managed Care – PPO

## 2022-07-29 NOTE — Telephone Encounter (Signed)
Pt scheduled for MRI brain w/wo contrast & MRI orbits w/wo contrast at [facility] on 08/04/22 at 8:00 am.

## 2022-08-04 ENCOUNTER — Other Ambulatory Visit: Payer: BC Managed Care – PPO

## 2022-08-07 ENCOUNTER — Ambulatory Visit: Admit: 2022-08-07 | Discharge: 2022-08-08 | Payer: PRIVATE HEALTH INSURANCE

## 2022-08-07 DIAGNOSIS — I10 Essential (primary) hypertension: Principal | ICD-10-CM

## 2022-08-07 MED ORDER — ROSUVASTATIN 5 MG TABLET
ORAL_TABLET | Freq: Every evening | ORAL | 7 refills | 45 days | Status: CP
Start: 2022-08-07 — End: ?

## 2022-08-08 DIAGNOSIS — C187 Malignant neoplasm of sigmoid colon: Principal | ICD-10-CM

## 2022-08-10 MED ORDER — ROSUVASTATIN 10 MG TABLET
ORAL_TABLET | Freq: Every evening | ORAL | 3 refills | 90 days | Status: CP
Start: 2022-08-10 — End: ?

## 2022-08-11 ENCOUNTER — Ambulatory Visit: Payer: BC Managed Care – PPO

## 2022-08-14 LAB — MYASTHENIA GRAVIS PROFILE
AChR Binding Ab, Serum: <0.03 nmol/L (ref 0.00–0.24)
AChR-modulating Ab: 0 % (ref 0–45)
Acetylchol Block Ab: 20 % (ref 0–25)
Anti-striation Abs: NEGATIVE

## 2022-08-14 LAB — MUSK ANTIBODIES: MuSK Antibodies: <1 U/mL

## 2022-08-14 LAB — SEDIMENTATION RATE: Sed Rate: 12 mm/hr (ref 0–40)

## 2022-08-14 LAB — C-REACTIVE PROTEIN: CRP: 12 mg/L — ABNORMAL HIGH (ref 0–10)

## 2022-08-20 MED ORDER — PEG 3350-ELECTROLYTES 236 GRAM-22.74 GRAM-6.74 GRAM-5.86 GRAM SOLUTION
Freq: Once | ORAL | 0 refills | 1 days | Status: CP
Start: 2022-08-20 — End: 2022-08-20

## 2022-08-24 ENCOUNTER — Telehealth: Admit: 2022-08-24 | Discharge: 2022-08-25 | Payer: PRIVATE HEALTH INSURANCE

## 2022-08-24 DIAGNOSIS — F411 Generalized anxiety disorder: Principal | ICD-10-CM

## 2022-08-25 ENCOUNTER — Telehealth: Payer: Self-pay | Admitting: Neurology

## 2022-08-25 ENCOUNTER — Ambulatory Visit (INDEPENDENT_AMBULATORY_CARE_PROVIDER_SITE_OTHER): Payer: BC Managed Care – PPO

## 2022-08-25 DIAGNOSIS — H532 Diplopia: Secondary | ICD-10-CM | POA: Diagnosis not present

## 2022-08-25 DIAGNOSIS — C189 Malignant neoplasm of colon, unspecified: Secondary | ICD-10-CM

## 2022-08-25 MED ORDER — GADOBENATE DIMEGLUMINE 529 MG/ML IV SOLN
20.0000 mL | Freq: Once | INTRAVENOUS | Status: AC | PRN
  Administered 2022-08-25: 20 mL via INTRAVENOUS

## 2022-08-25 NOTE — Telephone Encounter (Signed)
I spoke to Pamela Murphy about the results.  The MRI of the brain continues to show much more than expected for age T2/FLAIR hyperintense foci in the hemispheres most consistent with chronic microvascular ischemic change.  Demyelination is less likely.  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  Going back to the MRI from 2016, it looks to be a large back then as well.  As there was no other pathology noted, this is likely an incidental finding.  Of note, she has fluctuating mild diplopia that she notes most while driving.  She does not have pain, proptosis or redness.  We discussed that if there is no improvement after couple more months we may want to consider having her see neuro-ophthalmology.

## 2022-08-28 ENCOUNTER — Other Ambulatory Visit: Admit: 2022-08-28 | Discharge: 2022-08-28 | Payer: PRIVATE HEALTH INSURANCE

## 2022-08-28 ENCOUNTER — Ambulatory Visit: Admit: 2022-08-28 | Discharge: 2022-08-28 | Payer: PRIVATE HEALTH INSURANCE

## 2022-08-28 DIAGNOSIS — C187 Malignant neoplasm of sigmoid colon: Principal | ICD-10-CM

## 2022-08-28 DIAGNOSIS — F419 Anxiety disorder, unspecified: Principal | ICD-10-CM

## 2022-08-28 DIAGNOSIS — R918 Other nonspecific abnormal finding of lung field: Principal | ICD-10-CM

## 2022-08-31 DIAGNOSIS — F411 Generalized anxiety disorder: Principal | ICD-10-CM

## 2022-08-31 MED ORDER — DULOXETINE 30 MG CAPSULE,DELAYED RELEASE
ORAL_CAPSULE | 0 refills | 0 days
Start: 2022-08-31 — End: ?

## 2022-09-01 MED ORDER — DULOXETINE 30 MG CAPSULE,DELAYED RELEASE
ORAL_CAPSULE | 0 refills | 0 days
Start: 2022-09-01 — End: ?

## 2022-09-03 ENCOUNTER — Encounter
Admit: 2022-09-03 | Discharge: 2022-09-03 | Payer: PRIVATE HEALTH INSURANCE | Attending: Anesthesiology | Primary: Anesthesiology

## 2022-09-03 ENCOUNTER — Ambulatory Visit: Admit: 2022-09-03 | Discharge: 2022-09-03 | Payer: PRIVATE HEALTH INSURANCE

## 2022-09-07 ENCOUNTER — Ambulatory Visit: Admit: 2022-09-07 | Discharge: 2022-09-08 | Payer: PRIVATE HEALTH INSURANCE

## 2022-09-07 DIAGNOSIS — F4323 Adjustment disorder with mixed anxiety and depressed mood: Principal | ICD-10-CM

## 2022-09-07 DIAGNOSIS — K219 Gastro-esophageal reflux disease without esophagitis: Principal | ICD-10-CM

## 2022-09-07 DIAGNOSIS — E039 Hypothyroidism, unspecified: Principal | ICD-10-CM

## 2022-09-07 DIAGNOSIS — G4733 Obstructive sleep apnea (adult) (pediatric): Principal | ICD-10-CM

## 2022-09-07 DIAGNOSIS — E119 Type 2 diabetes mellitus without complications: Principal | ICD-10-CM

## 2022-09-07 DIAGNOSIS — F3289 Other specified depressive episodes: Principal | ICD-10-CM

## 2022-09-07 MED ORDER — OZEMPIC 2 MG/DOSE (8 MG/3 ML) SUBCUTANEOUS PEN INJECTOR
SUBCUTANEOUS | 11 refills | 28.00000 days | Status: CP
Start: 2022-09-07 — End: 2022-09-07

## 2022-09-07 MED ORDER — LISINOPRIL 10 MG TABLET
ORAL_TABLET | Freq: Every day | ORAL | 3 refills | 90.00000 days | Status: CP
Start: 2022-09-07 — End: 2023-09-07
  Filled 2022-09-11: qty 90, 90d supply, fill #0

## 2022-09-07 MED ORDER — TRULICITY 0.75 MG/0.5 ML SUBCUTANEOUS PEN INJECTOR
SUBCUTANEOUS | 0 refills | 28 days | Status: CN
Start: 2022-09-07 — End: 2022-09-29

## 2022-09-14 ENCOUNTER — Encounter: Payer: Self-pay | Admitting: Neurology

## 2022-09-14 ENCOUNTER — Telehealth: Payer: Self-pay | Admitting: Neurology

## 2022-09-14 ENCOUNTER — Other Ambulatory Visit: Payer: Self-pay | Admitting: Neurology

## 2022-09-14 DIAGNOSIS — H532 Diplopia: Secondary | ICD-10-CM

## 2022-09-14 DIAGNOSIS — H539 Unspecified visual disturbance: Secondary | ICD-10-CM

## 2022-09-14 DIAGNOSIS — R9089 Other abnormal findings on diagnostic imaging of central nervous system: Secondary | ICD-10-CM

## 2022-09-14 NOTE — Telephone Encounter (Signed)
Referral for Ophthalmology fax to [facility], Phone: [phone], Fax: [phone]  Address:  [address] [place], [place] [address]

## 2022-09-16 NOTE — Telephone Encounter (Signed)
Referral refaxed to [facility]. Phone: [phone], Fax: [phone]  Pt requested to be refaxed to [facility].

## 2022-09-21 ENCOUNTER — Other Ambulatory Visit: Payer: Self-pay | Admitting: *Deleted

## 2022-09-21 DIAGNOSIS — R9089 Other abnormal findings on diagnostic imaging of central nervous system: Secondary | ICD-10-CM

## 2022-09-21 DIAGNOSIS — H532 Diplopia: Secondary | ICD-10-CM

## 2022-09-21 NOTE — Telephone Encounter (Signed)
I called [facility] to schedule referral, rep states that Dr. Jolyn Nap is retiring in December and no longer accepting new patients.

## 2022-09-21 NOTE — Telephone Encounter (Signed)
Sent referral to [facility], phone # [phone].

## 2022-09-22 ENCOUNTER — Telehealth
Admit: 2022-09-22 | Discharge: 2022-09-23 | Payer: PRIVATE HEALTH INSURANCE | Attending: Registered" | Primary: Registered"

## 2022-09-22 DIAGNOSIS — E119 Type 2 diabetes mellitus without complications: Principal | ICD-10-CM

## 2022-09-28 ENCOUNTER — Institutional Professional Consult (permissible substitution)
Admit: 2022-09-28 | Discharge: 2022-09-29 | Payer: PRIVATE HEALTH INSURANCE | Attending: Pharmacist | Primary: Pharmacist

## 2022-09-28 DIAGNOSIS — I10 Essential (primary) hypertension: Principal | ICD-10-CM

## 2022-09-28 MED ORDER — INSULIN DETEMIR (U-100) 100 UNIT/ML (3 ML) SUBCUTANEOUS PEN
Freq: Every evening | SUBCUTANEOUS | 3 refills | 150 days | Status: CP
Start: 2022-09-28 — End: 2023-09-28
  Filled 2022-10-05: qty 15, 150d supply, fill #0

## 2022-09-28 MED ORDER — LISINOPRIL 20 MG TABLET
ORAL_TABLET | Freq: Every day | ORAL | 11 refills | 30 days | Status: CP
Start: 2022-09-28 — End: 2023-09-28
  Filled 2022-10-05: qty 30, 30d supply, fill #0

## 2022-09-29 ENCOUNTER — Telehealth: Payer: Self-pay | Admitting: Neurology

## 2022-09-29 NOTE — Telephone Encounter (Signed)
Needs Botox re-auth. Pt has appt 10/27/22.   Chronic Migraine CPT 64615    Botox J0585 Units:200   G43.709 Chronic migraine w/o aura, not intractable, w/o stat migr

## 2022-09-29 NOTE — Telephone Encounter (Signed)
Pt will need new authorization for botox before appointment scheduled on 12/5. Previous authorization expired on 08/11/22

## 2022-09-29 NOTE — Telephone Encounter (Signed)
[employer] Benefit verification submitted  Key:  [id number]

## 2022-09-30 ENCOUNTER — Other Ambulatory Visit (HOSPITAL_COMMUNITY): Payer: Self-pay

## 2022-09-30 ENCOUNTER — Encounter: Payer: Self-pay | Admitting: Neurology

## 2022-09-30 NOTE — Telephone Encounter (Signed)
Called [facility] at [phone]. Spoke w/ Barnetta Chapel. Set up to deliver Botox 10/06/22.

## 2022-10-04 MED ORDER — TRULICITY 4.5 MG/0.5 ML SUBCUTANEOUS PEN INJECTOR
SUBCUTANEOUS | 11 refills | 0 days | Status: CP
Start: 2022-10-04 — End: ?
  Filled 2022-10-05: qty 2, 28d supply, fill #0

## 2022-10-05 MED ORDER — TRULICITY 1.5 MG/0.5 ML SUBCUTANEOUS PEN INJECTOR
SUBCUTANEOUS | 0 refills | 28 days | Status: CN
Start: 2022-10-05 — End: 2022-10-27

## 2022-10-06 ENCOUNTER — Encounter: Payer: Self-pay | Admitting: Neurology

## 2022-10-08 MED ORDER — PEN NEEDLE, DIABETIC 31 GAUGE X 5/16" (8 MM)
11 refills | 0 days | Status: CP
Start: 2022-10-08 — End: 2023-10-08

## 2022-10-13 ENCOUNTER — Institutional Professional Consult (permissible substitution)
Admit: 2022-10-13 | Discharge: 2022-10-14 | Payer: PRIVATE HEALTH INSURANCE | Attending: Pharmacist | Primary: Pharmacist

## 2022-10-13 ENCOUNTER — Encounter: Payer: Self-pay | Admitting: Neurology

## 2022-10-13 DIAGNOSIS — E119 Type 2 diabetes mellitus without complications: Principal | ICD-10-CM

## 2022-10-13 DIAGNOSIS — I10 Essential (primary) hypertension: Principal | ICD-10-CM

## 2022-10-20 ENCOUNTER — Telehealth
Admit: 2022-10-20 | Discharge: 2022-10-21 | Payer: PRIVATE HEALTH INSURANCE | Attending: Registered" | Primary: Registered"

## 2022-10-20 ENCOUNTER — Ambulatory Visit: Payer: BC Managed Care – PPO | Admitting: Neurology

## 2022-10-20 DIAGNOSIS — E119 Type 2 diabetes mellitus without complications: Principal | ICD-10-CM

## 2022-10-21 ENCOUNTER — Other Ambulatory Visit: Payer: Self-pay | Admitting: Neurology

## 2022-10-26 ENCOUNTER — Ambulatory Visit: Admit: 2022-10-26 | Discharge: 2022-10-27 | Payer: PRIVATE HEALTH INSURANCE

## 2022-10-26 DIAGNOSIS — C187 Malignant neoplasm of sigmoid colon: Principal | ICD-10-CM

## 2022-10-26 DIAGNOSIS — I10 Essential (primary) hypertension: Principal | ICD-10-CM

## 2022-10-27 ENCOUNTER — Telehealth: Payer: Self-pay | Admitting: Neurology

## 2022-10-27 ENCOUNTER — Encounter: Payer: Self-pay | Admitting: Neurology

## 2022-10-27 ENCOUNTER — Ambulatory Visit (INDEPENDENT_AMBULATORY_CARE_PROVIDER_SITE_OTHER): Payer: BC Managed Care – PPO | Admitting: Neurology

## 2022-10-27 VITALS — BP 146/92 | HR 83 | Ht 66.0 in | Wt 265.5 lb

## 2022-10-27 DIAGNOSIS — G4733 Obstructive sleep apnea (adult) (pediatric): Secondary | ICD-10-CM | POA: Diagnosis not present

## 2022-10-27 DIAGNOSIS — H532 Diplopia: Secondary | ICD-10-CM | POA: Diagnosis not present

## 2022-10-27 DIAGNOSIS — G43709 Chronic migraine without aura, not intractable, without status migrainosus: Secondary | ICD-10-CM

## 2022-10-27 DIAGNOSIS — M47816 Spondylosis without myelopathy or radiculopathy, lumbar region: Secondary | ICD-10-CM

## 2022-10-27 DIAGNOSIS — H509 Unspecified strabismus: Secondary | ICD-10-CM

## 2022-10-27 NOTE — Progress Notes (Signed)
[facility]  PATIENT: Pamela Murphy DOB: 11/10/1971  REASON FOR VISIT: Chronic Migraine headaches   HISTORICAL  CHIEF COMPLAINT:  Chief Complaint  Patient presents with   Follow-up    Pt in room #10 and alone. Pt here today for botox injection for migraines.    HISTORY OF PRESENT ILLNESS:  Pamela Murphy is a 51 y.o. woman with a chronic migraine disorder, obstructive sleep apnea and abnormal MRI of the brain.   Update 10/27/2022 She has diplopia that started early this year, concurrent with chemo (oxiplatin and capectotabine).  It fluctuates quite a bit.   It is binocular and often worse on lateral gaze but can be present at primary gaze (esp with driving so often closes one eye)  She reports that the migraine headaches continues to do well on Botox injections every 3 months.  Even with stress colon cancer, surgery and chemotherapy headaches are doing much better than they did a couple years ago..    When migraine occurs she  takes nothing or a triptan and Percocet if pain does not improve.  Indomethacin caused GI issues,   Botox also has helped the neck pain some.  HA worse since atenolol d/c.   Having a lot of nausea today.  Getting chemotherapy she did have some numbness in her feet that has since improved.  However, she has persistent left lateral plantar region.  There is no weakness or pain noted.     She has OSA and is on CPAP and tolerates it well.    Download last year showed 100% compliance with excellent efficacy (AHI of 0.8).  She sleeps better when she uses CPAP than when she does not.  She feels refreshed in the morning.  Her back pain is better since the RFA.  MRI of the lumbar spine has shown left greater than right facet hypertrophy at L4-L5 and L5-S1.  She has colon cancer and had a partial resection resection.   She did oxiplatin and capectotabine.   No liver met's.   She has 2 small nodules in lung nit felt to be cancer.  .   She saw [facility] (Neuro-ophth) and was diagnosed with strabismus - recommendations were prism glasses vs strabismus surgery.  She would prefer to do the surgery and we can refer to [place].     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    She did just start menopause.   She takes tizanidine.  Initially it helped her sleep but now even 2 pills has not helped much.      Trazodone had not helped 10-15 years ago.   She takes gabapentin 300 mgat night as well.    She has RLS, worse on the left.     Migraine history:  She has a long history of chronic migraines that were occurring almost every day (25-28 days/month) for 4 or more hours a day. Multiple medications were tried for prophylactic and abortive therapies with suboptimal response.    In the past, she has failed multiple medications including:  Antiepileptics (Topamax, Keppra, zonisamide), anti-depressants (Cymbalta, Prozac), multiple NSAID's, betas blockers (Inderal, atenolol), muscle relaxants (soma, tizanidine, cyclobenzaprine) in various combinations.   Once the headache occurs, she has tried triptan's with incomplete benefit much of the time. She has also tried opiates.   IM Toradol has been most effective when one occurs.  Abnormal MRI:  She was diagnosed with MS in the past and was on interferon  therapy for several years.   I "undiagnosed her" around 2010 and she got a second opinion from Dr. Terie Purser who also concurred.  MRI shows small round foci predominantly in the subcortical and deep white matter in a nonspecific manner.  Her last MRI of the brain was in 2016 and was stable compared to the previous ones.  She had had paresthesias and fatigue in the past that contributed to her diagnosis.   We have discussed that she has been stable times many years off therapy that MS is significantly less likely.   REVIEW OF SYSTEMS:  Constitutional: No fevers, chills, sweats, or change in appetite Eyes: No visual changes, double vision, eye  pain Ear, nose and throat: No hearing loss, ear pain, nasal congestion, sore throat Cardiovascular: No chest pain, palpitations Respiratory:  see above.  She has OSA. GastrointestinaI: No nausea, vomiting, diarrhea, abdominal pain, fecal incontinence Genitourinary:  No dysuria, urinary retention or frequency.  No nocturia.   Dysfunctional uterine bleeding Musculoskeletal:  notes low back pain that has been worse the last couple months and neck pain that is helped by Botox Integumentary: No rash, pruritus, skin lesions Neurological: as above Psychiatric: No depression at this time.  No anxiety Endocrine: No palpitations, diaphoresis, change in appetite, change in weigh or increased thirst Hematologic/Lymphatic:   She has anemia due to dysfunctional uterine bleeding. Allergic/Immunologic: No itchy/runny eyes, nasal congestion, recent allergic reactions, rashes  ALLERGIES: Allergies  Allergen Reactions   Cefaclor Swelling and Rash     neck swelling   Prochlorperazine Maleate Rash and Other (See Comments)     jittery;tachycardia   Bupropion Anxiety   Sulfamethoxazole-Trimethoprim Nausea Only   Tessalon Perles [Benzonatate] Rash    HOME MEDICATIONS: Outpatient Medications Prior to Visit  Medication Sig Dispense Refill   atenolol (TENORMIN) 25 MG tablet Take 1 tablet (25 mg total) by mouth daily. 90 tablet 3   Botulinum Toxin Type A (BOTOX) 200 units SOLR INJECT INTO THE MUSCLES BY PHYSICIAN EVERY 3 MONTHS FOR MIGRAINE HEADACHES. DISCARD UNUSED PORTION. 1 each 3   clonazePAM (KLONOPIN) 0.5 MG tablet Take 0.5 mg by mouth 2 (two) times daily as needed.     DULoxetine (CYMBALTA) 60 MG capsule Take 1 capsule by mouth daily.     gabapentin (NEURONTIN) 300 MG capsule TAKE 1-2 CAPSULES BY MOUTH AT BEDTIME 60 capsule 11   Multiple Vitamins-Minerals (MULTIVITAMIN WITH MINERALS) tablet Take by mouth.     ondansetron (ZOFRAN-ODT) 8 MG disintegrating tablet TAKE 1 TABLET BY MOUTH EVERY 8 HOURS AS  NEEDED FOR NAUSEA AND VOMITING 18 tablet 4   oxyCODONE-acetaminophen (PERCOCET) 10-325 MG tablet Take 1 tablet by mouth every 8 (eight) hours as needed for pain. 90 tablet 0   PNEUMOCOCCAL VAC POLYVALENT IJ Inject as directed. Received in January 2021     rizatriptan (MAXALT-MLT) 10 MG disintegrating tablet TAKE 1 TABLET BY MOUTH AS NEEDED FOR MIGRAINE. MAY REPEAT IN 2 HOURS IF NEEDED 12 tablet 11   tiZANidine (ZANAFLEX) 4 MG tablet TAKE 1 TABLET BY MOUTH 3 TIMES DAILY. 270 tablet 3   levothyroxine (SYNTHROID) 25 MCG tablet Take 25 mcg by mouth daily before breakfast.     pantoprazole (PROTONIX) 40 MG tablet Take 40 mg by mouth 2 (two) times daily.      rosuvastatin (CRESTOR) 5 MG tablet Take 5 mg by mouth daily.     Semaglutide, 1 MG/DOSE, (OZEMPIC, 1 MG/DOSE,) 2 MG/1.5ML SOPN Please specify directions, refills and quantity     No  facility-administered medications prior to visit.    PAST MEDICAL HISTORY: Past Medical History:  Diagnosis Date   Headache    Hypertension    Type 2 diabetes mellitus ([place]) 06/08/2018   Vision abnormalities     PAST SURGICAL HISTORY: Past Surgical History:  Procedure Laterality Date   SINUS IRRIGATION      FAMILY HISTORY: Family History  Problem Relation Age of Onset   Breast cancer Mother    Melanoma Mother    Atrial fibrillation Mother    Supraventricular tachycardia Mother    Heart attack Father    Hypertension Father    Hyperlipidemia Father     SOCIAL HISTORY:  Social History   Socioeconomic History   Marital status: Married    Spouse name: Not on file   Number of children: Not on file   Years of education: Not on file   Highest education level: Not on file  Occupational History    Comment: full time  Tobacco Use   Smoking status: Never   Smokeless tobacco: Never  Substance and Sexual Activity   Alcohol use: No    Alcohol/week: 0.0 standard drinks of alcohol    Comment: Rare   Drug use: No   Sexual activity: Not on file   Other Topics Concern   Not on file  Social History Narrative   Lives with spouse   Right Handed   Drinks 1-2 cups caffeine daily   Social Determinants of Health   Financial Resource Strain: Not on file  Food Insecurity: Not on file  Transportation Needs: Not on file  Physical Activity: Not on file  Stress: Not on file  Social Connections: Not on file  Intimate Partner Violence: Not on file     PHYSICAL EXAM  Vitals:   10/27/22 0825  BP: (!) 146/92  Pulse: 83  Weight: 265 lb 8 oz (120.4 kg)  Height: _0  (1.676 m)      Body mass index is 42.85 kg/m.   General: The patient is well-developed and well-nourished and in no acute distress   Musculoskeletal:   Her neck has a good ROM. She has no neck pain.   Mildly reduced left shoulder range of motion (active).  Neurologic Exam  Mental status: The patient is alert and oriented x 3 at the time of the examination.  Speech is normal.  Cranial nerves: She has diplopia that increases to lateral gaze, R>L.  Slight dysconjugate on right gaze, none on left gaze.  No ptosis.    Facial strength and sensation was normal.  Trapezius strength was normal.  No obvious hearing deficits are noted.  Motor:   Muscle tone and bulk is normal. She has normal strength.    Sensory: She has numbness in the left lateral plantar distribution.  There is no tenderness over the tarsal tunnel.   Intact sensation to touch and vibration in the arms and knees   Gait and station: Station and gait are normal.     Deep tendon reflexes are normal and symmetric 2 in the arms and 1 in the legs.       ASSESSMENT AND PLAN    1. Strabismus   2. Chronic migraine w/o aura, not intractable, w/o stat migr   3. Diplopia   4. Obstructive sleep apnea   5. Facet syndrome, lumbar     1.   Inject 173 units Botox (27 units wasted) :   Frontalis (2.5 U x 4), nasalis/corrugators (2.5 U x 3), temporalis  (  5 U x 10 ), occipitalis (5 U x 6), splenius capitis  (12.5U x 2), trapezius (15 U x 2),   C6C7 paraspinal muscles (10 U x 2),  2.   She will also take Maxalt and/or Percocet if a more severe migraine occurs.  If they worsen, consider anti-CGRP. 3.   Continue to use CPAP nightly for OSA.  4.  If facet syndrome worsens, we can refer back for radiofrequency ablations for her L4-L5 and L5-S1 facet hypertrophy.  However, she felt the benefit has been shorter acting more recently.   Continue gabapentin to 600 mg at night and can continue tizanidine 5. For diplopia, etiology uncertain - started during chemo but probably ot related.   Could be due to DM (6th nerve).  Since persistent we can refer to [facility] for strabismus surgery.    6.  Return to see me in about 3 months for the next Botox injection, or sooner if she has new or worsening neurologic symptoms.    30-minute office visit with the majority of the time spent face-to-face for history and physical, discussion/counseling and decision-making.  Additional time with record review and documentation.   Lucy Woolever A. Felecia Shelling, MD, PhD 94/01/2002, 79:44 AM Certified in Neurology, Clinical Neurophysiology, Sleep Medicine, Pain Medicine and Neuroimaging  [facility] [address] [place], [place] [address] [phone]

## 2022-10-27 NOTE — Telephone Encounter (Signed)
Referral for Pediatric Ophthalmology faxed to [facility]. Phone: [phone], Fax: [phone]

## 2022-10-27 NOTE — Telephone Encounter (Signed)
error 

## 2022-10-28 MED FILL — TRULICITY 4.5 MG/0.5 ML SUBCUTANEOUS PEN INJECTOR: SUBCUTANEOUS | 28 days supply | Qty: 2 | Fill #1

## 2022-11-03 ENCOUNTER — Institutional Professional Consult (permissible substitution)
Admit: 2022-11-03 | Discharge: 2022-11-04 | Payer: PRIVATE HEALTH INSURANCE | Attending: Pharmacist | Primary: Pharmacist

## 2022-11-03 ENCOUNTER — Telehealth: Payer: Self-pay | Admitting: *Deleted

## 2022-11-03 NOTE — Telephone Encounter (Signed)
A cd and report mailed to patient address.

## 2022-11-27 MED ORDER — PEN NEEDLE, DIABETIC 31 GAUGE X 5/16" (8 MM)
11 refills | 0 days
Start: 2022-11-27 — End: 2023-11-27

## 2022-11-30 MED ORDER — PEN NEEDLE, DIABETIC 31 GAUGE X 5/16" (8 MM)
11 refills | 0 days | Status: CP
Start: 2022-11-30 — End: 2023-11-27

## 2022-11-30 MED FILL — TRULICITY 4.5 MG/0.5 ML SUBCUTANEOUS PEN INJECTOR: SUBCUTANEOUS | 28 days supply | Qty: 2 | Fill #2

## 2022-11-30 MED FILL — LISINOPRIL 20 MG TABLET: ORAL | 30 days supply | Qty: 30 | Fill #1

## 2022-12-04 ENCOUNTER — Telehealth: Admit: 2022-12-04 | Discharge: 2022-12-05 | Payer: PRIVATE HEALTH INSURANCE

## 2022-12-04 DIAGNOSIS — F411 Generalized anxiety disorder: Principal | ICD-10-CM

## 2022-12-04 MED ORDER — DULOXETINE 30 MG CAPSULE,DELAYED RELEASE
ORAL_CAPSULE | Freq: Every day | ORAL | 3 refills | 90 days | Status: CP
Start: 2022-12-04 — End: 2023-12-04
  Filled 2022-12-08: qty 90, 30d supply, fill #0

## 2022-12-04 MED ORDER — CLONAZEPAM 0.5 MG TABLET
ORAL_TABLET | Freq: Two times a day (BID) | ORAL | 1 refills | 23 days | Status: CP | PRN
Start: 2022-12-04 — End: ?
  Filled 2022-12-08: qty 45, 23d supply, fill #0

## 2022-12-11 DIAGNOSIS — I201 Angina pectoris with documented spasm: Principal | ICD-10-CM

## 2022-12-11 MED ORDER — DILTIAZEM ER 300 MG CAPSULE,24 HR,EXTENDED RELEASE
ORAL_CAPSULE | Freq: Every day | ORAL | 5 refills | 30 days | Status: CP
Start: 2022-12-11 — End: ?

## 2022-12-11 MED ORDER — ISOSORBIDE MONONITRATE ER 30 MG TABLET,EXTENDED RELEASE 24 HR
ORAL_TABLET | Freq: Every day | ORAL | 5 refills | 30 days | Status: CP
Start: 2022-12-11 — End: ?

## 2022-12-21 ENCOUNTER — Ambulatory Visit: Admit: 2022-12-21 | Discharge: 2022-12-22 | Payer: PRIVATE HEALTH INSURANCE

## 2022-12-21 DIAGNOSIS — R7309 Other abnormal glucose: Principal | ICD-10-CM

## 2022-12-21 DIAGNOSIS — C187 Malignant neoplasm of sigmoid colon: Principal | ICD-10-CM

## 2022-12-21 MED ORDER — ACCU-CHEK SOFTCLIX LANCETS
3 refills | 0 days | Status: CP
Start: 2022-12-21 — End: ?
  Filled 2023-01-05: qty 300, 75d supply, fill #0

## 2022-12-21 MED ORDER — TRULICITY 4.5 MG/0.5 ML SUBCUTANEOUS PEN INJECTOR
SUBCUTANEOUS | 3 refills | 84 days | Status: CP
Start: 2022-12-21 — End: ?
  Filled 2023-02-09: qty 2, 28d supply, fill #0

## 2022-12-21 MED ORDER — INSULIN DEGLUDEC (U-100) 100 UNIT/ML (3 ML) SUBCUTANEOUS PEN
3 refills | 0 days | Status: CP
Start: 2022-12-21 — End: ?
  Filled 2023-01-05: qty 15, 50d supply, fill #0

## 2022-12-21 MED ORDER — GLUCAGON 3 MG/ACTUATION NASAL SPRAY
2 refills | 0 days | Status: CP
Start: 2022-12-21 — End: ?
  Filled 2023-01-05: qty 1, 1d supply, fill #0

## 2022-12-21 MED ORDER — INSULIN LISPRO (U-100) 100 UNIT/ML SUBCUTANEOUS PEN
3 refills | 0 days | Status: CP
Start: 2022-12-21 — End: ?

## 2022-12-21 MED ORDER — PEN NEEDLE, DIABETIC 32 GAUGE X 5/32" (4 MM)
6 refills | 0 days | Status: CP
Start: 2022-12-21 — End: ?
  Filled 2023-01-05: qty 200, 66d supply, fill #0

## 2022-12-21 MED ORDER — ACCU-CHEK GUIDE TEST STRIPS
ORAL_STRIP | 3 refills | 0 days | Status: CP
Start: 2022-12-21 — End: ?
  Filled 2023-01-05: qty 300, 89d supply, fill #0

## 2022-12-21 MED ORDER — DEXCOM G7 SENSOR DEVICE
3 refills | 0 days | Status: CP
Start: 2022-12-21 — End: ?

## 2022-12-24 ENCOUNTER — Ambulatory Visit: Admit: 2022-12-24 | Discharge: 2022-12-24 | Payer: PRIVATE HEALTH INSURANCE

## 2022-12-24 ENCOUNTER — Other Ambulatory Visit: Admit: 2022-12-24 | Discharge: 2022-12-24 | Payer: PRIVATE HEALTH INSURANCE

## 2022-12-24 DIAGNOSIS — C187 Malignant neoplasm of sigmoid colon: Principal | ICD-10-CM

## 2022-12-29 DIAGNOSIS — R7309 Other abnormal glucose: Principal | ICD-10-CM

## 2022-12-29 DIAGNOSIS — C187 Malignant neoplasm of sigmoid colon: Principal | ICD-10-CM

## 2023-01-05 ENCOUNTER — Encounter: Payer: Self-pay | Admitting: Neurology

## 2023-01-05 ENCOUNTER — Other Ambulatory Visit: Payer: Self-pay | Admitting: Neurology

## 2023-01-05 MED ORDER — INSULIN ASPART (U-100) 100 UNIT/ML (3 ML) SUBCUTANEOUS PEN
3 refills | 0 days | Status: CP
Start: 2023-01-05 — End: ?
  Filled 2023-02-09: qty 15, 140d supply, fill #0

## 2023-01-05 MED ORDER — OXYCODONE-ACETAMINOPHEN 10-325 MG PO TABS: 1.0000 | ORAL_TABLET | Freq: Three times a day (TID) | ORAL | 0 refills | Status: DC | PRN

## 2023-01-05 MED FILL — LISINOPRIL 20 MG TABLET: ORAL | 30 days supply | Qty: 30 | Fill #2

## 2023-01-05 MED FILL — DEXCOM G7 SENSOR DEVICE: 90 days supply | Qty: 9 | Fill #0

## 2023-01-05 NOTE — Telephone Encounter (Signed)
Per drug registry, last refilled oxycodone 10-345m 07/28/22 #90.

## 2023-01-18 ENCOUNTER — Ambulatory Visit: Payer: BC Managed Care – PPO | Admitting: Neurology

## 2023-01-25 ENCOUNTER — Ambulatory Visit (INDEPENDENT_AMBULATORY_CARE_PROVIDER_SITE_OTHER): Payer: BC Managed Care – PPO | Admitting: Neurology

## 2023-01-25 VITALS — Ht 66.0 in

## 2023-01-25 DIAGNOSIS — M542 Cervicalgia: Secondary | ICD-10-CM | POA: Diagnosis not present

## 2023-01-25 DIAGNOSIS — M47816 Spondylosis without myelopathy or radiculopathy, lumbar region: Secondary | ICD-10-CM | POA: Diagnosis not present

## 2023-01-25 DIAGNOSIS — H509 Unspecified strabismus: Secondary | ICD-10-CM | POA: Diagnosis not present

## 2023-01-25 DIAGNOSIS — G4733 Obstructive sleep apnea (adult) (pediatric): Secondary | ICD-10-CM

## 2023-01-25 DIAGNOSIS — G43709 Chronic migraine without aura, not intractable, without status migrainosus: Secondary | ICD-10-CM

## 2023-01-25 NOTE — Progress Notes (Signed)
[facility]  PATIENT: Pamela Murphy DOB: 1971/08/19  REASON FOR VISIT: Chronic Migraine headaches   HISTORICAL  CHIEF COMPLAINT:  Chief Complaint  Patient presents with   Follow-up    Pt room 11, here for botox injection for migraines.     HISTORY OF PRESENT ILLNESS:  Pamela Murphy is a 52 y.o. woman with a chronic migraine disorder, obstructive sleep apnea and abnormal MRI of the brain.   Update 01/25/2023 She is still experiencing diplopia that started early this year, concurrent with chemo (oxiplatin and capectotabine).  It fluctuates some .  She saw [facility] (Neuro-ophth) and was diagnosed with strabismus - recommendations were prism glasses vs strabismus surgery.  She would prefer to do the surgery and we can refer to [place].    Diplopia iis binocular and often worse on lateral gaze but can be present at primary gaze (esp with driving so often closes one eye).   Diplopia a trigger a migraine.    She only drives short distance.  Chronic migraine headaches continues to do well on Botox injections every 3 months.  Due to having mild ptosis in the past a smaller amount is placed into the frontalis muscles even with stress colon cancer, surgery and chemotherapy headaches are doing much better than they did a couple years ago..    When migraine occurs she  takes nothing or a triptan and Percocet if pain does not improve.  Indomethacin caused GI issues,   Botox also has helped the neck pain some.    While receiving chemotherapy she did have some numbness in her feet that has since improved.  However, she has persistent left lateral plantar region.  There is no weakness or pain noted.     She has OSA and is on CPAP and tolerates it well.    Download last year showed 100% compliance with excellent efficacy (AHI of 0.8).  She sleeps better when she uses CPAP than when she does not.  She feels refreshed in the morning.  Her back pain is better since the RFA.  MRI  of the lumbar spine has shown left greater than right facet hypertrophy at L4-L5 and L5-S1.  She has colon cancer and had a partial resection resection.   She did oxiplatin and capectotabine.   No liver met's.   She has 2 small nodules in lung nit felt to be cancer.  . She has had repeat cancer markers and they are fine.      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    She did just start menopause.   She takes tizanidine.  Initially it helped her sleep but now even 2 pills has not helped much.      Trazodone had not helped 10-15 years ago.   She takes gabapentin 300 mgat night as well.    She has RLS, worse on the left.     Migraine history:  She has a long history of chronic migraines that were occurring almost every day (25-28 days/month) for 4 or more hours a day. Multiple medications were tried for prophylactic and abortive therapies with suboptimal response.    In the past, she has failed multiple medications including:  Antiepileptics (Topamax, Keppra, zonisamide), anti-depressants (Cymbalta, Prozac), multiple NSAID's, betas blockers (Inderal, atenolol), muscle relaxants (soma, tizanidine, cyclobenzaprine) in various combinations.   Once the headache occurs, she has tried triptan's with incomplete benefit much of the time. She has also tried opiates.  IM Toradol has been most effective when one occurs.  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   I "undiagnosed her" around 2010 and she got a second opinion from Dr. Terie Purser who also concurred.  MRI shows small round foci predominantly in the subcortical and deep white matter in a nonspecific manner.  Her last MRI of the brain was in 2016 and was stable compared to the previous ones.  She had had paresthesias and fatigue in the past that contributed to her diagnosis.   We have discussed that she has been stable times many years off therapy that MS is significantly less  likely.   REVIEW OF SYSTEMS:  Constitutional: No fevers, chills, sweats, or change in appetite Eyes: No visual changes, double vision, eye pain Ear, nose and throat: No hearing loss, ear pain, nasal congestion, sore throat Cardiovascular: No chest pain, palpitations Respiratory:  see above.  She has OSA. GastrointestinaI: No nausea, vomiting, diarrhea, abdominal pain, fecal incontinence Genitourinary:  No dysuria, urinary retention or frequency.  No nocturia.   Dysfunctional uterine bleeding Musculoskeletal:  notes low back pain that has been worse the last couple months and neck pain that is helped by Botox Integumentary: No rash, pruritus, skin lesions Neurological: as above Psychiatric: No depression at this time.  No anxiety Endocrine: No palpitations, diaphoresis, change in appetite, change in weigh or increased thirst Hematologic/Lymphatic:   She has anemia due to dysfunctional uterine bleeding. Allergic/Immunologic: No itchy/runny eyes, nasal congestion, recent allergic reactions, rashes  ALLERGIES: Allergies  Allergen Reactions   Cefaclor Swelling and Rash     neck swelling   Prochlorperazine Maleate Rash and Other (See Comments)     jittery;tachycardia   Bupropion Anxiety   Sulfamethoxazole-Trimethoprim Nausea Only   Tessalon Perles [Benzonatate] Rash    HOME MEDICATIONS: Outpatient Medications Prior to Visit  Medication Sig Dispense Refill   atenolol (TENORMIN) 25 MG tablet Take 1 tablet (25 mg total) by mouth daily. 90 tablet 3   Botulinum Toxin Type A (BOTOX) 200 units SOLR INJECT INTO THE MUSCLES BY PHYSICIAN EVERY 3 MONTHS FOR MIGRAINE HEADACHES. DISCARD UNUSED PORTION. 1 each 3   clonazePAM (KLONOPIN) 0.5 MG tablet Take 0.5 mg by mouth 2 (two) times daily as needed.     DULoxetine (CYMBALTA) 60 MG capsule Take 1 capsule by mouth daily. 90 mg tablet daily     gabapentin (NEURONTIN) 300 MG capsule TAKE 1-2 CAPSULES BY MOUTH AT BEDTIME 60 capsule 11   Multiple  Vitamins-Minerals (MULTIVITAMIN WITH MINERALS) tablet Take by mouth.     ondansetron (ZOFRAN-ODT) 8 MG disintegrating tablet TAKE 1 TABLET BY MOUTH EVERY 8 HOURS AS NEEDED FOR NAUSEA AND VOMITING 18 tablet 4   oxyCODONE-acetaminophen (PERCOCET) 10-325 MG tablet Take 1 tablet by mouth every 8 (eight) hours as needed for pain. 90 tablet 0   pantoprazole (PROTONIX) 40 MG tablet Take 40 mg by mouth 2 (two) times daily.      PNEUMOCOCCAL VAC POLYVALENT IJ Inject as directed. Received in January 2021     rizatriptan (MAXALT-MLT) 10 MG disintegrating tablet TAKE 1 TABLET BY MOUTH AS NEEDED FOR MIGRAINE. MAY REPEAT IN 2 HOURS IF NEEDED 12 tablet 11   tiZANidine (ZANAFLEX) 4 MG tablet TAKE 1 TABLET BY MOUTH 3 TIMES DAILY. 270 tablet 3   No facility-administered medications prior to visit.    PAST MEDICAL HISTORY: Past Medical History:  Diagnosis Date   Headache  Hypertension    Type 2 diabetes mellitus ([place]) 06/08/2018   Vision abnormalities     PAST SURGICAL HISTORY: Past Surgical History:  Procedure Laterality Date   SINUS IRRIGATION      FAMILY HISTORY: Family History  Problem Relation Age of Onset   Breast cancer Mother    Melanoma Mother    Atrial fibrillation Mother    Supraventricular tachycardia Mother    Heart attack Father    Hypertension Father    Hyperlipidemia Father     SOCIAL HISTORY:  Social History   Socioeconomic History   Marital status: Married    Spouse name: Not on file   Number of children: Not on file   Years of education: Not on file   Highest education level: Not on file  Occupational History    Comment: full time  Tobacco Use   Smoking status: Never   Smokeless tobacco: Never  Substance and Sexual Activity   Alcohol use: No    Alcohol/week: 0.0 standard drinks of alcohol    Comment: Rare   Drug use: No   Sexual activity: Not on file  Other Topics Concern   Not on file  Social History Narrative   Lives with spouse   Right Handed    Drinks 1-2 cups caffeine daily   Social Determinants of Health   Financial Resource Strain: Not on file  Food Insecurity: Not on file  Transportation Needs: Not on file  Physical Activity: Not on file  Stress: Not on file  Social Connections: Not on file  Intimate Partner Violence: Not on file     PHYSICAL EXAM  Vitals:   01/25/23 1320  Height: '5\' 6"'$  (1.676 m)      Body mass index is 42.85 kg/m.   General: The patient is well-developed and well-nourished and in no acute distress   Musculoskeletal:   Her neck has a good ROM. She has no neck pain.   Mildly reduced left shoulder range of motion (active).  Neurologic Exam  Mental status: The patient is alert and oriented x 3 at the time of the examination.  Speech is normal.  Cranial nerves: She has diplopia that increases to lateral gaze, R>L.  Slight dysconjugate on right gaze, none on left gaze.  No ptosis.    Facial strength and sensation was normal.  Trapezius strength was normal.  No obvious hearing deficits are noted.  Motor:   Muscle tone and bulk is normal. She has normal strength.    Sensory: There was intact sensation to touch in the arms and legs.  Gait and station: Station and gait are normal.     Deep tendon reflexes are normal and symmetric 2 in the arms and 1 in the legs.       ASSESSMENT AND PLAN    1. Chronic migraine w/o aura w/o status migrainosus, not intractable   2. Neck pain   3. Facet syndrome, lumbar   4. Strabismus   5. Obstructive sleep apnea     1.  We will inject 173 units Botox (27 units wasted) :   Frontalis (2.5 U x 4), nasalis/corrugators (2.5 U x 3), temporalis  (5 U x 10 ), occipitalis (5 U x 6), splenius capitis (12.5U x 2), trapezius (15 U x 2),   C6C7 paraspinal muscles (10 U x 2),  2.   She will take Maxalt and/or Percocet if a more severe migraine occurs.  If they worsen, consider anti-CGRP. 3.   Continue to use  CPAP nightly for OSA.  4.  If facet syndrome worsens, we  can refer back for radiofrequency ablations for her L4-L5 and L5-S1 facet hypertrophy.  However, she felt the benefit has been shorter duration (9 months) more recently.   Continue gabapentin to 600 mg at night and can continue tizanidine 5.  She has been referred for strabismus surgery.    6.  Return to see me in about 3 months for the next Botox injection, or sooner if she has new or worsening neurologic symptoms.     Syriana Croslin A. Felecia Shelling, MD, PhD [id number], [id number] PM Certified in Neurology, Clinical Neurophysiology, Sleep Medicine, Pain Medicine and Neuroimaging  [facility] [address] [place], [place] [address] [phone]

## 2023-02-09 MED FILL — BAQSIMI 3 MG/ACTUATION NASAL SPRAY: 1 days supply | Qty: 1 | Fill #1

## 2023-02-09 MED FILL — TRESIBA FLEXTOUCH U-100 INSULIN 100 UNIT/ML (3 ML) SUBCUTANEOUS PEN: SUBCUTANEOUS | 50 days supply | Qty: 15 | Fill #1

## 2023-02-09 MED FILL — LISINOPRIL 20 MG TABLET: ORAL | 30 days supply | Qty: 30 | Fill #3

## 2023-02-19 ENCOUNTER — Institutional Professional Consult (permissible substitution): Admit: 2023-02-19 | Discharge: 2023-02-20 | Payer: PRIVATE HEALTH INSURANCE

## 2023-03-09 ENCOUNTER — Ambulatory Visit
Admit: 2023-03-09 | Discharge: 2023-03-09 | Payer: PRIVATE HEALTH INSURANCE | Attending: "Endocrinology | Primary: "Endocrinology

## 2023-03-09 ENCOUNTER — Ambulatory Visit: Admit: 2023-03-09 | Discharge: 2023-03-09 | Payer: PRIVATE HEALTH INSURANCE

## 2023-03-09 DIAGNOSIS — E119 Type 2 diabetes mellitus without complications: Principal | ICD-10-CM

## 2023-03-09 MED ORDER — INSULIN DEGLUDEC (U-100) 100 UNIT/ML (3 ML) SUBCUTANEOUS PEN
Freq: Every day | SUBCUTANEOUS | 3 refills | 111 days | Status: CP
Start: 2023-03-09 — End: ?
  Filled 2023-03-30: qty 30, 83d supply, fill #0

## 2023-03-09 MED ORDER — INSULIN ASPART (U-100) 100 UNIT/ML (3 ML) SUBCUTANEOUS PEN
3 refills | 0 days | Status: CP
Start: 2023-03-09 — End: ?
  Filled 2023-03-30: qty 15, 50d supply, fill #0

## 2023-03-15 ENCOUNTER — Ambulatory Visit: Admit: 2023-03-15 | Discharge: 2023-03-15 | Payer: PRIVATE HEALTH INSURANCE

## 2023-03-15 DIAGNOSIS — C187 Malignant neoplasm of sigmoid colon: Principal | ICD-10-CM

## 2023-03-15 DIAGNOSIS — E119 Type 2 diabetes mellitus without complications: Principal | ICD-10-CM

## 2023-03-15 DIAGNOSIS — H532 Diplopia: Principal | ICD-10-CM

## 2023-03-15 DIAGNOSIS — H5000 Unspecified esotropia: Principal | ICD-10-CM

## 2023-03-15 DIAGNOSIS — H518 Other specified disorders of binocular movement: Principal | ICD-10-CM

## 2023-03-20 DIAGNOSIS — C187 Malignant neoplasm of sigmoid colon: Principal | ICD-10-CM

## 2023-03-20 MED ORDER — ONDANSETRON 8 MG DISINTEGRATING TABLET
ORAL_TABLET | 2 refills | 0 days
Start: 2023-03-20 — End: ?

## 2023-03-20 MED ORDER — ROSUVASTATIN 10 MG TABLET
ORAL_TABLET | Freq: Every evening | ORAL | 3 refills | 90 days
Start: 2023-03-20 — End: ?

## 2023-03-20 MED ORDER — ATENOLOL 25 MG TABLET
Freq: Every evening | ORAL | 0 refills | 0 days
Start: 2023-03-20 — End: ?

## 2023-03-20 MED ORDER — RIZATRIPTAN 10 MG DISINTEGRATING TABLET
Freq: Every day | 0 refills | 0 days | PRN
Start: 2023-03-20 — End: ?

## 2023-03-22 ENCOUNTER — Other Ambulatory Visit: Payer: Self-pay

## 2023-03-22 DIAGNOSIS — G43709 Chronic migraine without aura, not intractable, without status migrainosus: Secondary | ICD-10-CM

## 2023-03-22 MED ORDER — ROSUVASTATIN 10 MG TABLET
ORAL_TABLET | Freq: Every evening | ORAL | 1 refills | 90 days | Status: CP
Start: 2023-03-22 — End: ?

## 2023-03-22 MED ORDER — ONDANSETRON 8 MG DISINTEGRATING TABLET
ORAL_TABLET | Freq: Three times a day (TID) | ORAL | 2 refills | 10 days | Status: CP | PRN
Start: 2023-03-22 — End: ?

## 2023-03-22 MED ORDER — BOTOX 200 UNITS IJ SOLR: INTRAMUSCULAR | 3 refills | Status: AC

## 2023-03-23 MED FILL — DULOXETINE 30 MG CAPSULE,DELAYED RELEASE: ORAL | 30 days supply | Qty: 90 | Fill #1

## 2023-03-24 ENCOUNTER — Other Ambulatory Visit: Payer: Self-pay | Admitting: Neurology

## 2023-03-24 ENCOUNTER — Encounter: Payer: Self-pay | Admitting: Neurology

## 2023-03-24 DIAGNOSIS — M47816 Spondylosis without myelopathy or radiculopathy, lumbar region: Secondary | ICD-10-CM

## 2023-03-24 MED ORDER — RIZATRIPTAN 10 MG DISINTEGRATING TABLET
ORAL_TABLET | Freq: Every day | 2 refills | 60 days | Status: CP | PRN
Start: 2023-03-24 — End: ?

## 2023-03-24 MED ORDER — ATENOLOL 25 MG TABLET
ORAL_TABLET | Freq: Every evening | ORAL | 3 refills | 90 days | Status: CP
Start: 2023-03-24 — End: 2024-03-23
  Filled 2023-03-30: qty 30, 30d supply, fill #0

## 2023-03-26 ENCOUNTER — Ambulatory Visit: Admit: 2023-03-26 | Discharge: 2023-03-27 | Payer: PRIVATE HEALTH INSURANCE

## 2023-03-26 ENCOUNTER — Telehealth: Admit: 2023-03-26 | Discharge: 2023-03-27 | Payer: PRIVATE HEALTH INSURANCE

## 2023-03-26 DIAGNOSIS — C187 Malignant neoplasm of sigmoid colon: Principal | ICD-10-CM

## 2023-03-30 ENCOUNTER — Other Ambulatory Visit: Payer: Self-pay | Admitting: Neurology

## 2023-03-30 ENCOUNTER — Encounter: Payer: Self-pay | Admitting: Neurology

## 2023-03-30 DIAGNOSIS — M47816 Spondylosis without myelopathy or radiculopathy, lumbar region: Secondary | ICD-10-CM

## 2023-03-30 NOTE — Telephone Encounter (Signed)
I would be fine to write it as the ablation.  It does save time and money when there is a high likelihood that it could help.  I thought I wrote it that way but do they need me to reorder it?

## 2023-04-01 ENCOUNTER — Telehealth: Payer: Self-pay | Admitting: Neurology

## 2023-04-01 NOTE — Telephone Encounter (Signed)
New auth needed before 04/26/23 Botox appt. Faxed contiuation form to [employer] at [phone].

## 2023-04-07 MED FILL — TRULICITY 4.5 MG/0.5 ML SUBCUTANEOUS PEN INJECTOR: SUBCUTANEOUS | 28 days supply | Qty: 2 | Fill #1

## 2023-04-12 ENCOUNTER — Other Ambulatory Visit: Payer: Self-pay | Admitting: Neurology

## 2023-04-12 ENCOUNTER — Ambulatory Visit
Admission: RE | Admit: 2023-04-12 | Discharge: 2023-04-12 | Disposition: A | Discharge status: Home / Self Care | Payer: BC Managed Care – PPO | Source: Ambulatory Visit | Attending: Neurology | Admitting: Neurology

## 2023-04-12 DIAGNOSIS — M47816 Spondylosis without myelopathy or radiculopathy, lumbar region: Secondary | ICD-10-CM

## 2023-04-12 MED ORDER — MIDAZOLAM HCL 2 MG/2ML IJ SOLN
1.0000 mg | INTRAMUSCULAR | Status: DC | PRN
  Administered 2023-04-12 (×4): 1 mg via INTRAVENOUS

## 2023-04-12 MED ORDER — FENTANYL CITRATE PF 50 MCG/ML IJ SOSY
25.0000 ug | PREFILLED_SYRINGE | INTRAMUSCULAR | Status: DC | PRN
  Administered 2023-04-12: 25 ug via INTRAVENOUS
  Administered 2023-04-12: 50 ug via INTRAVENOUS
  Administered 2023-04-12: 25 ug via INTRAVENOUS

## 2023-04-12 MED ORDER — SODIUM CHLORIDE 0.9 % IV SOLN: INTRAVENOUS | Status: DC

## 2023-04-12 MED ORDER — METHYLPREDNISOLONE ACETATE 40 MG/ML INJ SUSP (RADIOLOG
80.0000 mg | Freq: Once | INTRAMUSCULAR | Status: AC
  Administered 2023-04-12: 80 mg via INTRALESIONAL

## 2023-04-12 MED ORDER — KETOROLAC TROMETHAMINE 30 MG/ML IJ SOLN
30.0000 mg | Freq: Once | INTRAMUSCULAR | Status: AC
  Administered 2023-04-12: 30 mg via INTRAVENOUS

## 2023-04-12 MED FILL — BD ULTRA-FINE NANO PEN NEEDLE 32 GAUGE X 5/32" (4 MM): 67 days supply | Qty: 200 | Fill #1

## 2023-04-12 MED FILL — LISINOPRIL 20 MG TABLET: ORAL | 30 days supply | Qty: 30 | Fill #4

## 2023-04-12 MED FILL — DEXCOM G7 SENSOR DEVICE: 90 days supply | Qty: 9 | Fill #1

## 2023-04-12 NOTE — Discharge Instructions (Signed)
Radio Frequency Ablation Post Procedure Discharge Instructions ? ?May resume a regular diet and any medications that you routinely take (including pain medications). ?No driving day of procedure. ?Upon discharge go home and rest for at least 4 hours.  May use an ice pack as needed to injection sites on back. ?Remove bandades later, today. ? ? ? ?Please contact our office at [phone] for the following symptoms: ? ?Fever greater than 100 degrees ?Increased swelling, pain, or redness at injection site. ? ? ?Thank you for visiting [facility].  ?

## 2023-04-12 NOTE — Progress Notes (Signed)
Pt back in nursing recovery area. Pt still drowsy from procedure but will wake up when spoken to. Pt follows commands, talks in complete sentences and has no complaints at this time. Pt will be monitored until discharged by Radiologist.   

## 2023-04-15 ENCOUNTER — Telehealth: Admit: 2023-04-15 | Discharge: 2023-04-16 | Payer: PRIVATE HEALTH INSURANCE

## 2023-04-15 ENCOUNTER — Other Ambulatory Visit: Payer: Self-pay | Admitting: Neurology

## 2023-04-15 DIAGNOSIS — G4700 Insomnia, unspecified: Secondary | ICD-10-CM | POA: Insufficient documentation

## 2023-04-15 DIAGNOSIS — F411 Generalized anxiety disorder: Principal | ICD-10-CM

## 2023-04-15 MED ORDER — HYDROXYZINE HCL 25 MG TABLET
ORAL_TABLET | 0 refills | 0 days | Status: CP
Start: 2023-04-15 — End: ?

## 2023-04-15 MED ORDER — HYDROXYZINE HCL 10 MG/5 ML ORAL SOLUTION
0 refills | 0 days
Start: 2023-04-15 — End: ?

## 2023-04-16 MED ORDER — HYDROXYZINE PAMOATE 25 MG CAPSULE
ORAL_CAPSULE | 1 refills | 0 days | Status: CP
Start: 2023-04-16 — End: ?

## 2023-04-21 MED FILL — DULOXETINE 30 MG CAPSULE,DELAYED RELEASE: ORAL | 30 days supply | Qty: 90 | Fill #2

## 2023-04-22 ENCOUNTER — Encounter: Payer: Self-pay | Admitting: Neurology

## 2023-04-22 ENCOUNTER — Telehealth: Payer: Self-pay | Admitting: Neurology

## 2023-04-22 NOTE — Telephone Encounter (Signed)
Pt also sent mychart. Replied to [employer].

## 2023-04-22 NOTE — Telephone Encounter (Signed)
Pt reports that she has an ill family member that is close to the end of life.  Pt asked to r/s her Botox, pt was told that Dr Epimenio Foot does not have anything available anytime soon.  Pt declined scheduling with an NP.  Pt said she will just message Dr Bonnita Hollow  RN and ended the call.

## 2023-04-26 ENCOUNTER — Ambulatory Visit: Payer: BC Managed Care – PPO | Admitting: Neurology

## 2023-05-03 ENCOUNTER — Ambulatory Visit (INDEPENDENT_AMBULATORY_CARE_PROVIDER_SITE_OTHER): Payer: BC Managed Care – PPO | Admitting: Neurology

## 2023-05-03 VITALS — BP 152/103 | HR 93 | Ht 66.0 in

## 2023-05-03 DIAGNOSIS — H532 Diplopia: Secondary | ICD-10-CM | POA: Diagnosis not present

## 2023-05-03 DIAGNOSIS — M542 Cervicalgia: Secondary | ICD-10-CM

## 2023-05-03 DIAGNOSIS — G43709 Chronic migraine without aura, not intractable, without status migrainosus: Secondary | ICD-10-CM

## 2023-05-03 DIAGNOSIS — M47816 Spondylosis without myelopathy or radiculopathy, lumbar region: Secondary | ICD-10-CM | POA: Diagnosis not present

## 2023-05-03 DIAGNOSIS — G4733 Obstructive sleep apnea (adult) (pediatric): Secondary | ICD-10-CM

## 2023-05-03 MED ORDER — OXYCODONE-ACETAMINOPHEN 10-325 MG PO TABS: 1.0000 | ORAL_TABLET | Freq: Three times a day (TID) | ORAL | 0 refills | Status: DC | PRN

## 2023-05-03 MED ORDER — PYRIDOSTIGMINE BROMIDE 60 MG PO TABS: 60.0000 mg | ORAL_TABLET | Freq: Three times a day (TID) | ORAL | 5 refills | Status: DC

## 2023-05-03 MED ORDER — KETOROLAC TROMETHAMINE 60 MG/2ML IM SOLN
60.0000 mg | Freq: Once | INTRAMUSCULAR | Status: AC
Start: 2023-05-03 — End: 2023-05-03
  Administered 2023-05-03: 60 mg via INTRAMUSCULAR

## 2023-05-03 NOTE — Progress Notes (Signed)
[facility]  PATIENT: Pamela Murphy DOB: [birth year]  REASON FOR VISIT: Chronic Migraine headaches   HISTORICAL  CHIEF COMPLAINT:  Chief Complaint  Patient presents with   Follow-up    RM 10, alone. Last seen 01/25/23. Here for Botox.     HISTORY OF PRESENT ILLNESS:  Pamela Murphy is a 52 y.o. woman with a chronic migraine disorder, obstructive sleep apnea and abnormal MRI of the brain.   Update 05/03/2023 She is still experiencing diplopia that started in 2023, concurrent with chemo (oxiplatin and capectotabine).   Diplopia iis binocular and often worse on lateral gaze but can be present at primary gaze (esp with driving so often closes one eye).   It fluctuates.  She has not had ptosis.  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  Sometimes, the diplopia a triggers a  migraine.    She only drives short distance.  Chronic migraine headaches continues to do well on Botox injections every 3 months.  Sometimes she notes more headaches the last 2 weeks of each cycle.  Due to having mild ptosis in the past a smaller amount is placed into the frontalis muscles.   HA's are doing much better than they did a couple years ago..    When migraine occurs she  takes nothing or a triptan and Percocet if pain does not improve.    Botox also has helped the neck pain some.    She has OSA and is on CPAP and tolerates it well.    Download last year showed 100% compliance with excellent efficacy (AHI of 0.8).  She sleeps better when she uses CPAP than when she does not.  She feels refreshed in the morning.  She just had the left MB RFA done and LBP is better   MRI of the lumbar spine has shown left greater than right facet hypertrophy at L4-L5 and L5-S1.  She has colon cancer and had a partial resection resection.   She did oxiplatin and capectotabine.   No liver met's.   She has 2 small nodules in lung nit felt to be cancer.  . She has had  repeat cancer markers and they are fine.   While receiving chemotherapy she did have some numbness in her feet that has since improved.  However, she has persistent left lateral plantar region.  There is no weakness or pain noted.     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    She did just start menopause.   She takes tizanidine.  Initially it helped her sleep but now even 2 pills has not helped much.      Trazodone had not helped 10-15 years ago.   She takes gabapentin 300 mgat night as well.    She has RLS, worse on the left.     Migraine history:  She has a long history of chronic migraines that were occurring almost every day (25-28 days/month) for 4 or more hours a day. Multiple medications were tried for prophylactic and abortive therapies with suboptimal response.    In the past, she has failed multiple medications including:  Antiepileptics (Topamax, Keppra, zonisamide), anti-depressants (Cymbalta, Prozac), multiple NSAID's, betas blockers (Inderal, atenolol), muscle relaxants (soma, tizanidine, cyclobenzaprine) in various combinations.   Once the headache occurs, she has tried triptan's with incomplete benefit much of the time. She has also tried opiates.   IM Toradol has been most effective  when one occurs.  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   I "undiagnosed her" around 2010 and she got a second opinion from Dr. Carlos American who also concurred.  MRI shows small round foci predominantly in the subcortical and deep white matter in a nonspecific manner.  Her last MRI of the brain was in 2016 and was stable compared to the previous ones.  She had had paresthesias and fatigue in the past that contributed to her diagnosis.   We have discussed that she has been stable times many years off therapy that MS is significantly less likely.   REVIEW OF SYSTEMS:  Constitutional: No fevers, chills, sweats, or change in  appetite Eyes: No visual changes, double vision, eye pain Ear, nose and throat: No hearing loss, ear pain, nasal congestion, sore throat Cardiovascular: No chest pain, palpitations Respiratory:  see above.  She has OSA. GastrointestinaI: No nausea, vomiting, diarrhea, abdominal pain, fecal incontinence Genitourinary:  No dysuria, urinary retention or frequency.  No nocturia.   Dysfunctional uterine bleeding Musculoskeletal:  notes low back pain that has been worse the last couple months and neck pain that is helped by Botox Integumentary: No rash, pruritus, skin lesions Neurological: as above Psychiatric: No depression at this time.  No anxiety Endocrine: No palpitations, diaphoresis, change in appetite, change in weigh or increased thirst Hematologic/Lymphatic:   She has anemia due to dysfunctional uterine bleeding. Allergic/Immunologic: No itchy/runny eyes, nasal congestion, recent allergic reactions, rashes  ALLERGIES: Allergies  Allergen Reactions   Metoclopramide Anxiety    Other Reaction(s): Not available   Cefaclor Swelling and Rash     neck swelling   Prochlorperazine Maleate Rash and Other (See Comments)     jittery;tachycardia   Bupropion Anxiety   Sulfamethoxazole-Trimethoprim Nausea Only   Tessalon Perles [Benzonatate] Rash    HOME MEDICATIONS: Outpatient Medications Prior to Visit  Medication Sig Dispense Refill   atenolol (TENORMIN) 25 MG tablet TAKE 1 TABLET (25 MG TOTAL) BY MOUTH DAILY. 90 tablet 3   botulinum toxin Type A (BOTOX) 200 units injection INJECT INTO THE MUSCLES BY PHYSICIAN EVERY 3 MONTHS FOR MIGRAINE HEADACHES. DISCARD UNUSED PORTION. 1 each 3   clonazePAM (KLONOPIN) 0.5 MG tablet Take 0.5 mg by mouth 2 (two) times daily as needed.     DULoxetine (CYMBALTA) 60 MG capsule Take 90 mg by mouth daily. 90 mg tablet daily     gabapentin (NEURONTIN) 300 MG capsule TAKE 1-2 CAPSULES BY MOUTH AT BEDTIME 60 capsule 11   Multiple Vitamins-Minerals (MULTIVITAMIN  WITH MINERALS) tablet Take by mouth.     ondansetron (ZOFRAN-ODT) 8 MG disintegrating tablet TAKE 1 TABLET BY MOUTH EVERY 8 HOURS AS NEEDED FOR NAUSEA AND VOMITING 18 tablet 4   PNEUMOCOCCAL VAC POLYVALENT IJ Inject as directed. Received in January 2021     rizatriptan (MAXALT-MLT) 10 MG disintegrating tablet TAKE 1 TABLET BY MOUTH AS NEEDED FOR MIGRAINE. MAY REPEAT IN 2 HOURS IF NEEDED 12 tablet 11   tiZANidine (ZANAFLEX) 4 MG tablet TAKE 1 TABLET BY MOUTH 3 TIMES DAILY. 270 tablet 3   oxyCODONE-acetaminophen (PERCOCET) 10-325 MG tablet Take 1 tablet by mouth every 8 (eight) hours as needed for pain. 90 tablet 0   pantoprazole (PROTONIX) 40 MG tablet Take 40 mg by mouth 2 (two) times daily.      No facility-administered medications prior to visit.    PAST MEDICAL HISTORY: Past Medical History:  Diagnosis Date  Headache    Hypertension    Type 2 diabetes mellitus (HCC) 06/08/2018   Vision abnormalities     PAST SURGICAL HISTORY: Past Surgical History:  Procedure Laterality Date   SINUS IRRIGATION      FAMILY HISTORY: Family History  Problem Relation Age of Onset   Breast cancer Mother    Melanoma Mother    Atrial fibrillation Mother    Supraventricular tachycardia Mother    Heart attack Father    Hypertension Father    Hyperlipidemia Father     SOCIAL HISTORY:  Social History   Socioeconomic History   Marital status: Married    Spouse name: Not on file   Number of children: Not on file   Years of education: Not on file   Highest education level: Not on file  Occupational History    Comment: full time  Tobacco Use   Smoking status: Never   Smokeless tobacco: Never  Substance and Sexual Activity   Alcohol use: No    Alcohol/week: 0.0 standard drinks of alcohol    Comment: Rare   Drug use: No   Sexual activity: Not on file  Other Topics Concern   Not on file  Social History Narrative   Lives with spouse   Right Handed   Drinks 1-2 cups caffeine daily    Social Determinants of Health   Financial Resource Strain: Not on file  Food Insecurity: Not on file  Transportation Needs: Not on file  Physical Activity: Not on file  Stress: Not on file  Social Connections: Not on file  Intimate Partner Violence: Not on file     PHYSICAL EXAM  Vitals:   05/03/23 1450  BP: (!) 152/103  Pulse: 93  Height: 5\' 6"  (1.676 m)      Body mass index is 42.85 kg/m.   General: The patient is well-developed and well-nourished and in no acute distress   Musculoskeletal:   Her neck has a good ROM. She has no neck pain.   Mildly reduced left shoulder range of motion (active).  Neurologic Exam  Mental status: The patient is alert and oriented x 3 at the time of the examination.  Speech is normal.  Cranial nerves: She has diplopia that increases to lateral gaze, R>L.  Slight dysconjugate on right gaze, none on left gaze.  No ptosis.    Facial strength and sensation was normal.  Trapezius strength was normal.  No obvious hearing deficits are noted.  Motor:   Muscle tone and bulk is normal. She has normal strength.    Sensory: There was intact sensation to touch in the arms and legs.  Gait and station: Station and gait are normal.     Deep tendon reflexes are normal and symmetric 2 in the arms and 1 in the legs.       ASSESSMENT AND PLAN    1. Chronic migraine w/o aura w/o status migrainosus, not intractable   2. Neck pain   3. Facet syndrome, lumbar   4. Diplopia   5. Obstructive sleep apnea      1.  We will inject 155 units Botox (45 units wasted) :   Frontalis (2.5 U x 4), nasalis/corrugators (2.5 U x 3), temporalis  (5 U x 10 ), occipitalis (5 U x 6), splenius capitis (10 U x 2), trapezius (10 U x 2),   C6C7 paraspinal muscles (10 U x 2),    Toradol 60 mg IM x 1  2.  We did discuss  stopping the Botox and switching to an anti-CGRP agent in case the toxin is complicating the diagnosis of her diplopia.  She has not had ptosis with  the last 2 injections.     She will take Maxalt and/or Percocet if a more severe migraine occurs.  . 3.   Continue to use CPAP nightly for OSA.  4.   radiofrequency ablations for her L4-L5 and L5-S1 facet hypertrophy if low back pain worsens again.  She has had benefit for about 9 months when these are done.    Continue gabapentin to 600 mg at night and can continue tizanidine 5.  Diplopia continues to be a problem for her.  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  She has not had ptosis which is usually seen with this diagnosis though the variability and worsening when more tired could be consistent.  Because she has had Botox injections, single-fiber EMG could have increased risk of false positives, especially if facial muscles are tested.  Therefore, I will hold off on recommending this test.  We will do a trial of pyridostigmine to see if she gets a benefit. 6.  Return to see me in about 3 months for the next Botox injection, or sooner if she has new or worsening neurologic symptoms.     Jahzara Slattery A. Epimenio Foot, MD, PhD 05/03/2023, 4:35 PM Certified in Neurology, Clinical Neurophysiology, Sleep Medicine, Pain Medicine and Neuroimaging  [facility] [address], [address] [place], [place] [address] [phone]

## 2023-05-06 ENCOUNTER — Encounter: Payer: Self-pay | Admitting: Neurology

## 2023-05-18 ENCOUNTER — Telehealth: Admit: 2023-05-18 | Discharge: 2023-05-19 | Payer: PRIVATE HEALTH INSURANCE

## 2023-05-18 DIAGNOSIS — F419 Anxiety disorder, unspecified: Principal | ICD-10-CM

## 2023-05-18 DIAGNOSIS — G4700 Insomnia, unspecified: Principal | ICD-10-CM

## 2023-05-20 DIAGNOSIS — G4700 Insomnia, unspecified: Principal | ICD-10-CM

## 2023-05-20 DIAGNOSIS — F411 Generalized anxiety disorder: Principal | ICD-10-CM

## 2023-05-20 MED ORDER — HYDROXYZINE PAMOATE 25 MG CAPSULE
ORAL_CAPSULE | 1 refills | 0 days
Start: 2023-05-20 — End: ?

## 2023-05-21 MED ORDER — HYDROXYZINE PAMOATE 25 MG CAPSULE
ORAL_CAPSULE | 1 refills | 0 days | Status: CP
Start: 2023-05-21 — End: ?

## 2023-05-26 ENCOUNTER — Ambulatory Visit: Admit: 2023-05-26 | Discharge: 2023-05-26 | Payer: PRIVATE HEALTH INSURANCE

## 2023-05-26 DIAGNOSIS — Z6841 Body Mass Index (BMI) 40.0 and over, adult: Principal | ICD-10-CM

## 2023-05-26 DIAGNOSIS — H532 Diplopia: Principal | ICD-10-CM

## 2023-05-26 DIAGNOSIS — E119 Type 2 diabetes mellitus without complications: Principal | ICD-10-CM

## 2023-05-26 DIAGNOSIS — C187 Malignant neoplasm of sigmoid colon: Principal | ICD-10-CM

## 2023-05-26 DIAGNOSIS — G7000 Myasthenia gravis without (acute) exacerbation: Principal | ICD-10-CM

## 2023-06-04 MED FILL — NOVOLOG FLEXPEN U-100 INSULIN ASPART 100 UNIT/ML (3 ML) SUBCUTANEOUS: SUBCUTANEOUS | 50 days supply | Qty: 15 | Fill #1

## 2023-06-04 MED FILL — LISINOPRIL 20 MG TABLET: ORAL | 30 days supply | Qty: 30 | Fill #5

## 2023-06-04 MED FILL — DULOXETINE 30 MG CAPSULE,DELAYED RELEASE: ORAL | 30 days supply | Qty: 90 | Fill #3

## 2023-06-04 MED FILL — TRESIBA FLEXTOUCH U-100 INSULIN 100 UNIT/ML (3 ML) SUBCUTANEOUS PEN: SUBCUTANEOUS | 83 days supply | Qty: 30 | Fill #1

## 2023-06-08 MED ORDER — OZEMPIC 2 MG/DOSE (8 MG/3 ML) SUBCUTANEOUS PEN INJECTOR
SUBCUTANEOUS | 11 refills | 28 days | Status: CP
Start: 2023-06-08 — End: 2024-06-07

## 2023-06-24 ENCOUNTER — Telehealth: Admit: 2023-06-24 | Discharge: 2023-06-25 | Payer: PRIVATE HEALTH INSURANCE

## 2023-06-24 DIAGNOSIS — C187 Malignant neoplasm of sigmoid colon: Principal | ICD-10-CM

## 2023-07-06 ENCOUNTER — Encounter: Payer: Self-pay | Admitting: Neurology

## 2023-07-07 ENCOUNTER — Other Ambulatory Visit: Payer: Self-pay | Admitting: Neurology

## 2023-07-07 DIAGNOSIS — M47816 Spondylosis without myelopathy or radiculopathy, lumbar region: Secondary | ICD-10-CM

## 2023-07-09 ENCOUNTER — Telehealth: Admit: 2023-07-09 | Discharge: 2023-07-10 | Payer: PRIVATE HEALTH INSURANCE

## 2023-07-09 DIAGNOSIS — G4700 Insomnia, unspecified: Principal | ICD-10-CM

## 2023-07-09 DIAGNOSIS — F411 Generalized anxiety disorder: Principal | ICD-10-CM

## 2023-07-09 MED FILL — LISINOPRIL 20 MG TABLET: ORAL | 30 days supply | Qty: 30 | Fill #6

## 2023-07-22 NOTE — Discharge Instructions (Signed)
Radio Frequency Ablation Post Procedure Discharge Instructions ? ?May resume a regular diet and any medications that you routinely take (including pain medications). ?No driving day of procedure. ?Upon discharge go home and rest for at least 4 hours.  May use an ice pack as needed to injection sites on back. ?Remove bandades later, today. ? ? ? ?Please contact our office at [phone] for the following symptoms: ? ?Fever greater than 100 degrees ?Increased swelling, pain, or redness at injection site. ? ? ?Thank you for visiting [facility].  ?

## 2023-07-23 ENCOUNTER — Ambulatory Visit
Admission: RE | Admit: 2023-07-23 | Discharge: 2023-07-23 | Disposition: A | Discharge status: Home / Self Care | Payer: BC Managed Care – PPO | Source: Ambulatory Visit | Attending: Neurology | Admitting: Neurology

## 2023-07-23 ENCOUNTER — Other Ambulatory Visit: Payer: Self-pay | Admitting: Neurology

## 2023-07-23 DIAGNOSIS — M47816 Spondylosis without myelopathy or radiculopathy, lumbar region: Secondary | ICD-10-CM

## 2023-07-23 DIAGNOSIS — F411 Generalized anxiety disorder: Principal | ICD-10-CM

## 2023-07-23 MED ORDER — DULOXETINE 60 MG CAPSULE,DELAYED RELEASE
ORAL_CAPSULE | Freq: Every day | ORAL | 3 refills | 90 days
Start: 2023-07-23 — End: ?

## 2023-07-23 MED ORDER — MIDAZOLAM HCL 2 MG/2ML IJ SOLN
1.0000 mg | INTRAMUSCULAR | Status: DC | PRN
  Administered 2023-07-23 (×4): 1 mg via INTRAVENOUS

## 2023-07-23 MED ORDER — METHYLPREDNISOLONE ACETATE 40 MG/ML INJ SUSP (RADIOLOG
120.0000 mg | Freq: Once | INTRAMUSCULAR | Status: AC
  Administered 2023-07-23: 120 mg via INTRALESIONAL

## 2023-07-23 MED ORDER — SODIUM CHLORIDE 0.9 % IV SOLN: INTRAVENOUS | Status: DC

## 2023-07-23 MED ORDER — FENTANYL CITRATE PF 50 MCG/ML IJ SOSY
25.0000 ug | PREFILLED_SYRINGE | INTRAMUSCULAR | Status: DC | PRN
  Administered 2023-07-23: 50 ug via INTRAVENOUS
  Administered 2023-07-23 (×2): 25 ug via INTRAVENOUS

## 2023-07-23 MED ORDER — KETOROLAC TROMETHAMINE 30 MG/ML IJ SOLN
30.0000 mg | Freq: Once | INTRAMUSCULAR | Status: AC
  Administered 2023-07-23: 30 mg via INTRAVENOUS

## 2023-07-23 NOTE — Telephone Encounter (Signed)
Last seen on 05/03/23 Follow up scheduled on 07/28/23

## 2023-07-23 NOTE — Progress Notes (Signed)
Pt back in nursing recovery area. Pt still drowsy from procedure but will wake up when spoken to. Pt follows commands, talks in complete sentences and has no complaints at this time. Pt will be monitored until discharged by Radiologist.   

## 2023-07-27 ENCOUNTER — Ambulatory Visit
Admit: 2023-07-27 | Discharge: 2023-07-28 | Payer: PRIVATE HEALTH INSURANCE | Attending: "Endocrinology | Primary: "Endocrinology

## 2023-07-27 DIAGNOSIS — E119 Type 2 diabetes mellitus without complications: Principal | ICD-10-CM

## 2023-07-27 MED ORDER — DEXCOM G7 SENSOR DEVICE
3 refills | 0 days | Status: CP
Start: 2023-07-27 — End: ?

## 2023-07-28 ENCOUNTER — Ambulatory Visit (INDEPENDENT_AMBULATORY_CARE_PROVIDER_SITE_OTHER): Payer: BC Managed Care – PPO | Admitting: Neurology

## 2023-07-28 VITALS — BP 149/93 | HR 82 | Ht 65.0 in | Wt 263.0 lb

## 2023-07-28 DIAGNOSIS — H532 Diplopia: Secondary | ICD-10-CM

## 2023-07-28 DIAGNOSIS — G43709 Chronic migraine without aura, not intractable, without status migrainosus: Secondary | ICD-10-CM | POA: Diagnosis not present

## 2023-07-28 DIAGNOSIS — M47816 Spondylosis without myelopathy or radiculopathy, lumbar region: Secondary | ICD-10-CM

## 2023-07-28 DIAGNOSIS — G4733 Obstructive sleep apnea (adult) (pediatric): Secondary | ICD-10-CM

## 2023-07-28 DIAGNOSIS — C189 Malignant neoplasm of colon, unspecified: Secondary | ICD-10-CM

## 2023-07-28 DIAGNOSIS — G7000 Myasthenia gravis without (acute) exacerbation: Secondary | ICD-10-CM

## 2023-07-28 MED ORDER — ONABOTULINUMTOXINA 200 UNITS IJ SOLR
155.0000 [IU] | Freq: Once | INTRAMUSCULAR | Status: AC
Start: 2023-07-28 — End: ?

## 2023-07-28 NOTE — Progress Notes (Signed)
Botox- 200 units x 1 vial Lot: [id number] Expiration: 10/2025 NDC: 0347-4259-56  Bacteriostatic 0.9% Sodium Chloride- * mL  Lot: [id number] Expiration: 02/22/2024 NDC: 3329-5188-41  Dx: Y60.630 S/P  (this was addended) 07-29-2023 1001) by SYoung RN Drawn up by MD

## 2023-07-28 NOTE — Progress Notes (Addendum)
[facility]  PATIENT: Pamela Murphy DOB: May 21, 1971  REASON FOR VISIT: Chronic Migraine headaches   HISTORICAL  CHIEF COMPLAINT:  Chief Complaint  Patient presents with   Botulinum Toxin Injection    Rm 10, alone Botox consent signed.     HISTORY OF PRESENT ILLNESS:  Pamela Murphy is a 52 y.o. woman with a chronic migraine disorder, obstructive sleep apnea and abnormal MRI of the brain.   Update 07/28/2023 Diplopia started in 2023.  At that time, she was on chemotherapy for her colon cancer.  Diplopia iis binocular and often worse on lateral gaze but can be present at primary gaze (esp with driving so often closes one eye).   It fluctuates quite a bit and is usually worse as the day goes on..  She has not had ptosis.  She has seen Neuro-ophth at [facility] and they told her they thought she might have ocular MG.  Sometimes, the diplopia a triggers a  migraine.    She only drives short distance due to the diplopia..  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  She started pyridostigmine for possible myasthenia gravis.   She takes 60 mg po tid.  She notes some visual delusions/hallucination (as example sees her deceased dad)   She does think it helps some though  incompletely.   She has seen Neuro-ophthalmology (Dr. Margaree Mackintosh at [facility]) who had felt that myasthenia is a possibility.     She noted variable esotropia from visit to visit (was 10-12 first visit with her and 4-6 second visit).  We had tested acetylcholine receptor antibodies and they were negative.  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  Sometimes she notes more headaches the last 2 weeks of each cycle.  Due to having mild ptosis in the past a smaller amount is placed into the frontalis muscles.   HA's are doing much better than they did a couple years ago..    When migraine occurs she   takes nothing or a triptan and Percocet if pain does not improve.    Botox also has helped the neck pain some.    She has OSA and is on CPAP and tolerates it well.    Download last year showed 100% compliance with excellent efficacy (AHI of 0.8).  She sleeps better when she uses CPAP than when she does not.  She feels refreshed in the morning.  Low back pain has improved with radiofrequency ablations of the medial branches nerves.  MRI of the lumbar spine has shown left greater than right facet hypertrophy at L4-L5 and L5-S1.  She has colon cancer and had a partial resection resection.   She did oxiplatin and capectotabine.   No liver met's.   She has 2 small nodules in lung nit felt to be cancer.  . She has had repeat cancer markers and they are fine.   While receiving chemotherapy she did have some numbness in her feet that has since improved.  However, she has persistent left lateral plantar region.  There is no weakness or pain noted.     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    She did just start menopause.   She takes tizanidine.  Initially it helped her sleep but now even 2 pills has not helped much.      Trazodone had not  helped 10-15 years ago.   She takes gabapentin 300 mgat night as well.    She has RLS, worse on the left.     Migraine history:  She has a long history of chronic migraines that were occurring almost every day (25-28 days/month) for 4 or more hours a day. Multiple medications were tried for prophylactic and abortive therapies with suboptimal response.    In the past, she has failed multiple medications including:  Antiepileptics (Topamax, Keppra, zonisamide), anti-depressants (Cymbalta, Prozac), multiple NSAID's, betas blockers (Inderal, atenolol), muscle relaxants (soma, tizanidine, cyclobenzaprine) in various combinations.   Once the headache occurs, she has tried triptan's with incomplete benefit much of the time. She has also tried opiates.   IM Toradol has  been most effective when one occurs.  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   I "undiagnosed her" around 2010 and she got a second opinion from Dr. Carlos American who also concurred.  MRI shows small round foci predominantly in the subcortical and deep white matter in a nonspecific manner.  Her last MRI of the brain was in 2016 and was stable compared to the previous ones.  She had had paresthesias and fatigue in the past that contributed to her diagnosis.   We have discussed that she has been stable times many years off therapy that MS is significantly less likely.   REVIEW OF SYSTEMS:  Constitutional: No fevers, chills, sweats, or change in appetite Eyes: No visual changes.  She has diplopia.   Ear, nose and throat: No hearing loss, ear pain, nasal congestion, sore throat Cardiovascular: No chest pain, palpitations Respiratory:  see above.  She has OSA. GastrointestinaI: No nausea, vomiting, diarrhea, abdominal pain, fecal incontinence Genitourinary:  No dysuria, urinary retention or frequency.  No nocturia.   Dysfunctional uterine bleeding Musculoskeletal:  notes low back pain that has been worse the last couple months and neck pain that is helped by Botox Integumentary: No rash, pruritus, skin lesions Neurological: as above Psychiatric: No depression at this time.  No anxiety Endocrine: No palpitations, diaphoresis, change in appetite, change in weigh or increased thirst Hematologic/Lymphatic:   She has anemia due to dysfunctional uterine bleeding. Allergic/Immunologic: No itchy/runny eyes, nasal congestion, recent allergic reactions, rashes  ALLERGIES: Allergies  Allergen Reactions   Metoclopramide Anxiety    Other Reaction(s): Not available   Cefaclor Swelling and Rash     neck swelling   Prochlorperazine Maleate Rash and Other (See Comments)     jittery;tachycardia   Bupropion Anxiety   Sulfamethoxazole-Trimethoprim Nausea  Only   Tessalon Perles [Benzonatate] Rash    HOME MEDICATIONS: Outpatient Medications Prior to Visit  Medication Sig Dispense Refill   atenolol (TENORMIN) 25 MG tablet TAKE 1 TABLET (25 MG TOTAL) BY MOUTH DAILY. 90 tablet 3   botulinum toxin Type A (BOTOX) 200 units injection INJECT INTO THE MUSCLES BY PHYSICIAN EVERY 3 MONTHS FOR MIGRAINE HEADACHES. DISCARD UNUSED PORTION. 1 each 3   clonazePAM (KLONOPIN) 0.5 MG tablet Take 0.5 mg by mouth 2 (two) times daily as needed.     gabapentin (NEURONTIN) 300 MG capsule TAKE 1-2 CAPSULES BY MOUTH AT BEDTIME 60 capsule 11   Multiple Vitamins-Minerals (MULTIVITAMIN WITH MINERALS) tablet Take by mouth.     ondansetron (ZOFRAN-ODT) 8 MG disintegrating tablet TAKE 1 TABLET BY MOUTH EVERY 8 HOURS AS NEEDED FOR NAUSEA AND VOMITING 18 tablet 4   oxyCODONE-acetaminophen (PERCOCET) 10-325 MG tablet Take 1  tablet by mouth every 8 (eight) hours as needed for pain. 90 tablet 0   PNEUMOCOCCAL VAC POLYVALENT IJ Inject as directed. Received in January 2021     pyridostigmine (MESTINON) 60 MG tablet Take 1 tablet (60 mg total) by mouth 3 (three) times daily. 90 tablet 5   rizatriptan (MAXALT-MLT) 10 MG disintegrating tablet TAKE 1 TABLET BY MOUTH AS NEEDED FOR MIGRAINE. MAY REPEAT IN 2 HOURS IF NEEDED 12 tablet 11   tiZANidine (ZANAFLEX) 4 MG tablet TAKE 1 TABLET BY MOUTH THREE TIMES A DAY 270 tablet 0   DULoxetine (CYMBALTA) 60 MG capsule Take 90 mg by mouth daily. 90 mg tablet daily     pantoprazole (PROTONIX) 40 MG tablet Take 40 mg by mouth 2 (two) times daily.      No facility-administered medications prior to visit.    PAST MEDICAL HISTORY: Past Medical History:  Diagnosis Date   Headache    Hypertension    Type 2 diabetes mellitus (HCC) 06/08/2018   Vision abnormalities     PAST SURGICAL HISTORY: Past Surgical History:  Procedure Laterality Date   SINUS IRRIGATION      FAMILY HISTORY: Family History  Problem Relation Age of Onset   Breast  cancer Mother    Melanoma Mother    Atrial fibrillation Mother    Supraventricular tachycardia Mother    Heart attack Father    Hypertension Father    Hyperlipidemia Father     SOCIAL HISTORY:  Social History   Socioeconomic History   Marital status: Married    Spouse name: Not on file   Number of children: Not on file   Years of education: Not on file   Highest education level: Not on file  Occupational History    Comment: full time  Tobacco Use   Smoking status: Never   Smokeless tobacco: Never  Substance and Sexual Activity   Alcohol use: No    Alcohol/week: 0.0 standard drinks of alcohol    Comment: Rare   Drug use: No   Sexual activity: Not on file  Other Topics Concern   Not on file  Social History Narrative   Lives with spouse   Right Handed   Drinks 1-2 cups caffeine daily   Social Determinants of Health   Financial Resource Strain: Low Risk  (10/31/2021)   Received from [facility], [facility]   Overall Financial Resource Strain (CARDIA)    Difficulty of Paying Living Expenses: Not hard at all  Food Insecurity: No Food Insecurity (10/31/2021)   Received from [facility], [facility]   Hunger Vital Sign    Worried About Running Out of Food in the Last Year: Never true    Ran Out of Food in the Last Year: Never true  Transportation Needs: No Transportation Needs (09/07/2022)   Received from [facility], [facility]   [facility] - Transportation    Lack of Transportation (Medical): No    Lack of Transportation (Non-Medical): No  Physical Activity: Not on file  Stress: Not on file  Social Connections: Not on file  Intimate Partner Violence: Not At Risk (09/07/2022)   Received from [facility], [facility]   Humiliation, Afraid, Rape, and Kick questionnaire    Fear of Current or Ex-Partner: No    Emotionally Abused: No    Physically Abused: No    Sexually Abused: No     PHYSICAL EXAM  Vitals:   07/28/23 1559  BP:  Marland Kitchen)  149/93  Pulse: 82  Weight: 263 lb (119.3 kg)  Height: 5\' 5"  (1.651 m)       Body mass index is 43.77 kg/m.   General: The patient is well-developed and well-nourished and in no acute distress  Musculoskeletal:   Her neck has a good ROM. She has no neck pain.   Mildly reduced left shoulder range of motion (active).  Neurologic Exam  Mental status: The patient is alert and oriented x 3 at the time of the examination.  Speech is normal.  Cranial nerves: She has diplopia that increases to lateral gaze, R>L.  Gaze is disconjugate when she looks to the right more than when she looks to the left.  At primary gaze there is esotropia on the right.    No ptosis.    Facial strength and sensation was normal.  Trapezius strength was normal.  No obvious hearing deficits are noted.  Motor:   Muscle tone and bulk is normal. She has normal strength.    Sensory: There was intact sensation to touch in the arms and legs.  Gait and station: Station and gait are normal.     Deep tendon reflexes are normal and symmetric 2 in the arms and 1 in the legs.       ASSESSMENT AND PLAN    1. Chronic migraine w/o aura w/o status migrainosus, not intractable   2. Diplopia   3. Ocular myasthenia gravis (HCC)   4. Facet syndrome, lumbar   5. Obstructive sleep apnea   6. Malignant neoplasm of colon, unspecified part of colon (HCC)       1.  For the chronic migraine, inject 155 units Botox (45 units wasted) :   Frontalis (2.5 U x 4), nasalis/corrugators (2.5 U x 3), temporalis  (5 U x 10 ), occipitalis (5 U x 6), splenius capitis (10 U x 2), trapezius (10 U x 2),   C6C7 paraspinal muscles (10 U x 2),    Toradol 60 mg IM x 1  2.  We did discuss stopping the Botox and switching to an anti-CGRP agent in case the toxin is complicating the diagnosis of her diplopia.  She has not had ptosis with any recent injection.Marland Kitchen     She will take Maxalt and/or Percocet if a more severe migraine occurs.  . 3.    Continue to use CPAP nightly for OSA.   Current machine is > 55 years old.  We can order a new one but will need to do HST first. 4.   She will get radiofrequency ablations for her L4-L5 and L5-S1 facet hypertrophy if low back pain worsens again.  She has had benefit for about 9 months when these are done.    5.  Because the diplopia is variable and she has had some response to pyridostigmine, she could have seronegative ocular myasthenia gravis.  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  The acetylcholine receptor antibody test was negative.   Diplopia continues to be a problem for her and greatly affects her life.  Because she has had Botox injections, single-fiber EMG could have increased risk of false positives, especially if facial muscles are tested.  Therefore, I will hold off on recommending this test.  She will increase the pyridostigmine from 3 times daily to 4 times daily to see if symptoms improve.  If not better then I will recommend a trial of IVIG.  Due to her history of diabetes mellitus and cancer I would be reluctant to  have her do steroids or other biologic agents.. 6.  Return to see me in about 3 months for the next Botox injection, or sooner if she has new or worsening neurologic symptoms.   45-minute office visit with the majority of the time spent face-to-face for history and physical, discussion/counseling and decision-making.  Additional time with record review and documentation.   Ammon Muscatello A. Epimenio Foot, MD, PhD 07/28/2023, 5:21 PM Certified in Neurology, Clinical Neurophysiology, Sleep Medicine, Pain Medicine and Neuroimaging  [facility] [address] [place], [place] [address] [phone]

## 2023-08-04 ENCOUNTER — Other Ambulatory Visit: Payer: Self-pay | Admitting: Neurology

## 2023-08-04 DIAGNOSIS — Z79899 Other long term (current) drug therapy: Secondary | ICD-10-CM

## 2023-08-04 DIAGNOSIS — H532 Diplopia: Secondary | ICD-10-CM

## 2023-08-09 ENCOUNTER — Other Ambulatory Visit: Payer: Self-pay | Admitting: *Deleted

## 2023-08-09 ENCOUNTER — Telehealth: Payer: Self-pay | Admitting: *Deleted

## 2023-08-09 ENCOUNTER — Encounter: Payer: Self-pay | Admitting: Neurology

## 2023-08-09 DIAGNOSIS — Z79899 Other long term (current) drug therapy: Secondary | ICD-10-CM

## 2023-08-09 DIAGNOSIS — H532 Diplopia: Secondary | ICD-10-CM

## 2023-08-09 NOTE — Telephone Encounter (Signed)
Message to pt if she had her lab drawn. So can tunr in IVIG form.

## 2023-08-09 NOTE — Telephone Encounter (Signed)
Patient called office, informed that she needs labs done prior to order being placed. A new order was placed , because old order could not be released. Pt lives in [place] and would have to drive 1 hour to this office. I explained she can go to [facility] in [place] now that order has been released. Pt verbalized she understood.

## 2023-08-10 ENCOUNTER — Encounter: Payer: Self-pay | Admitting: Neurology

## 2023-08-10 LAB — IGG, IGA, IGM
IgA/Immunoglobulin A, Serum: 306 mg/dL (ref 87–352)
IgG (Immunoglobin G), Serum: 1290 mg/dL (ref 586–1602)
IgM (Immunoglobulin M), Srm: 92 mg/dL (ref 26–217)

## 2023-08-10 NOTE — Telephone Encounter (Signed)
Results received per [facility].  IgA normal creatinine was ok.  Per Dr Epimenio Foot ok to send once IgA back and normal.  IVIG infusion orders to [facility].

## 2023-08-11 DIAGNOSIS — C187 Malignant neoplasm of sigmoid colon: Principal | ICD-10-CM

## 2023-08-11 MED ORDER — ONDANSETRON 8 MG DISINTEGRATING TABLET
ORAL_TABLET | Freq: Three times a day (TID) | ORAL | 2 refills | 10 days | Status: CP | PRN
Start: 2023-08-11 — End: ?
  Filled 2023-08-16: qty 18, 21d supply, fill #0

## 2023-08-13 ENCOUNTER — Ambulatory Visit: Admit: 2023-08-13 | Discharge: 2023-08-14 | Payer: PRIVATE HEALTH INSURANCE

## 2023-08-13 ENCOUNTER — Telehealth: Admit: 2023-08-13 | Discharge: 2023-08-14 | Payer: PRIVATE HEALTH INSURANCE

## 2023-08-13 DIAGNOSIS — E119 Type 2 diabetes mellitus without complications: Principal | ICD-10-CM

## 2023-08-13 DIAGNOSIS — I10 Essential (primary) hypertension: Principal | ICD-10-CM

## 2023-08-13 DIAGNOSIS — G4700 Insomnia, unspecified: Principal | ICD-10-CM

## 2023-08-13 DIAGNOSIS — F411 Generalized anxiety disorder: Principal | ICD-10-CM

## 2023-08-13 MED ORDER — SHINGRIX (PF) 50 MCG/0.5 ML INTRAMUSCULAR SUSPENSION, KIT
Freq: Once | INTRAMUSCULAR | 1 refills | 1 days | Status: CP
Start: 2023-08-13 — End: 2023-08-13

## 2023-08-13 MED ORDER — LISINOPRIL 20 MG TABLET
ORAL_TABLET | Freq: Every day | ORAL | 11 refills | 20 days | Status: CP
Start: 2023-08-13 — End: 2024-08-12
  Filled 2023-08-16: qty 45, 30d supply, fill #0

## 2023-08-16 MED FILL — ATENOLOL 25 MG TABLET: ORAL | 30 days supply | Qty: 30 | Fill #1

## 2023-08-16 MED FILL — TRESIBA FLEXTOUCH U-100 INSULIN 100 UNIT/ML (3 ML) SUBCUTANEOUS PEN: SUBCUTANEOUS | 83 days supply | Qty: 30 | Fill #2

## 2023-08-16 MED FILL — NOVOLOG FLEXPEN U-100 INSULIN ASPART 100 UNIT/ML (3 ML) SUBCUTANEOUS: SUBCUTANEOUS | 50 days supply | Qty: 15 | Fill #2

## 2023-08-16 MED FILL — DULOXETINE 30 MG CAPSULE,DELAYED RELEASE: ORAL | 30 days supply | Qty: 90 | Fill #4

## 2023-08-16 MED FILL — ROSUVASTATIN 10 MG TABLET: ORAL | 90 days supply | Qty: 90 | Fill #0

## 2023-08-17 MED ORDER — OZEMPIC 2 MG/DOSE (8 MG/3 ML) SUBCUTANEOUS PEN INJECTOR
SUBCUTANEOUS | 11 refills | 28 days
Start: 2023-08-17 — End: 2024-08-16

## 2023-08-17 MED FILL — RIZATRIPTAN 10 MG DISINTEGRATING TABLET: 30 days supply | Qty: 9 | Fill #0

## 2023-08-18 MED ORDER — OZEMPIC 2 MG/DOSE (8 MG/3 ML) SUBCUTANEOUS PEN INJECTOR
SUBCUTANEOUS | 11 refills | 28 days | Status: CP
Start: 2023-08-18 — End: 2024-08-17

## 2023-08-20 DIAGNOSIS — C187 Malignant neoplasm of sigmoid colon: Principal | ICD-10-CM

## 2023-08-24 ENCOUNTER — Encounter: Payer: Self-pay | Admitting: Neurology

## 2023-08-24 ENCOUNTER — Telehealth: Payer: Self-pay | Admitting: Neurology

## 2023-08-25 ENCOUNTER — Encounter: Payer: Self-pay | Admitting: Neurology

## 2023-08-26 ENCOUNTER — Other Ambulatory Visit: Payer: Self-pay | Admitting: Neurology

## 2023-08-26 NOTE — Telephone Encounter (Signed)
Error

## 2023-08-26 NOTE — Telephone Encounter (Signed)
Last seen on 05/03/23 for botox  Follow up scheduled on 10/27/23 for botox

## 2023-09-01 ENCOUNTER — Other Ambulatory Visit: Payer: Self-pay | Admitting: Neurology

## 2023-09-01 ENCOUNTER — Encounter: Payer: Self-pay | Admitting: Neurology

## 2023-09-01 ENCOUNTER — Other Ambulatory Visit: Payer: Self-pay | Admitting: *Deleted

## 2023-09-01 MED ORDER — OXYCODONE-ACETAMINOPHEN 10-325 MG PO TABS: 1.0000 | ORAL_TABLET | Freq: Three times a day (TID) | ORAL | 0 refills | Status: DC | PRN

## 2023-09-10 ENCOUNTER — Telehealth: Payer: Self-pay

## 2023-09-10 ENCOUNTER — Other Ambulatory Visit (HOSPITAL_COMMUNITY): Payer: Self-pay

## 2023-09-10 NOTE — Telephone Encounter (Signed)
*  [facility]  Pharmacy Patient Advocate Encounter   Received notification from CoverMyMeds that prior authorization for oxyCODONE-Acetaminophen 10-325MG  tablets  is required/requested.   Insurance verification completed.   The patient is insured through [facility] .   Per test claim: PA required; PA submitted to [employer] [facility] via CoverMyMeds Key/confirmation #/EOC [id number] Status is pending

## 2023-09-12 ENCOUNTER — Encounter: Payer: Self-pay | Admitting: Neurology

## 2023-09-13 ENCOUNTER — Other Ambulatory Visit: Payer: Self-pay | Admitting: *Deleted

## 2023-09-13 DIAGNOSIS — G4733 Obstructive sleep apnea (adult) (pediatric): Secondary | ICD-10-CM

## 2023-09-13 NOTE — Telephone Encounter (Signed)
Pt last seen 07/28/23. Will speak with MD to see if she needs appt/repeat study or if script can be written

## 2023-09-13 NOTE — Addendum Note (Signed)
Addended by: Despina Arias A on: 09/13/2023 11:59 AM   Modules accepted: Orders

## 2023-09-14 MED ORDER — PYRIDOSTIGMINE BROMIDE 60 MG PO TABS: 60.0000 mg | ORAL_TABLET | Freq: Four times a day (QID) | ORAL | 5 refills | Status: DC

## 2023-09-15 NOTE — Telephone Encounter (Signed)
Pharmacy Patient Advocate Encounter  Received notification from [employer] that Prior Authorization for oxyCODONE-Acetaminophen 10-325MG  tablets has been APPROVED from 09-10-2023 to 03-10-2024   PA #/Case ID/Reference #: [id number]

## 2023-09-20 ENCOUNTER — Encounter: Payer: Self-pay | Admitting: Neurology

## 2023-09-23 ENCOUNTER — Ambulatory Visit: Admit: 2023-09-23 | Discharge: 2023-09-23 | Payer: PRIVATE HEALTH INSURANCE

## 2023-09-23 DIAGNOSIS — Z79899 Other long term (current) drug therapy: Principal | ICD-10-CM

## 2023-09-23 DIAGNOSIS — G620 Drug-induced polyneuropathy: Principal | ICD-10-CM

## 2023-09-23 DIAGNOSIS — C187 Malignant neoplasm of sigmoid colon: Principal | ICD-10-CM

## 2023-09-23 DIAGNOSIS — T451X5A Adverse effect of antineoplastic and immunosuppressive drugs, initial encounter: Principal | ICD-10-CM

## 2023-09-24 ENCOUNTER — Telehealth: Admit: 2023-09-24 | Discharge: 2023-09-25 | Payer: PRIVATE HEALTH INSURANCE

## 2023-09-24 DIAGNOSIS — F411 Generalized anxiety disorder: Principal | ICD-10-CM

## 2023-09-24 MED ORDER — CLONAZEPAM 0.5 MG TABLET
ORAL_TABLET | Freq: Two times a day (BID) | ORAL | 1 refills | 23 days | Status: CP | PRN
Start: 2023-09-24 — End: ?

## 2023-09-29 ENCOUNTER — Ambulatory Visit: Admit: 2023-09-29 | Discharge: 2023-09-29 | Payer: PRIVATE HEALTH INSURANCE

## 2023-09-29 DIAGNOSIS — E119 Type 2 diabetes mellitus without complications: Principal | ICD-10-CM

## 2023-09-29 DIAGNOSIS — H5000 Unspecified esotropia: Principal | ICD-10-CM

## 2023-09-29 DIAGNOSIS — H532 Diplopia: Principal | ICD-10-CM

## 2023-10-04 MED FILL — LISINOPRIL 20 MG TABLET: ORAL | 90 days supply | Qty: 135 | Fill #1

## 2023-10-05 ENCOUNTER — Ambulatory Visit: Payer: BC Managed Care – PPO | Admitting: Neurology

## 2023-10-05 DIAGNOSIS — G4733 Obstructive sleep apnea (adult) (pediatric): Secondary | ICD-10-CM

## 2023-10-07 ENCOUNTER — Telehealth: Admit: 2023-10-07 | Discharge: 2023-10-08 | Payer: PRIVATE HEALTH INSURANCE

## 2023-10-11 MED ORDER — PREDNISONE 5 MG TABLET
ORAL_TABLET | ORAL | 0 refills | 58 days | Status: CP
Start: 2023-10-11 — End: 2023-12-08

## 2023-10-18 MED FILL — RIZATRIPTAN 10 MG DISINTEGRATING TABLET: 30 days supply | Qty: 9 | Fill #1

## 2023-10-25 ENCOUNTER — Ambulatory Visit: Admit: 2023-10-25 | Discharge: 2023-10-26 | Payer: PRIVATE HEALTH INSURANCE

## 2023-10-27 ENCOUNTER — Ambulatory Visit (INDEPENDENT_AMBULATORY_CARE_PROVIDER_SITE_OTHER): Payer: BC Managed Care – PPO | Admitting: Neurology

## 2023-10-27 DIAGNOSIS — G43709 Chronic migraine without aura, not intractable, without status migrainosus: Secondary | ICD-10-CM

## 2023-10-27 MED ORDER — ONABOTULINUMTOXINA 200 UNITS IJ SOLR
155.0000 [IU] | Freq: Once | INTRAMUSCULAR | Status: DC
Start: 2023-10-27 — End: 2023-10-27

## 2023-10-27 NOTE — Progress Notes (Signed)
**Note Pamela-Identified via Obfuscation** [facility]  PATIENT: Pamela Murphy DOB: [birth year]  REASON FOR VISIT: Chronic Migraine headaches   HISTORICAL  CHIEF COMPLAINT:  Chief Complaint  Patient presents with   Follow-up    Pt in room 10. Here for botox injection for migraines.     HISTORY OF PRESENT ILLNESS:  Pamela Murphy is a 52 y.o. woman with a chronic migraine disorder, obstructive sleep apnea and abnormal MRI of the brain.   Update 10/27/2023 Chronic migraine headaches continues to do well on Botox injections every 3 months.  Sometimes she notes more headaches the last 2 weeks of each cycle.  Due to having mild ptosis in the past a smaller amount is placed into the frontalis muscles.   HA's are doing much better than they did a couple years ago..    When migraine occurs she  takes nothing or a triptan and Percocet if pain does not improve.    Botox also has helped the neck pain some.    Diplopia started in 2023.  At that time, she was on chemotherapy for her colon cancer.  Diplopia iis binocular and often worse on lateral gaze but can be present at primary gaze (esp with driving so often closes one eye).   It fluctuates quite a bit and is usually worse as the day goes on..  She has not had ptosis. Sometimes, the diplopia a triggers a  migraine.  ..  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   She has prism glasses that help for driving but not with reading/computer work  She has seen Neuro-ophthalmology (Dr. Margaree Mackintosh at [facility]) who had felt that myasthenia is a possibility.     She noted variable esotropia from visit to visit (was 10-12 first visit with her and 4-6 second visit).    She started pyridostigmine for possible myasthenia gravis.   She takes 60 mg po tid.   She does think it helps some though  incompletely.    We had tested acetylcholine receptor antibodies and they were negative.  Her neuro-ophthalmologist tested her for LEMS  Due to her diabetes  mellitus, she is not a good candidate for steroid therapy for possible myasthenia gravis.  She has been started on prednisone 40 mg but her sugars shot up so she is planning on discontinuing.  I advised her to taper slowly if she does this.   We seen about the possibility of getting her approved for IVIG.  However, as she is acetylcholine receptor antibody negative this is difficult to do.  Because she has had Botox therapy, single-fiber EMG of facial muscles could be impaired.  Therefore, we discussed the possibility of stopping the Botox and having her try Aimovig or Ajovy or Emgality as that may also control her migraine headaches.  Then, sometime in May 2025 we could consider her doing single-fiber EMG (she would prefer to do at [facility] if we do this)  She has OSA and is on CPAP and tolerates it well.    Download last year showed 100% compliance with excellent efficacy (AHI of 0.8).  She sleeps better when she uses CPAP than when she does not.  She feels refreshed in the morning.  Low back pain has improved with radiofrequency ablations of the medial branches nerves.  MRI of the lumbar spine has shown left greater than right facet hypertrophy at L4-L5 and L5-S1.  She has colon cancer and had a partial resection  resection.   She did oxiplatin and capectotabine.   No liver met's.   She has 2 small nodules in lung nit felt to be cancer.  . She has had repeat cancer markers and they are fine.   While receiving chemotherapy she did have some numbness in her feet that has since improved.  However, she has persistent left lateral plantar region.  There is no weakness or pain noted.     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    She did just start menopause.   She takes tizanidine.  Initially it helped her sleep but now even 2 pills has not helped much.      Trazodone had not helped 10-15 years ago.   She takes gabapentin 300 mgat night as well.    She has RLS, worse on the left.     Migraine  history:  She has a long history of chronic migraines that were occurring almost every day (25-28 days/month) for 4 or more hours a day. Multiple medications were tried for prophylactic and abortive therapies with suboptimal response.    In the past, she has failed multiple medications including:  Antiepileptics (Topamax, Keppra, zonisamide), anti-depressants (Cymbalta, Prozac), multiple NSAID's, betas blockers (Inderal, atenolol), muscle relaxants (soma, tizanidine, cyclobenzaprine) in various combinations.   Once the headache occurs, she has tried triptan's with incomplete benefit much of the time. She has also tried opiates.   IM Toradol has been most effective when one occurs.  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   I "undiagnosed her" around 2010 and she got a second opinion from Dr. Carlos American who also concurred.  MRI shows small round foci predominantly in the subcortical and deep white matter in a nonspecific manner.  Her last MRI of the brain was in 2016 and was stable compared to the previous ones.  She had had paresthesias and fatigue in the past that contributed to her diagnosis.   We have discussed that she has been stable times many years off therapy that MS is significantly less likely.   REVIEW OF SYSTEMS:  Constitutional: No fevers, chills, sweats, or change in appetite Eyes: No visual changes.  She has diplopia.   Ear, nose and throat: No hearing loss, ear pain, nasal congestion, sore throat Cardiovascular: No chest pain, palpitations Respiratory:  see above.  She has OSA. GastrointestinaI: No nausea, vomiting, diarrhea, abdominal pain, fecal incontinence Genitourinary:  No dysuria, urinary retention or frequency.  No nocturia.   Dysfunctional uterine bleeding Musculoskeletal:  notes low back pain that has been worse the last couple months and neck pain that is helped by Botox Integumentary: No rash, pruritus, skin  lesions Neurological: as above Psychiatric: No depression at this time.  No anxiety Endocrine: No palpitations, diaphoresis, change in appetite, change in weigh or increased thirst Hematologic/Lymphatic:   She has anemia due to dysfunctional uterine bleeding. Allergic/Immunologic: No itchy/runny eyes, nasal congestion, recent allergic reactions, rashes  ALLERGIES: Allergies  Allergen Reactions   Metoclopramide Anxiety    Other Reaction(s): Not available   Cefaclor Swelling and Rash     neck swelling   Prochlorperazine Maleate Rash and Other (See Comments)     jittery;tachycardia   Bupropion Anxiety   Sulfamethoxazole-Trimethoprim Nausea Only   Tessalon Perles [Benzonatate] Rash    HOME MEDICATIONS: Outpatient Medications Prior to Visit  Medication Sig Dispense Refill   atenolol (TENORMIN) 25 MG tablet TAKE 1  TABLET (25 MG TOTAL) BY MOUTH DAILY. 90 tablet 3   botulinum toxin Type A (BOTOX) 200 units injection INJECT INTO THE MUSCLES BY PHYSICIAN EVERY 3 MONTHS FOR MIGRAINE HEADACHES. DISCARD UNUSED PORTION. 1 each 3   clonazePAM (KLONOPIN) 0.5 MG tablet Take 0.5 mg by mouth 2 (two) times daily as needed.     gabapentin (NEURONTIN) 300 MG capsule TAKE 1 TO 2 CAPSULES BY MOUTH AT BEDTIME 60 capsule 1   Multiple Vitamins-Minerals (MULTIVITAMIN WITH MINERALS) tablet Take by mouth.     ondansetron (ZOFRAN-ODT) 8 MG disintegrating tablet TAKE 1 TABLET BY MOUTH EVERY 8 HOURS AS NEEDED FOR NAUSEA AND VOMITING 18 tablet 4   oxyCODONE-acetaminophen (PERCOCET) 10-325 MG tablet Take 1 tablet by mouth every 8 (eight) hours as needed for pain. 90 tablet 0   PNEUMOCOCCAL VAC POLYVALENT IJ Inject as directed. Received in January 2021     pyridostigmine (MESTINON) 60 MG tablet Take 1 tablet (60 mg total) by mouth 4 (four) times daily. 90 tablet 5   rizatriptan (MAXALT-MLT) 10 MG disintegrating tablet TAKE 1 TABLET BY MOUTH AS NEEDED FOR MIGRAINE. MAY REPEAT IN 2 HOURS IF NEEDED 12 tablet 1    tiZANidine (ZANAFLEX) 4 MG tablet TAKE 1 TABLET BY MOUTH THREE TIMES A DAY 270 tablet 0   DULoxetine (CYMBALTA) 60 MG capsule Take 90 mg by mouth daily. 90 mg tablet daily     pantoprazole (PROTONIX) 40 MG tablet Take 40 mg by mouth 2 (two) times daily.      Facility-Administered Medications Prior to Visit  Medication Dose Route Frequency Provider Last Rate Last Admin   botulinum toxin Type A (BOTOX) injection 155 Units  155 Units Intramuscular Once Raylin Winer, Pearletha Furl, MD        PAST MEDICAL HISTORY: Past Medical History:  Diagnosis Date   Headache    Hypertension    Type 2 diabetes mellitus (HCC) 06/08/2018   Vision abnormalities     PAST SURGICAL HISTORY: Past Surgical History:  Procedure Laterality Date   SINUS IRRIGATION      FAMILY HISTORY: Family History  Problem Relation Age of Onset   Breast cancer Mother    Melanoma Mother    Atrial fibrillation Mother    Supraventricular tachycardia Mother    Heart attack Father    Hypertension Father    Hyperlipidemia Father     SOCIAL HISTORY:  Social History   Socioeconomic History   Marital status: Married    Spouse name: Not on file   Number of children: Not on file   Years of education: Not on file   Highest education level: Not on file  Occupational History    Comment: full time  Tobacco Use   Smoking status: Never   Smokeless tobacco: Never  Substance and Sexual Activity   Alcohol use: No    Alcohol/week: 0.0 standard drinks of alcohol    Comment: Rare   Drug use: No   Sexual activity: Not on file  Other Topics Concern   Not on file  Social History Narrative   Lives with spouse   Right Handed   Drinks 1-2 cups caffeine daily   Social Determinants of Health   Financial Resource Strain: Low Risk  (10/31/2021)   Received [facility], [facility]   Overall Financial Resource Strain (CARDIA)    Difficulty of Paying Living Expenses: Not hard at all  Food Insecurity: No Food Insecurity  (10/31/2021)   [facility], [facility]   Hunger Vital Sign    Worried About Running Out of Food in the Last Year: Never true    Ran Out of Food in the Last Year: Never true  Transportation Needs: No Transportation Needs (09/07/2022)   Received from [facility], [facility]   [facility] - Transportation    Lack of Transportation (Medical): No    Lack of Transportation (Non-Medical): No  Physical Activity: Not on file  Stress: Not on file  Social Connections: Not on file  Intimate Partner Violence: Not At Risk (09/07/2022)   Received from [facility], [facility]   Humiliation, Afraid, Rape, and Kick questionnaire    Fear of Current or Ex-Partner: No    Emotionally Abused: No    Physically Abused: No    Sexually Abused: No     PHYSICAL EXAM  There were no vitals filed for this visit.      There is no height or weight on file to calculate BMI.   General: The patient is well-developed and well-nourished and in no acute distress  Musculoskeletal:   Her neck has a good ROM. She has no neck pain.   Mildly reduced left shoulder range of motion (active).  Neurologic Exam  Mental status: The patient is alert and oriented x 3 at the time of the examination.  Speech is normal.  Cranial nerves: Today, she has diplopia that increases to lateral gaze, R>L.  Gaze is disconjugate when she looks to the right more than when she looks to the left.  There is right esotropia on primary gaze.    No current ptosis.    Facial strength and sensation was normal.  Trapezius strength was normal.  No obvious hearing deficits are noted.  Motor:   Muscle tone and bulk is normal. She has normal strength.    Sensory: There was intact sensation to touch in the arms and legs.  Gait and station: Station and gait are normal.     Deep tendon reflexes are normal and symmetric 2 in the arms and 1 in the legs.       ASSESSMENT AND PLAN    1. Chronic migraine w/o  aura w/o status migrainosus, not intractable       1.  We did discuss stopping the Botox and switching to an anti-CGRP agent in case the toxin is complicating the diagnosis of her diplopia.  She has not had ptosis with any recent injection..   For the chronic migraine, Ajovy  samples x 2 ([id number]   04/2024)   If she tolerates it, we will send in a script.    Toradol 60 mg IM x 1.  If this is unsuccessful we will get her back on Botox 2.    She will take Maxalt and/or Percocet if a more severe migraine occurs.  . 3.   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  The acetylcholine receptor antibody test was negative.  She has a little responsiveness to Mestinon.  Diplopia continues to be a problem for her and greatly affects her life.  Because she has had Botox injections, single-fiber EMG could have increased risk of false positives, especially if facial muscles are tested.  We are going to see if we can control the migraines with an anti-CGRP agent which would allow her to stay off the Botox.  Due to her history of diabetes mellitus and cancer I would be reluctant to have her do steroids or immunosuppressant therapy.Marland Kitchen 4.  Continue to use CPAP nightly for OSA.    5.    She will get radiofrequency ablations for her L4-L5 and L5-S1 facet hypertrophy if low back pain worsens again.  She has had benefit for about 9 months when these are done.    6.    Return to see me in about 6 months for the next Botox injection, or sooner if she has new or worsening neurologic symptoms.   44-minute office visit with the majority of the time spent face-to-face for history and physical, discussion/counseling and decision-making.  Additional time with record review and documentation.   Kalli Greenfield A. Epimenio Foot, MD, PhD 10/27/2023, 4:46 PM Certified in Neurology, Clinical Neurophysiology, Sleep Medicine, Pain Medicine and Neuroimaging  [facility] [address], [address] [place], [place] [address] [phone]

## 2023-10-27 NOTE — Progress Notes (Signed)
Botox- 200 units x 1 vial ZOX:[id number] Expiration: 07/2025 NDC: 0981-1914-78  Bacteriostatic 0.9% Sodium Chloride- * mL  GNF:[id number] Expiration: 02/22/2024 NDC:0409-1966-02  Dx:G43.709 S/P Witnessed by:Dr. Epimenio Foot administer medication

## 2023-10-28 ENCOUNTER — Other Ambulatory Visit: Payer: Self-pay | Admitting: Neurology

## 2023-10-28 NOTE — Telephone Encounter (Signed)
Last seen on 10/27/23 Follow up scheduled on 05/11/24

## 2023-11-01 ENCOUNTER — Telehealth: Payer: Self-pay | Admitting: Neurology

## 2023-11-01 ENCOUNTER — Ambulatory Visit: Payer: BC Managed Care – PPO | Admitting: Neurology

## 2023-11-01 DIAGNOSIS — G4733 Obstructive sleep apnea (adult) (pediatric): Secondary | ICD-10-CM | POA: Diagnosis not present

## 2023-11-01 DIAGNOSIS — G43709 Chronic migraine without aura, not intractable, without status migrainosus: Secondary | ICD-10-CM

## 2023-11-01 MED ORDER — AJOVY 225 MG/1.5ML ~~LOC~~ SOAJ: 1.5000 mL | SUBCUTANEOUS | 1 refills | Status: DC

## 2023-11-01 NOTE — Telephone Encounter (Signed)
Rx Ajovy sent to pharmacy. Asked they hold until January

## 2023-11-01 NOTE — Telephone Encounter (Signed)
Patient updated insurance for January so she can get prescription written Jan 1 for Ajovy or whichever one the state would cover. She has samples given to her by Dr. Epimenio Foot to hold her to Jan

## 2023-11-01 NOTE — Addendum Note (Signed)
Addended by: Arther Abbott on: 11/01/2023 08:55 AM   Modules accepted: Orders

## 2023-11-03 DIAGNOSIS — E119 Type 2 diabetes mellitus without complications: Principal | ICD-10-CM

## 2023-11-03 MED ORDER — ROSUVASTATIN 10 MG TABLET
ORAL_TABLET | Freq: Every evening | ORAL | 3 refills | 0.00 days
Start: 2023-11-03 — End: ?

## 2023-11-03 MED ORDER — DEXCOM G7 SENSOR DEVICE
3 refills | 0.00 days
Start: 2023-11-03 — End: ?

## 2023-11-03 MED ORDER — INSULIN ASPART (U-100) 100 UNIT/ML (3 ML) SUBCUTANEOUS PEN
Freq: Three times a day (TID) | SUBCUTANEOUS | 3 refills | 100.00 days
Start: 2023-11-03 — End: ?

## 2023-11-03 NOTE — Progress Notes (Signed)
   [facility]  HOME SLEEP STUDY  STUDY DATE: 11/01/2023 PATIENT NAME: Pamela Murphy DOB: 1971-04-23 MRN: [id number]  ORDERING CLINICIAN: Richard A. Epimenio Foot, MD, PhD INTERPRETING CLINICIAN: Richard A. Sater, MD. PhD   CLINICAL INFORMATION: 52 year old woman with obstructive sleep apnea   IMPRESSION:  Severe obstructive sleep apnea with a pAHI 3% of 55.8/h Mild nocturnal hypoxemia with 8.3% of the night with an SaO2 below 88%   RECOMMENDATION: AutoPap 5 to 15 cm H2O pressure with heated humidifier. Download in 30 to 90 days. Follow-up with Dr. Epimenio Foot.   INTERPRETING PHYSICIAN:   Richard A. Epimenio Foot, MD, PhD, [facility] Certified in Neurology, Clinical Neurophysiology, Sleep Medicine, Pain Medicine and Neuroimaging  [facility] [address], [address] [place], [place] [address] [phone]

## 2023-11-04 ENCOUNTER — Encounter: Payer: Self-pay | Admitting: Neurology

## 2023-11-05 MED ORDER — INSULIN ASPART (U-100) 100 UNIT/ML (3 ML) SUBCUTANEOUS PEN
Freq: Three times a day (TID) | SUBCUTANEOUS | 3 refills | 100.00 days | Status: CP
Start: 2023-11-05 — End: ?
  Filled 2024-01-20: qty 15, 50d supply, fill #0

## 2023-11-05 MED ORDER — DEXCOM G7 SENSOR DEVICE
3 refills | 0.00 days | Status: CP
Start: 2023-11-05 — End: ?

## 2023-11-05 MED ORDER — ROSUVASTATIN 10 MG TABLET
ORAL_TABLET | Freq: Every evening | ORAL | 3 refills | 90.00 days | Status: CP
Start: 2023-11-05 — End: ?

## 2023-11-08 ENCOUNTER — Other Ambulatory Visit: Payer: Self-pay | Admitting: *Deleted

## 2023-11-08 ENCOUNTER — Telehealth: Payer: Self-pay | Admitting: *Deleted

## 2023-11-08 MED FILL — TRESIBA FLEXTOUCH U-100 INSULIN 100 UNIT/ML (3 ML) SUBCUTANEOUS PEN: SUBCUTANEOUS | 83 days supply | Qty: 30 | Fill #3

## 2023-11-08 MED FILL — BD ULTRA-FINE NANO PEN NEEDLE 32 GAUGE X 5/32" (4 MM): 67 days supply | Qty: 200 | Fill #2

## 2023-11-08 MED FILL — DULOXETINE 30 MG CAPSULE,DELAYED RELEASE: ORAL | 30 days supply | Qty: 90 | Fill #5

## 2023-11-08 NOTE — Telephone Encounter (Signed)
See  other mychart messages.  Sent to [facility].

## 2023-11-08 NOTE — Telephone Encounter (Signed)
-----   Message from Asa Lente sent at 11/07/2023  5:19 PM EST ----- Regarding: Home sleep study Please let the patient know she had severe sleep apnea.  Please order a new CPAP (we could use the same DME company that she has used in the past unless she wants to make a change)   Settings: AutoPap 5 to 15 cm H2O pressure with heated humidifier.  download in 30 to 90 days.

## 2023-11-08 NOTE — Telephone Encounter (Signed)
Zott, Hennie Duos, RN; Elsie Lincoln, [place] Got It Thank You       Previous Messages    ----- Message ----- From: Guy Begin, RN Sent: 11/08/2023   9:51 AM EST To: Marlou Porch Zott Subject: new autopap user                              Hi Alonah,  I have sent the autopap order to [facility] [phone], they will call you once they authorized with insurance, if you do not hear from them about one week to call them.  Once they have discussed with you then will order machine , when it comes in to have you come in to office ([facility] ) across from [facility] on [address] to go over use of machine, supplies and fit you for mask.  We at the office will see you back 2-3 months after you have been using, (4 hours or more nightly).  The appointment is for insurance compliance, to make sure patients are using the machine.    Let us know when you get the machine and we can make appointment.    Langley Gauss

## 2023-11-09 MED FILL — ACCU-CHEK SOFTCLIX LANCETS: 75 days supply | Qty: 300 | Fill #1

## 2023-11-11 MED ORDER — PANTOPRAZOLE 40 MG TABLET,DELAYED RELEASE
ORAL_TABLET | Freq: Every day | ORAL | 3 refills | 90.00 days | Status: CP
Start: 2023-11-11 — End: 2024-11-10

## 2023-12-10 ENCOUNTER — Other Ambulatory Visit: Payer: Self-pay | Admitting: Neurology

## 2023-12-10 MED ORDER — PREDNISONE 5 MG TABLET
ORAL_TABLET | 0 refills | 0.00 days
Start: 2023-12-10 — End: ?

## 2023-12-11 MED ORDER — PREDNISONE 5 MG TABLET
ORAL_TABLET | 0 refills | 0.00 days
Start: 2023-12-11 — End: ?

## 2023-12-13 ENCOUNTER — Other Ambulatory Visit: Payer: Self-pay

## 2023-12-13 NOTE — Telephone Encounter (Signed)
Pt last Seen 10/27/2023 Upcoming Appointment 05/11/2024  TiZANidine Last Filled 09/13/2023 90 day supply Next Refill 12/14/2023 Escript 12/13/2023

## 2023-12-30 DIAGNOSIS — M75112 Incomplete rotator cuff tear or rupture of left shoulder, not specified as traumatic: Principal | ICD-10-CM

## 2023-12-30 DIAGNOSIS — C187 Malignant neoplasm of sigmoid colon: Principal | ICD-10-CM

## 2023-12-31 ENCOUNTER — Encounter: Admit: 2023-12-31 | Discharge: 2024-01-01 | Payer: PRIVATE HEALTH INSURANCE

## 2023-12-31 DIAGNOSIS — F411 Generalized anxiety disorder: Principal | ICD-10-CM

## 2023-12-31 MED ORDER — CLONAZEPAM 0.5 MG TABLET
ORAL_TABLET | Freq: Two times a day (BID) | ORAL | 3 refills | 23 days | Status: CP | PRN
Start: 2023-12-31 — End: ?

## 2023-12-31 MED ORDER — DULOXETINE 30 MG CAPSULE,DELAYED RELEASE
ORAL_CAPSULE | Freq: Every day | ORAL | 3 refills | 90.00 days | Status: CP
Start: 2023-12-31 — End: 2024-12-30

## 2024-01-03 ENCOUNTER — Ambulatory Visit: Admit: 2024-01-03 | Discharge: 2024-01-04 | Payer: PRIVATE HEALTH INSURANCE

## 2024-01-03 ENCOUNTER — Ambulatory Visit: Admit: 2024-01-03 | Discharge: 2024-01-03 | Payer: PRIVATE HEALTH INSURANCE

## 2024-01-03 DIAGNOSIS — G7000 Myasthenia gravis without (acute) exacerbation: Principal | ICD-10-CM

## 2024-01-03 DIAGNOSIS — H532 Diplopia: Principal | ICD-10-CM

## 2024-01-06 ENCOUNTER — Other Ambulatory Visit: Payer: Self-pay | Admitting: Neurology

## 2024-01-06 ENCOUNTER — Other Ambulatory Visit: Payer: Self-pay

## 2024-01-17 MED ORDER — ROSUVASTATIN 10 MG TABLET
ORAL_TABLET | Freq: Every evening | ORAL | 3 refills | 90.00 days
Start: 2024-01-17 — End: ?

## 2024-01-17 MED ORDER — PANTOPRAZOLE 40 MG TABLET,DELAYED RELEASE
ORAL_TABLET | Freq: Every day | ORAL | 3 refills | 90.00 days
Start: 2024-01-17 — End: 2025-01-16

## 2024-01-18 MED ORDER — PANTOPRAZOLE 40 MG TABLET,DELAYED RELEASE
ORAL_TABLET | Freq: Every day | ORAL | 3 refills | 90.00 days
Start: 2024-01-18 — End: 2025-01-17

## 2024-01-18 MED ORDER — ROSUVASTATIN 10 MG TABLET
ORAL_TABLET | Freq: Every evening | ORAL | 3 refills | 90.00 days
Start: 2024-01-18 — End: ?

## 2024-01-20 ENCOUNTER — Other Ambulatory Visit: Payer: Self-pay | Admitting: Neurology

## 2024-01-20 MED FILL — TRESIBA FLEXTOUCH U-100 INSULIN 100 UNIT/ML (3 ML) SUBCUTANEOUS PEN: SUBCUTANEOUS | 83 days supply | Qty: 30 | Fill #4

## 2024-01-20 MED FILL — ONDANSETRON 8 MG DISINTEGRATING TABLET: ORAL | 21 days supply | Qty: 18 | Fill #1

## 2024-01-29 ENCOUNTER — Emergency Department
Admit: 2024-01-29 | Discharge: 2024-01-30 | Disposition: A | Discharge status: Home / Self Care | Payer: PRIVATE HEALTH INSURANCE

## 2024-01-29 DIAGNOSIS — R6889 Other general symptoms and signs: Principal | ICD-10-CM

## 2024-01-29 DIAGNOSIS — B349 Viral infection, unspecified: Principal | ICD-10-CM

## 2024-02-01 DIAGNOSIS — C187 Malignant neoplasm of sigmoid colon: Principal | ICD-10-CM

## 2024-02-15 ENCOUNTER — Ambulatory Visit: Admit: 2024-02-15 | Discharge: 2024-02-16 | Payer: PRIVATE HEALTH INSURANCE

## 2024-02-15 DIAGNOSIS — M25819 Other specified joint disorders, unspecified shoulder: Principal | ICD-10-CM

## 2024-02-15 DIAGNOSIS — M75112 Incomplete rotator cuff tear or rupture of left shoulder, not specified as traumatic: Principal | ICD-10-CM

## 2024-02-18 ENCOUNTER — Encounter: Admit: 2024-02-18 | Discharge: 2024-02-19

## 2024-02-18 DIAGNOSIS — F411 Generalized anxiety disorder: Principal | ICD-10-CM

## 2024-02-21 ENCOUNTER — Ambulatory Visit: Admit: 2024-02-21 | Discharge: 2024-02-22

## 2024-02-23 ENCOUNTER — Ambulatory Visit: Admit: 2024-02-23 | Discharge: 2024-02-24

## 2024-02-23 DIAGNOSIS — M75112 Incomplete rotator cuff tear or rupture of left shoulder, not specified as traumatic: Secondary | ICD-10-CM | POA: Insufficient documentation

## 2024-02-23 DIAGNOSIS — G8929 Other chronic pain: Principal | ICD-10-CM

## 2024-02-23 DIAGNOSIS — M25512 Pain in left shoulder: Principal | ICD-10-CM

## 2024-03-02 ENCOUNTER — Ambulatory Visit: Admit: 2024-03-02 | Discharge: 2024-03-03

## 2024-03-02 DIAGNOSIS — H532 Diplopia: Principal | ICD-10-CM

## 2024-03-02 DIAGNOSIS — Z01818 Encounter for other preprocedural examination: Principal | ICD-10-CM

## 2024-03-02 DIAGNOSIS — E1169 Type 2 diabetes mellitus with other specified complication: Principal | ICD-10-CM

## 2024-03-02 DIAGNOSIS — E66813 Class 3 severe obesity with body mass index (BMI) of 40.0 to 44.9 in adult, unspecified obesity type, unspecified whether serious comorbidity present: Principal | ICD-10-CM

## 2024-03-02 DIAGNOSIS — I2510 Atherosclerotic heart disease of native coronary artery without angina pectoris: Principal | ICD-10-CM

## 2024-03-02 DIAGNOSIS — G4733 Obstructive sleep apnea (adult) (pediatric): Principal | ICD-10-CM

## 2024-03-02 DIAGNOSIS — Z794 Long term (current) use of insulin: Principal | ICD-10-CM

## 2024-03-02 DIAGNOSIS — Z6841 Body Mass Index (BMI) 40.0 and over, adult: Principal | ICD-10-CM

## 2024-03-06 MED FILL — LISINOPRIL 20 MG TABLET: ORAL | 30 days supply | Qty: 45 | Fill #2

## 2024-03-06 MED FILL — NOVOLOG FLEXPEN U-100 INSULIN ASPART 100 UNIT/ML (3 ML) SUBCUTANEOUS: SUBCUTANEOUS | 50 days supply | Qty: 15 | Fill #1

## 2024-03-07 ENCOUNTER — Encounter: Admit: 2024-03-07 | Discharge: 2024-03-08 | Payer: PRIVATE HEALTH INSURANCE

## 2024-03-07 DIAGNOSIS — E119 Type 2 diabetes mellitus without complications: Principal | ICD-10-CM

## 2024-03-08 DIAGNOSIS — C187 Malignant neoplasm of sigmoid colon: Principal | ICD-10-CM

## 2024-03-10 DIAGNOSIS — C187 Malignant neoplasm of sigmoid colon: Principal | ICD-10-CM

## 2024-03-13 ENCOUNTER — Other Ambulatory Visit: Payer: Self-pay | Admitting: Neurology

## 2024-03-15 ENCOUNTER — Other Ambulatory Visit: Payer: Self-pay | Admitting: Neurology

## 2024-03-15 ENCOUNTER — Encounter: Payer: Self-pay | Admitting: Neurology

## 2024-03-15 MED ORDER — OXYCODONE-ACETAMINOPHEN 10-325 MG PO TABS: 1.0000 | ORAL_TABLET | Freq: Three times a day (TID) | ORAL | 0 refills | Status: DC | PRN

## 2024-03-16 ENCOUNTER — Telehealth: Payer: Self-pay | Admitting: *Deleted

## 2024-03-16 ENCOUNTER — Other Ambulatory Visit: Payer: Self-pay | Admitting: Neurology

## 2024-03-16 NOTE — Telephone Encounter (Signed)
 Last seen on 10/27/23 Follow up scheduled on 05/11/24

## 2024-03-17 ENCOUNTER — Other Ambulatory Visit (HOSPITAL_COMMUNITY): Payer: Self-pay

## 2024-03-17 ENCOUNTER — Telehealth: Payer: Self-pay

## 2024-03-17 NOTE — Telephone Encounter (Signed)
 Pharmacy Patient Advocate Encounter   Received notification from Physician's Office that prior authorization for oxyCODONE -Acetaminophen  10-325MG  tablets is required/requested.   Insurance verification completed.   The patient is insured through [facility] .   Per test claim: PA required; PA submitted to above mentioned insurance via CoverMyMeds Key/confirmation #/EOC [id number] Status is pending

## 2024-03-17 NOTE — Telephone Encounter (Signed)
 Pharmacy Patient Advocate Encounter  Received notification from [facility] that Prior Authorization for oxyCODONE -Acetaminophen  10-325MG  tablets has been APPROVED from 03-17-2024 to 09-16-2024   PA #/Case ID/Reference #: [id number]

## 2024-03-17 NOTE — Telephone Encounter (Signed)
 Noted.

## 2024-03-23 ENCOUNTER — Inpatient Hospital Stay: Admit: 2024-03-23 | Discharge: 2024-03-24 | Payer: PRIVATE HEALTH INSURANCE

## 2024-03-23 ENCOUNTER — Ambulatory Visit: Admit: 2024-03-23 | Discharge: 2024-03-24 | Payer: PRIVATE HEALTH INSURANCE

## 2024-03-23 DIAGNOSIS — C187 Malignant neoplasm of sigmoid colon: Principal | ICD-10-CM

## 2024-03-23 DIAGNOSIS — R918 Other nonspecific abnormal finding of lung field: Principal | ICD-10-CM

## 2024-03-23 DIAGNOSIS — I201 Angina pectoris with documented spasm: Principal | ICD-10-CM

## 2024-03-23 DIAGNOSIS — N898 Other specified noninflammatory disorders of vagina: Principal | ICD-10-CM

## 2024-03-29 DIAGNOSIS — C187 Malignant neoplasm of sigmoid colon: Principal | ICD-10-CM

## 2024-03-29 MED ORDER — ONDANSETRON HCL 4 MG TABLET
ORAL_TABLET | Freq: Three times a day (TID) | ORAL | 0 refills | 4.00000 days | PRN
Start: 2024-03-29 — End: 2024-04-05

## 2024-03-29 MED ORDER — GABAPENTIN 100 MG CAPSULE
ORAL_CAPSULE | Freq: Three times a day (TID) | ORAL | 0 refills | 5.00000 days
Start: 2024-03-29 — End: 2024-04-03

## 2024-03-29 MED ORDER — ACETAMINOPHEN 500 MG CAPSULE
ORAL_CAPSULE | Freq: Three times a day (TID) | ORAL | 0 refills | 14.00000 days | PRN
Start: 2024-03-29 — End: 2024-04-12

## 2024-03-29 MED ORDER — OXYCODONE 5 MG TABLET
ORAL_TABLET | ORAL | 0 refills | 5.00000 days | PRN
Start: 2024-03-29 — End: 2024-04-03

## 2024-03-29 MED ORDER — DOCUSATE SODIUM 100 MG CAPSULE
ORAL_CAPSULE | Freq: Two times a day (BID) | ORAL | 0 refills | 7.00000 days
Start: 2024-03-29 — End: 2024-04-05

## 2024-03-30 ENCOUNTER — Inpatient Hospital Stay: Admit: 2024-03-30 | Discharge: 2024-03-30

## 2024-03-30 ENCOUNTER — Ambulatory Visit: Admit: 2024-03-30 | Discharge: 2024-03-31 | Payer: PRIVATE HEALTH INSURANCE

## 2024-03-30 ENCOUNTER — Encounter
Admit: 2024-03-30 | Discharge: 2024-03-30 | Payer: PRIVATE HEALTH INSURANCE | Attending: Anesthesiology | Primary: Anesthesiology

## 2024-03-30 HISTORY — PX: SHOULDER SURGERY: SHX246

## 2024-03-30 MED ORDER — GABAPENTIN 100 MG CAPSULE
ORAL_CAPSULE | Freq: Three times a day (TID) | ORAL | 0 refills | 5.00000 days | Status: CP
Start: 2024-03-30 — End: 2024-04-04
  Filled 2024-03-30: qty 15, 5d supply, fill #0

## 2024-03-30 MED ORDER — ONDANSETRON HCL 4 MG TABLET
ORAL_TABLET | Freq: Three times a day (TID) | ORAL | 0 refills | 4.00000 days | Status: CP | PRN
Start: 2024-03-30 — End: 2024-04-06
  Filled 2024-03-30: qty 10, 4d supply, fill #0

## 2024-03-30 MED ORDER — OXYCODONE 5 MG TABLET
ORAL_TABLET | ORAL | 0 refills | 5.00000 days | Status: CP | PRN
Start: 2024-03-30 — End: 2024-04-04
  Filled 2024-03-30: qty 30, 5d supply, fill #0

## 2024-03-30 MED ORDER — ACETAMINOPHEN 500 MG TABLET
ORAL_TABLET | Freq: Three times a day (TID) | ORAL | 0 refills | 7.00000 days | Status: CP | PRN
Start: 2024-03-30 — End: 2024-04-13
  Filled 2024-03-30: qty 42, 7d supply, fill #0

## 2024-03-30 MED ORDER — DOCUSATE SODIUM 100 MG CAPSULE
ORAL_CAPSULE | Freq: Two times a day (BID) | ORAL | 0 refills | 7.00000 days | Status: CP
Start: 2024-03-30 — End: 2024-04-06
  Filled 2024-03-30: qty 14, 7d supply, fill #0

## 2024-04-02 ENCOUNTER — Other Ambulatory Visit: Payer: Self-pay | Admitting: Neurology

## 2024-04-05 ENCOUNTER — Ambulatory Visit: Admit: 2024-04-05 | Discharge: 2024-04-06 | Payer: PRIVATE HEALTH INSURANCE

## 2024-04-05 DIAGNOSIS — M75122 Complete rotator cuff tear or rupture of left shoulder, not specified as traumatic: Principal | ICD-10-CM

## 2024-04-13 ENCOUNTER — Encounter: Admit: 2024-04-13 | Discharge: 2024-04-14 | Payer: PRIVATE HEALTH INSURANCE

## 2024-04-13 DIAGNOSIS — F411 Generalized anxiety disorder: Principal | ICD-10-CM

## 2024-04-30 ENCOUNTER — Other Ambulatory Visit: Payer: Self-pay | Admitting: Neurology

## 2024-05-01 ENCOUNTER — Ambulatory Visit
Admit: 2024-05-01 | Discharge: 2024-05-02 | Payer: PRIVATE HEALTH INSURANCE | Attending: Student in an Organized Health Care Education/Training Program | Primary: Student in an Organized Health Care Education/Training Program

## 2024-05-01 ENCOUNTER — Ambulatory Visit: Admit: 2024-05-01 | Discharge: 2024-05-02 | Payer: PRIVATE HEALTH INSURANCE

## 2024-05-01 DIAGNOSIS — M75122 Complete rotator cuff tear or rupture of left shoulder, not specified as traumatic: Secondary | ICD-10-CM | POA: Insufficient documentation

## 2024-05-02 NOTE — Telephone Encounter (Signed)
 Last seen on 10/27/23 Follow up scheduled on 05/11/24

## 2024-05-04 DIAGNOSIS — E119 Type 2 diabetes mellitus without complications: Principal | ICD-10-CM

## 2024-05-04 MED ORDER — INSULIN DEGLUDEC (U-100) 100 UNIT/ML (3 ML) SUBCUTANEOUS PEN
Freq: Every day | SUBCUTANEOUS | 4 refills | 83.00000 days | Status: CP
Start: 2024-05-04 — End: ?
  Filled 2024-05-10: qty 30, 83d supply, fill #0

## 2024-05-04 MED ORDER — AJOVY 225 MG/1.5 ML SUBCUTANEOUS AUTO-INJECTOR
1 refills | 0.00000 days
Start: 2024-05-04 — End: ?

## 2024-05-04 MED ORDER — RIZATRIPTAN 10 MG DISINTEGRATING TABLET
ORAL_TABLET | Freq: Every day | 0 refills | 90.00000 days | Status: CP | PRN
Start: 2024-05-04 — End: 2024-08-12

## 2024-05-04 MED ORDER — OZEMPIC 2 MG/DOSE (8 MG/3 ML) SUBCUTANEOUS PEN INJECTOR
SUBCUTANEOUS | 11 refills | 28.00000 days | Status: CP
Start: 2024-05-04 — End: 2025-05-04
  Filled 2024-07-11: qty 3, 28d supply, fill #0

## 2024-05-08 DIAGNOSIS — E119 Type 2 diabetes mellitus without complications: Principal | ICD-10-CM

## 2024-05-08 MED ORDER — AJOVY 225 MG/1.5 ML SUBCUTANEOUS AUTO-INJECTOR
1 refills | 0.00000 days
Start: 2024-05-08 — End: ?

## 2024-05-10 MED FILL — LISINOPRIL 20 MG TABLET: ORAL | 30 days supply | Qty: 45 | Fill #3

## 2024-05-10 MED FILL — NOVOLOG FLEXPEN U-100 INSULIN ASPART 100 UNIT/ML (3 ML) SUBCUTANEOUS: SUBCUTANEOUS | 50 days supply | Qty: 15 | Fill #2

## 2024-05-10 MED FILL — ONDANSETRON 8 MG DISINTEGRATING TABLET: ORAL | 21 days supply | Qty: 18 | Fill #2

## 2024-05-10 MED FILL — DEXCOM G7 SENSOR DEVICE: ORAL | 90 days supply | Qty: 9 | Fill #0

## 2024-05-11 ENCOUNTER — Ambulatory Visit: Payer: BC Managed Care – PPO | Admitting: Neurology

## 2024-05-11 ENCOUNTER — Encounter: Payer: Self-pay | Admitting: Neurology

## 2024-05-11 VITALS — BP 178/95 | HR 87 | Ht 66.0 in | Wt 269.0 lb

## 2024-05-11 DIAGNOSIS — G7000 Myasthenia gravis without (acute) exacerbation: Secondary | ICD-10-CM

## 2024-05-11 DIAGNOSIS — H532 Diplopia: Secondary | ICD-10-CM

## 2024-05-11 DIAGNOSIS — H6990 Unspecified Eustachian tube disorder, unspecified ear: Secondary | ICD-10-CM | POA: Insufficient documentation

## 2024-05-11 DIAGNOSIS — Z79899 Other long term (current) drug therapy: Secondary | ICD-10-CM | POA: Diagnosis not present

## 2024-05-11 DIAGNOSIS — M47816 Spondylosis without myelopathy or radiculopathy, lumbar region: Secondary | ICD-10-CM

## 2024-05-11 DIAGNOSIS — G4733 Obstructive sleep apnea (adult) (pediatric): Secondary | ICD-10-CM | POA: Diagnosis not present

## 2024-05-11 DIAGNOSIS — G43709 Chronic migraine without aura, not intractable, without status migrainosus: Secondary | ICD-10-CM

## 2024-05-11 DIAGNOSIS — C189 Malignant neoplasm of colon, unspecified: Secondary | ICD-10-CM

## 2024-05-11 MED ORDER — OXYCODONE-ACETAMINOPHEN 10-325 MG PO TABS: 1.0000 | ORAL_TABLET | Freq: Three times a day (TID) | ORAL | 0 refills | Status: DC | PRN

## 2024-05-11 MED ORDER — GABAPENTIN 300 MG PO CAPS: ORAL_CAPSULE | ORAL | 11 refills | Status: AC

## 2024-05-11 NOTE — Progress Notes (Signed)
 [facility]  PATIENT: Pamela Murphy DOB: [birth year]  REASON FOR VISIT: Chronic Migraine headaches   HISTORICAL  CHIEF COMPLAINT:  Chief Complaint  Patient presents with   RM10/Migraines/CPAP    Pt is here Alone. Pt states that she is having pain and numbness in her right arm and hand that has been consistent. Pt states that her migraines have been doing good since her last appointment. Pt states she has been doing well with her CPAP.     HISTORY OF PRESENT ILLNESS:  Pamela Murphy is a 53 y.o. woman with a chronic migraine disorder, obstructive sleep apnea and abnormal MRI of the brain.   Update 05/11/2024 She is noting more painful dysesthesias in her right hand.   Numbness is in all fingers.   This sometimes wakes her up.     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   She also is tender at the right inner elbow.    Strength is fine in the hand.  Chronic migraine headaches are doing very well on Ajovy .     Diplopia started in 2023.  At that time, she was on chemotherapy for her colon cancer.  Diplopia iis binocular and often worse on lateral gaze but can be present at primary gaze (esp with driving so often closes one eye).   It fluctuates quite a bit and is usually worse as the day goes on..  She has not had ptosis.  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   She has prism  glasses that help for driving but not with reading/computer work  She has seen Neuro-ophthalmology (Dr. Ola Berger at [facility]) who had felt that myasthenia is a possibility.     She noted variable esotropia from visit to visit (was 10-12 first visit with her and 4-6 second visit).    She started pyridostigmine  for possible myasthenia gravis.   She was on 60 mg po tid but stopped - does the same off and on.    We had tested acetylcholine receptor antibodies and they were negative.  Her neuro-ophthalmologist tested her for LEMS.   We could consider Then,  sometime in May 2025 we could consider her doing single-fiber EMG (she would prefer to do at [facility] if we do this)  She has OSA and is on CPAP and tolerates it well.    Download last year showed 100% compliance with excellent efficacy (AHI of 0.8).  She sleeps better when she uses CPAP than when she does not.  She feels refreshed in the morning.  Low back pain has improved with radiofrequency ablations of the medial branches nerves.  MRI of the lumbar spine has shown left greater than right facet hypertrophy at L4-L5 and L5-S1.  She has colon cancer and had a partial resection resection.   She did oxiplatin and capectotabine.   No liver met's.   She has 2 small nodules in lung nit felt to be cancer.  . She has had repeat cancer markers and they are fine.   While receiving chemotherapy she did have some numbness in her feet that has since improved.  However, she has persistent left lateral plantar region.  There is no weakness or pain noted.     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    She did just start menopause.   She takes tizanidine .  Initially it helped her sleep but now even 2 pills  has not helped much.      Trazodone had not helped 10-15 years ago.   She takes gabapentin  300 mgat night as well.    She has RLS, worse on the left.     Migraine history:  She has a long history of chronic migraines that were occurring almost every day (25-28 days/month) for 4 or more hours a day. Multiple medications were tried for prophylactic and abortive therapies with suboptimal response.    In the past, she has failed multiple medications including:  Antiepileptics (Topamax, Keppra, zonisamide), anti-depressants (Cymbalta, Prozac), multiple NSAID's, betas blockers (Inderal, atenolol ), muscle relaxants (soma, tizanidine , cyclobenzaprine) in various combinations.   Once the headache occurs, she has tried triptan's with incomplete benefit much of the time. She has also tried opiates.   IM Toradol  has  been most effective when one occurs.  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   I undiagnosed her around 2010 and she got a second opinion from Dr. Alberta Almond who also concurred.  MRI shows small round foci predominantly in the subcortical and deep white matter in a nonspecific manner.  Her last MRI of the brain was in 2016 and was stable compared to the previous ones.  She had had paresthesias and fatigue in the past that contributed to her diagnosis.   We have discussed that she has been stable times many years off therapy that MS is significantly less likely.   REVIEW OF SYSTEMS:  Constitutional: No fevers, chills, sweats, or change in appetite Eyes: No visual changes.  She has diplopia.   Ear, nose and throat: No hearing loss, ear pain, nasal congestion, sore throat Cardiovascular: No chest pain, palpitations Respiratory:  see above.  She has OSA. GastrointestinaI: No nausea, vomiting, diarrhea, abdominal pain, fecal incontinence Genitourinary:  No dysuria, urinary retention or frequency.  No nocturia.   Dysfunctional uterine bleeding Musculoskeletal:  notes low back pain that has been worse the last couple months and neck pain that is helped by Botox  Integumentary: No rash, pruritus, skin lesions Neurological: as above Psychiatric: No depression at this time.  No anxiety Endocrine: No palpitations, diaphoresis, change in appetite, change in weigh or increased thirst Hematologic/Lymphatic:   She has anemia due to dysfunctional uterine bleeding. Allergic/Immunologic: No itchy/runny eyes, nasal congestion, recent allergic reactions, rashes  ALLERGIES: Allergies  Allergen Reactions   Metoclopramide Anxiety    Other Reaction(s): Not available   Cefaclor Swelling and Rash     neck swelling   Prochlorperazine Maleate Rash and Other (See Comments)     jittery;tachycardia   Bupropion Anxiety   Sulfamethoxazole-Trimethoprim Nausea  Only   Tessalon Perles [Benzonatate] Rash    HOME MEDICATIONS: Outpatient Medications Prior to Visit  Medication Sig Dispense Refill   atenolol  (TENORMIN ) 25 MG tablet TAKE 1 TABLET (25 MG TOTAL) BY MOUTH DAILY. 90 tablet 3   clonazePAM (KLONOPIN) 0.5 MG tablet Take 0.5 mg by mouth 2 (two) times daily as needed.     DULoxetine (CYMBALTA) 60 MG capsule Take 90 mg by mouth daily. 90 mg tablet daily     Fremanezumab -vfrm (AJOVY ) 225 MG/1.5ML SOAJ Inject 1.5 mLs into the skin every 30 (thirty) days. 1.5 mL 1   gabapentin  (NEURONTIN ) 300 MG capsule TAKE 1 TO 2 CAPSULES BY MOUTH AT BEDTIME 60 capsule 1   Multiple Vitamins-Minerals (MULTIVITAMIN WITH MINERALS) tablet Take by mouth.     ondansetron  (ZOFRAN -ODT) 8 MG disintegrating tablet TAKE  1 TABLET BY MOUTH EVERY 8 HOURS AS NEEDED FOR NAUSEA AND VOMITING 18 tablet 4   pantoprazole (PROTONIX) 40 MG tablet Take 40 mg by mouth 2 (two) times daily.      PNEUMOCOCCAL VAC POLYVALENT IJ Inject as directed. Received in January 2021     rizatriptan  (MAXALT -MLT) 10 MG disintegrating tablet TAKE 1 TABLET BY MOUTH AS NEEDED FOR MIGRAINE. MAY REPEAT IN 2 HOURS IF NEEDED 12 tablet 3   tiZANidine  (ZANAFLEX ) 4 MG tablet TAKE 1 TABLET BY MOUTH THREE TIMES A DAY 270 tablet 2   oxyCODONE -acetaminophen  (PERCOCET) 10-325 MG tablet Take 1 tablet by mouth every 8 (eight) hours as needed for pain. 45 tablet 0   botulinum toxin Type A  (BOTOX ) 200 units injection INJECT INTO THE MUSCLES BY PHYSICIAN EVERY 3 MONTHS FOR MIGRAINE HEADACHES. DISCARD UNUSED PORTION. (Patient not taking: Reported on 05/11/2024) 1 each 3   pyridostigmine  (MESTINON ) 60 MG tablet TAKE 1 TABLET BY MOUTH THREE TIMES A DAY 270 tablet 0   Facility-Administered Medications Prior to Visit  Medication Dose Route Frequency Provider Last Rate Last Admin   botulinum toxin Type A  (BOTOX ) injection 155 Units  155 Units Intramuscular Once Vasily Fedewa, Sherida Dimmer, MD        PAST MEDICAL HISTORY: Past Medical  History:  Diagnosis Date   Headache    Hypertension    Type 2 diabetes mellitus (HCC) 06/08/2018   Vision abnormalities     PAST SURGICAL HISTORY: Past Surgical History:  Procedure Laterality Date   SHOULDER SURGERY Left 03/30/2024   SINUS IRRIGATION      FAMILY HISTORY: Family History  Problem Relation Age of Onset   Breast cancer Mother    Melanoma Mother    Atrial fibrillation Mother    Supraventricular tachycardia Mother    Heart attack Father    Hypertension Father    Hyperlipidemia Father     SOCIAL HISTORY:  Social History   Socioeconomic History   Marital status: Married    Spouse name: Not on file   Number of children: Not on file   Years of education: Not on file   Highest education level: Not on file  Occupational History    Comment: full time  Tobacco Use   Smoking status: Never   Smokeless tobacco: Never  Substance and Sexual Activity   Alcohol use: No    Alcohol/week: 0.0 standard drinks of alcohol    Comment: Rare   Drug use: No   Sexual activity: Not on file  Other Topics Concern   Not on file  Social History Narrative   Lives with spouse   Right Handed   Drinks 1-2 cups caffeine daily   Social Drivers of Health   Financial Resource Strain: Low Risk  (12/22/2023)   Received from [facility]   Overall Financial Resource Strain (CARDIA)    How hard is it for you to pay for the very basics like food, housing, medical care, and heating?: Not hard at all  Food Insecurity: No Food Insecurity (12/22/2023)   Received from [facility]   Hunger Vital Sign    Within the past 12 months, you worried that your food would run out before you got the money to buy more.: Never true    Within the past 12 months, the food you bought just didn't last and you didn't have money to get more.: Never true  Transportation Needs: No Transportation Needs (12/22/2023)   Received from [facility]  PRAPARE - Administrator, Civil Service  (Medical): No    Lack of Transportation (Non-Medical): No  Physical Activity: Not on file  Stress: Not on file  Social Connections: Not on file  Intimate Partner Violence: Not At Risk (09/07/2022)   Received from [facility]   Humiliation, Afraid, Rape, and Kick questionnaire    Within the last year, have you been afraid of your partner or ex-partner?: No    Within the last year, have you been humiliated or emotionally abused in other ways by your partner or ex-partner?: No    Within the last year, have you been kicked, hit, slapped, or otherwise physically hurt by your partner or ex-partner?: No    Within the last year, have you been raped or forced to have any kind of sexual activity by your partner or ex-partner?: No     PHYSICAL EXAM  Vitals:   05/11/24 1529  BP: (!) 178/95  Pulse: 87  SpO2: 98%  Weight: 269 lb (122 kg)  Height: 5' 6 (1.676 m)        Body mass index is 43.42 kg/m.   General: The patient is well-developed and well-nourished and in no acute distress  Musculoskeletal:   Her neck has a good ROM. She has no neck pain.   Mildly reduced left shoulder range of motion (active).  Neurologic Exam  Mental status: The patient is alert and oriented x 3 at the time of the examination.  Speech is normal.  Cranial nerves: Today, she has diplopia that increases to lateral gaze, R>L.  Gaze is disconjugate when she looks to the right more than when she looks to the left.  There is milder right esotropia on primary gaze.    No current ptosis.   Vision is bette with prism  glasses.   Facial strength and sensation was normal.  Trapezius strength was normal.  No obvious hearing deficits are noted.  Motor:   Muscle tone and bulk is normal. She has normal strength.    Sensory: She has Tinel's sign at the right elbow.  There was intact sensation to touch in the arms and legs.  Gait and station: Station and gait are normal.     Deep tendon reflexes are normal and  symmetric 2 in the arms and 1 in the legs.       ASSESSMENT AND PLAN    1. Chronic migraine w/o aura w/o status migrainosus, not intractable   2. Obstructive sleep apnea   3. Diplopia   4. High risk medication use   5. Ocular myasthenia gravis (HCC)   6. Facet syndrome, lumbar   7. Malignant neoplasm of colon, unspecified part of colon (HCC)      1.   Continue Ajovy  for migraine - doing better than Botox .     2.   She will take Maxalt  and/or Percocet if a more severe migraine occurs.  . 3.   Vision is better with prisms.   4.   Continue to use CPAP nightly for OSA.   Efficacy is excellent with AHI = 1.2 5.   She will get radiofrequency ablations for her L4-L5 and L5-S1 facet hypertrophy when the lower back pain returns.  This has been very helpful for her.  Again.  She has had benefit for about 9 to 12 months when these are done.   If pain is worse, she takes an oycodone.  6.    She has had numbness/pain, possibly a right  ulnar neuropathy.  NCV in the past had shown right carpal tunnel syndrome.  Steroid dose pack 60 mg prednisone to 10 mg over 6 days.  If not better we will check NCV/EMG Return to see me in about 6 months for the next Botox  injection, or sooner if she has new or worsening neurologic symptoms.   Zerick Prevette A. Godwin Lat, MD, PhD 05/11/2024, 4:05 PM Certified in Neurology, Clinical Neurophysiology, Sleep Medicine, Pain Medicine and Neuroimaging  [facility] [address] [place], [place] [address] [phone]

## 2024-05-31 MED ORDER — PEN NEEDLE, DIABETIC 32 GAUGE X 5/32" (4 MM)
ORAL | 6 refills | 0.00000 days | Status: CP
Start: 2024-05-31 — End: ?
  Filled 2024-06-02: qty 200, 67d supply, fill #0

## 2024-06-05 ENCOUNTER — Encounter: Admit: 2024-06-05 | Discharge: 2024-06-06 | Payer: PRIVATE HEALTH INSURANCE

## 2024-06-05 DIAGNOSIS — F5105 Insomnia due to other mental disorder: Principal | ICD-10-CM

## 2024-06-05 DIAGNOSIS — F99 Mental disorder, not otherwise specified: Principal | ICD-10-CM

## 2024-06-05 DIAGNOSIS — F411 Generalized anxiety disorder: Principal | ICD-10-CM

## 2024-06-20 ENCOUNTER — Inpatient Hospital Stay: Admit: 2024-06-20 | Discharge: 2024-06-21 | Payer: PRIVATE HEALTH INSURANCE

## 2024-06-21 ENCOUNTER — Ambulatory Visit: Admit: 2024-06-21 | Discharge: 2024-06-22 | Payer: PRIVATE HEALTH INSURANCE

## 2024-06-21 DIAGNOSIS — S43004D Unspecified dislocation of right shoulder joint, subsequent encounter: Principal | ICD-10-CM

## 2024-06-21 DIAGNOSIS — M75122 Complete rotator cuff tear or rupture of left shoulder, not specified as traumatic: Principal | ICD-10-CM

## 2024-06-24 ENCOUNTER — Emergency Department
Admit: 2024-06-24 | Discharge: 2024-06-25 | Disposition: A | Discharge status: Home / Self Care | Payer: Medicaid (Managed Care) | Attending: Student in an Organized Health Care Education/Training Program

## 2024-06-24 DIAGNOSIS — G43801 Other migraine, not intractable, with status migrainosus: Principal | ICD-10-CM

## 2024-06-24 DIAGNOSIS — M25511 Pain in right shoulder: Principal | ICD-10-CM

## 2024-06-24 MED ORDER — LISINOPRIL 20 MG TABLET
ORAL_TABLET | Freq: Every day | ORAL | 0 refills | 30.00000 days | Status: CP
Start: 2024-06-24 — End: 2024-07-24
  Filled 2024-08-07: qty 45, 30d supply, fill #4

## 2024-06-24 MED ORDER — LIDOCAINE 5 % TOPICAL PATCH
MEDICATED_PATCH | TRANSDERMAL | 0 refills | 30.00000 days | Status: CP
Start: 2024-06-24 — End: 2024-07-24

## 2024-06-26 DIAGNOSIS — C187 Malignant neoplasm of sigmoid colon: Principal | ICD-10-CM

## 2024-06-27 ENCOUNTER — Other Ambulatory Visit: Payer: Self-pay | Admitting: Neurology

## 2024-06-27 DIAGNOSIS — G43709 Chronic migraine without aura, not intractable, without status migrainosus: Secondary | ICD-10-CM

## 2024-06-27 DIAGNOSIS — F411 Generalized anxiety disorder: Principal | ICD-10-CM

## 2024-06-27 MED ORDER — DULOXETINE 30 MG CAPSULE,DELAYED RELEASE
ORAL_CAPSULE | Freq: Every day | ORAL | 3 refills | 90.00000 days | Status: CP
Start: 2024-06-27 — End: 2025-06-27
  Filled 2024-07-11: qty 270, 90d supply, fill #0

## 2024-06-27 MED ORDER — ATENOLOL 25 MG TABLET
ORAL_TABLET | Freq: Every evening | ORAL | 3 refills | 90.00000 days | Status: CP
Start: 2024-06-27 — End: 2025-06-27
  Filled 2024-06-28: qty 90, 90d supply, fill #0

## 2024-06-27 NOTE — Telephone Encounter (Signed)
 Last seen on 04/24/23 Follow up scheduled on 12/11/24

## 2024-06-28 MED FILL — ONDANSETRON 8 MG DISINTEGRATING TABLET: ORAL | 21 days supply | Qty: 18 | Fill #3

## 2024-06-28 MED FILL — NOVOLOG FLEXPEN U-100 INSULIN ASPART 100 UNIT/ML (3 ML) SUBCUTANEOUS: SUBCUTANEOUS | 50 days supply | Qty: 15 | Fill #3

## 2024-06-29 DIAGNOSIS — F411 Generalized anxiety disorder: Principal | ICD-10-CM

## 2024-06-29 MED ORDER — CLONAZEPAM 0.5 MG TABLET
ORAL_TABLET | Freq: Two times a day (BID) | ORAL | 2 refills | 23.00000 days | Status: CP | PRN
Start: 2024-06-29 — End: ?

## 2024-07-03 ENCOUNTER — Other Ambulatory Visit: Payer: Self-pay | Admitting: Neurology

## 2024-07-03 ENCOUNTER — Encounter: Payer: Self-pay | Admitting: Neurology

## 2024-07-03 MED ORDER — INDOMETHACIN 25 MG PO CAPS: 25.0000 mg | ORAL_CAPSULE | Freq: Three times a day (TID) | ORAL | 1 refills | Status: DC

## 2024-07-09 DIAGNOSIS — F411 Generalized anxiety disorder: Principal | ICD-10-CM

## 2024-07-09 MED ORDER — CLONAZEPAM 0.5 MG TABLET
ORAL_TABLET | Freq: Two times a day (BID) | ORAL | 2 refills | 23.00000 days | PRN
Start: 2024-07-09 — End: ?

## 2024-07-10 MED ORDER — CLONAZEPAM 0.5 MG TABLET
ORAL_TABLET | Freq: Two times a day (BID) | ORAL | 2 refills | 23.00000 days | PRN
Start: 2024-07-10 — End: ?

## 2024-07-17 DIAGNOSIS — C187 Malignant neoplasm of sigmoid colon: Principal | ICD-10-CM

## 2024-07-18 MED ORDER — PEG 3350-ELECTROLYTES 236 GRAM-22.74 GRAM-6.74 GRAM-5.86 GRAM SOLUTION
0 refills | 0.00000 days | Status: CP
Start: 2024-07-18 — End: ?

## 2024-07-21 ENCOUNTER — Encounter: Admit: 2024-07-21 | Discharge: 2024-07-22 | Payer: PRIVATE HEALTH INSURANCE

## 2024-07-21 DIAGNOSIS — F411 Generalized anxiety disorder: Principal | ICD-10-CM

## 2024-07-21 DIAGNOSIS — G4700 Insomnia, unspecified: Principal | ICD-10-CM

## 2024-07-26 ENCOUNTER — Ambulatory Visit: Admit: 2024-07-26 | Discharge: 2024-07-27 | Payer: PRIVATE HEALTH INSURANCE

## 2024-07-26 ENCOUNTER — Inpatient Hospital Stay: Admit: 2024-07-26 | Discharge: 2024-07-27 | Payer: PRIVATE HEALTH INSURANCE

## 2024-07-26 DIAGNOSIS — S46011D Strain of muscle(s) and tendon(s) of the rotator cuff of right shoulder, subsequent encounter: Principal | ICD-10-CM

## 2024-07-26 DIAGNOSIS — S43004D Unspecified dislocation of right shoulder joint, subsequent encounter: Principal | ICD-10-CM

## 2024-07-26 DIAGNOSIS — M75122 Complete rotator cuff tear or rupture of left shoulder, not specified as traumatic: Principal | ICD-10-CM

## 2024-08-01 ENCOUNTER — Encounter
Admit: 2024-08-01 | Discharge: 2024-08-01 | Payer: PRIVATE HEALTH INSURANCE | Attending: Anesthesiology | Primary: Anesthesiology

## 2024-08-01 ENCOUNTER — Inpatient Hospital Stay: Admit: 2024-08-01 | Discharge: 2024-08-01 | Payer: PRIVATE HEALTH INSURANCE

## 2024-08-05 ENCOUNTER — Inpatient Hospital Stay: Admit: 2024-08-05 | Discharge: 2024-08-05 | Payer: PRIVATE HEALTH INSURANCE

## 2024-08-07 DIAGNOSIS — H518 Other specified disorders of binocular movement: Principal | ICD-10-CM

## 2024-08-07 DIAGNOSIS — H532 Diplopia: Principal | ICD-10-CM

## 2024-08-07 MED FILL — ONDANSETRON 8 MG DISINTEGRATING TABLET: ORAL | 21 days supply | Qty: 18 | Fill #4

## 2024-08-07 MED FILL — NANO 2ND GEN PEN NEEDLE 32 GAUGE X 5/32": ORAL | 67 days supply | Qty: 200 | Fill #1

## 2024-08-07 MED FILL — TRESIBA FLEXTOUCH U-100 INSULIN 100 UNIT/ML (3 ML) SUBCUTANEOUS PEN: SUBCUTANEOUS | 83 days supply | Qty: 30 | Fill #1

## 2024-08-07 MED FILL — NOVOLOG FLEXPEN U-100 INSULIN ASPART 100 UNIT/ML (3 ML) SUBCUTANEOUS: SUBCUTANEOUS | 50 days supply | Qty: 15 | Fill #4

## 2024-08-08 ENCOUNTER — Ambulatory Visit: Admit: 2024-08-08 | Discharge: 2024-08-09 | Payer: PRIVATE HEALTH INSURANCE

## 2024-08-08 DIAGNOSIS — S43004D Unspecified dislocation of right shoulder joint, subsequent encounter: Principal | ICD-10-CM

## 2024-08-08 DIAGNOSIS — S46011D Strain of muscle(s) and tendon(s) of the rotator cuff of right shoulder, subsequent encounter: Principal | ICD-10-CM

## 2024-08-09 DIAGNOSIS — S46011D Strain of muscle(s) and tendon(s) of the rotator cuff of right shoulder, subsequent encounter: Principal | ICD-10-CM

## 2024-08-09 MED ORDER — TRAMADOL 50 MG TABLET
ORAL_TABLET | Freq: Four times a day (QID) | ORAL | 0 refills | 5.00000 days | Status: CP | PRN
Start: 2024-08-09 — End: 2025-08-09

## 2024-08-18 ENCOUNTER — Emergency Department
Admit: 2024-08-18 | Discharge: 2024-08-19 | Disposition: A | Discharge status: Home / Self Care | Payer: PRIVATE HEALTH INSURANCE

## 2024-08-18 DIAGNOSIS — M25511 Pain in right shoulder: Principal | ICD-10-CM

## 2024-08-18 MED ORDER — HYDROMORPHONE 2 MG TABLET
ORAL_TABLET | Freq: Four times a day (QID) | ORAL | 0 refills | 3.00000 days | Status: CP | PRN
Start: 2024-08-18 — End: ?

## 2024-08-24 ENCOUNTER — Encounter: Payer: Self-pay | Admitting: Neurology

## 2024-08-25 ENCOUNTER — Other Ambulatory Visit (HOSPITAL_COMMUNITY): Payer: Self-pay

## 2024-08-25 ENCOUNTER — Telehealth: Payer: Self-pay

## 2024-08-25 NOTE — Telephone Encounter (Signed)
 Pharmacy Patient Advocate Encounter   Received notification from CoverMyMeds that prior authorization for Oxycodone -acetaminophen  10-325mg  tablet is required/requested.   Insurance verification completed.   The patient is insured through [facility].   Per test claim: The current 30 day co-pay is, $0.  No PA needed at this time. This test claim was processed through [facility]- copay amounts may vary at other pharmacies due to pharmacy/plan contracts, or as the patient moves through the different stages of their insurance plan.      Current PA on file good thru 09/16/2024

## 2024-08-30 ENCOUNTER — Other Ambulatory Visit: Payer: Self-pay | Admitting: Neurology

## 2024-08-30 MED ORDER — OXYCODONE-ACETAMINOPHEN 10-325 MG PO TABS: 1.0000 | ORAL_TABLET | Freq: Three times a day (TID) | ORAL | 0 refills | Status: DC | PRN

## 2024-08-30 NOTE — Telephone Encounter (Signed)
 Patient requesting Oxy for shoulder pain. Please advise, thank you!

## 2024-09-03 ENCOUNTER — Other Ambulatory Visit: Payer: Self-pay | Admitting: Neurology

## 2024-09-04 ENCOUNTER — Other Ambulatory Visit: Payer: Self-pay | Admitting: Neurology

## 2024-09-04 NOTE — Telephone Encounter (Signed)
 Last seen on 05/11/24 Follow up scheduled on 12/11/24

## 2024-09-05 NOTE — Telephone Encounter (Signed)
 Last seen on 05/11/24 Follow up scheduled on 12/12/23

## 2024-09-12 ENCOUNTER — Ambulatory Visit: Admit: 2024-09-12 | Discharge: 2024-09-13 | Payer: PRIVATE HEALTH INSURANCE

## 2024-09-12 ENCOUNTER — Inpatient Hospital Stay: Admit: 2024-09-12 | Discharge: 2024-09-12 | Payer: PRIVATE HEALTH INSURANCE

## 2024-09-12 ENCOUNTER — Encounter: Admit: 2024-09-12 | Discharge: 2024-09-12 | Payer: PRIVATE HEALTH INSURANCE

## 2024-09-12 MED ORDER — DOCUSATE SODIUM 100 MG CAPSULE
ORAL_CAPSULE | Freq: Two times a day (BID) | ORAL | 0 refills | 14.00000 days | Status: CP
Start: 2024-09-12 — End: 2024-09-26
  Filled 2024-09-12: qty 28, 14d supply, fill #0

## 2024-09-12 MED ORDER — ACETAMINOPHEN 500 MG TABLET
ORAL_TABLET | Freq: Three times a day (TID) | ORAL | 0 refills | 14.00000 days | Status: CP
Start: 2024-09-12 — End: 2024-09-26

## 2024-09-12 MED ORDER — OXYCODONE 5 MG TABLET
ORAL_TABLET | ORAL | 0 refills | 5.00000 days | Status: CP | PRN
Start: 2024-09-12 — End: 2024-09-17
  Filled 2024-09-12: qty 25, 5d supply, fill #0
  Filled 2024-09-12: qty 84, 14d supply, fill #0

## 2024-09-12 MED ORDER — ONDANSETRON 4 MG DISINTEGRATING TABLET
ORAL_TABLET | Freq: Three times a day (TID) | 0 refills | 4.00000 days | Status: CP | PRN
Start: 2024-09-12 — End: 2024-09-26
  Filled 2024-09-12: qty 10, 4d supply, fill #0

## 2024-09-18 ENCOUNTER — Ambulatory Visit: Admit: 2024-09-18 | Discharge: 2024-09-19 | Payer: PRIVATE HEALTH INSURANCE

## 2024-09-18 DIAGNOSIS — Z9889 Other specified postprocedural states: Principal | ICD-10-CM

## 2024-09-18 DIAGNOSIS — S46011D Strain of muscle(s) and tendon(s) of the rotator cuff of right shoulder, subsequent encounter: Principal | ICD-10-CM

## 2024-09-18 MED ORDER — HYDROMORPHONE 2 MG TABLET
ORAL_TABLET | ORAL | 0 refills | 4.00000 days | Status: CP | PRN
Start: 2024-09-18 — End: 2024-09-18

## 2024-09-18 MED ORDER — OXYCODONE 5 MG TABLET
ORAL_TABLET | Freq: Four times a day (QID) | ORAL | 0 refills | 5.00000 days | Status: CP | PRN
Start: 2024-09-18 — End: 2024-09-23
  Filled 2024-09-18: qty 20, 5d supply, fill #0

## 2024-09-24 DIAGNOSIS — C187 Malignant neoplasm of sigmoid colon: Principal | ICD-10-CM

## 2024-09-28 ENCOUNTER — Encounter
Admit: 2024-09-28 | Discharge: 2024-09-29 | Payer: PRIVATE HEALTH INSURANCE | Attending: Student in an Organized Health Care Education/Training Program | Primary: Student in an Organized Health Care Education/Training Program

## 2024-09-28 ENCOUNTER — Ambulatory Visit: Admit: 2024-09-28 | Discharge: 2024-09-29 | Payer: PRIVATE HEALTH INSURANCE

## 2024-09-28 DIAGNOSIS — T451X5A Adverse effect of antineoplastic and immunosuppressive drugs, initial encounter: Principal | ICD-10-CM

## 2024-09-28 DIAGNOSIS — G620 Drug-induced polyneuropathy: Principal | ICD-10-CM

## 2024-09-28 DIAGNOSIS — C187 Malignant neoplasm of sigmoid colon: Principal | ICD-10-CM

## 2024-09-29 DIAGNOSIS — C187 Malignant neoplasm of sigmoid colon: Principal | ICD-10-CM

## 2024-10-02 DIAGNOSIS — C187 Malignant neoplasm of sigmoid colon: Principal | ICD-10-CM

## 2024-10-11 DIAGNOSIS — C187 Malignant neoplasm of sigmoid colon: Principal | ICD-10-CM

## 2024-10-11 MED ORDER — ONDANSETRON 8 MG DISINTEGRATING TABLET
ORAL_TABLET | Freq: Three times a day (TID) | ORAL | 2 refills | 10.00000 days | Status: CP | PRN
Start: 2024-10-11 — End: ?
  Filled 2024-10-13: qty 18, 21d supply, fill #0

## 2024-10-11 MED ORDER — PANTOPRAZOLE 40 MG TABLET,DELAYED RELEASE
ORAL_TABLET | Freq: Every day | ORAL | 0 refills | 30.00000 days | Status: CP
Start: 2024-10-11 — End: ?

## 2024-10-11 MED ORDER — LISINOPRIL 20 MG TABLET
ORAL_TABLET | Freq: Every day | ORAL | 0 refills | 20.00000 days | Status: CP
Start: 2024-10-11 — End: 2025-10-11
  Filled 2024-10-13: qty 30, 20d supply, fill #0

## 2024-10-12 ENCOUNTER — Encounter
Admit: 2024-10-12 | Discharge: 2024-10-13 | Payer: PRIVATE HEALTH INSURANCE | Attending: Psychiatric/Mental Health | Primary: Psychiatric/Mental Health

## 2024-10-12 ENCOUNTER — Other Ambulatory Visit: Payer: Self-pay | Admitting: Neurology

## 2024-10-12 ENCOUNTER — Encounter: Payer: Self-pay | Admitting: Neurology

## 2024-10-12 DIAGNOSIS — M47816 Spondylosis without myelopathy or radiculopathy, lumbar region: Secondary | ICD-10-CM

## 2024-10-12 DIAGNOSIS — C187 Malignant neoplasm of sigmoid colon: Principal | ICD-10-CM

## 2024-10-12 DIAGNOSIS — G4700 Insomnia, unspecified: Principal | ICD-10-CM

## 2024-10-12 DIAGNOSIS — F411 Generalized anxiety disorder: Principal | ICD-10-CM

## 2024-10-13 ENCOUNTER — Other Ambulatory Visit: Admit: 2024-10-13 | Discharge: 2024-10-13 | Payer: PRIVATE HEALTH INSURANCE

## 2024-10-13 ENCOUNTER — Ambulatory Visit
Admit: 2024-10-13 | Discharge: 2024-10-13 | Payer: PRIVATE HEALTH INSURANCE | Attending: "Endocrinology | Primary: "Endocrinology

## 2024-10-13 DIAGNOSIS — C187 Malignant neoplasm of sigmoid colon: Principal | ICD-10-CM

## 2024-10-13 DIAGNOSIS — Z794 Long term (current) use of insulin: Principal | ICD-10-CM

## 2024-10-13 DIAGNOSIS — E1165 Type 2 diabetes mellitus with hyperglycemia: Principal | ICD-10-CM

## 2024-10-13 DIAGNOSIS — E119 Type 2 diabetes mellitus without complications: Principal | ICD-10-CM

## 2024-10-13 MED ORDER — INSULIN DEGLUDEC (U-100) 100 UNIT/ML (3 ML) SUBCUTANEOUS PEN
4 refills | 0.00000 days | Status: CP
Start: 2024-10-13 — End: ?
  Filled 2024-10-13: qty 30, 83d supply, fill #2

## 2024-10-13 MED ORDER — DEXCOM G7 SENSOR DEVICE
3 refills | 0.00000 days | Status: CP
Start: 2024-10-13 — End: ?
  Filled 2024-10-13: qty 9, 90d supply, fill #1

## 2024-10-13 MED ORDER — MOUNJARO 2.5 MG/0.5 ML SUBCUTANEOUS PEN INJECTOR
SUBCUTANEOUS | 3 refills | 0.00000 days | Status: CP
Start: 2024-10-13 — End: ?

## 2024-10-13 MED ORDER — LANCETS
12 refills | 0.00000 days | Status: CP
Start: 2024-10-13 — End: ?

## 2024-10-13 MED ORDER — BLOOD GLUCOSE TEST STRIPS
ORAL_STRIP | ORAL | 3 refills | 0.00000 days | Status: CP
Start: 2024-10-13 — End: ?

## 2024-10-13 MED ORDER — GLUCAGON 3 MG/ACTUATION NASAL SPRAY
NASAL | 3 refills | 0.00000 days | Status: CP
Start: 2024-10-13 — End: ?

## 2024-10-13 MED ORDER — MOUNJARO 5 MG/0.5 ML SUBCUTANEOUS PEN INJECTOR
SUBCUTANEOUS | 5 refills | 0.00000 days | Status: CP
Start: 2024-10-13 — End: ?

## 2024-10-13 MED ORDER — INSULIN ASPART (U-100) 100 UNIT/ML (3 ML) SUBCUTANEOUS PEN
3 refills | 0.00000 days | Status: CP
Start: 2024-10-13 — End: ?
  Filled 2024-10-13: qty 15, 50d supply, fill #5

## 2024-10-13 MED ORDER — BLOOD-GLUCOSE METER KIT WRAPPER
ORAL | 0 refills | 0.00000 days | Status: CP
Start: 2024-10-13 — End: ?

## 2024-10-16 ENCOUNTER — Telehealth: Payer: Self-pay | Admitting: Neurology

## 2024-10-16 ENCOUNTER — Encounter: Payer: Self-pay | Admitting: Neurology

## 2024-10-16 NOTE — Telephone Encounter (Signed)
 Pt called to request to come in for injection . Pt stated she has ongoing Margarine that would not go away and she has been taking medication but its not helping . Pt would like to Speak to MD about getting in for an injection .

## 2024-10-16 NOTE — Telephone Encounter (Signed)
 Pt Called to Follow up about  getting injection  Informed PT

## 2024-10-16 NOTE — Telephone Encounter (Signed)
 I called the patient. She said she has a bad migraine unresponsive to 3 Rizatriptan . I discussed that I was calling to offer a trigger point with Dr Vear tomorrow or next Monday but also to see if this was her typical migraine.  She said her head is hurting in the back of head which has happened before but is not typical. Patient said she is not going to wait overnight for a trigger point and will instead go to [facility] ER. I discussed with her that if she is having an atypical headache especially if in conjunction with new symptoms we would recommend ER for evaluation. Patient said she has been a patient of Dr Duncan for 20 yrs and has never had an issue getting in with him the same day. I did very kindly inform patient that she is always welcome to call our office first to see however we are not setup to be an urgent care and unfortunately he cannot get her in today. The patient again said that she has never had an issue with this before. She thanked me for the call.

## 2024-10-16 NOTE — Telephone Encounter (Signed)
 Already addressed

## 2024-10-25 DIAGNOSIS — C187 Malignant neoplasm of sigmoid colon: Principal | ICD-10-CM

## 2024-11-03 ENCOUNTER — Encounter: Admit: 2024-11-03 | Discharge: 2024-11-03 | Payer: MEDICARE

## 2024-11-03 DIAGNOSIS — I10 Essential (primary) hypertension: Principal | ICD-10-CM

## 2024-11-03 DIAGNOSIS — Z1231 Encounter for screening mammogram for malignant neoplasm of breast: Principal | ICD-10-CM

## 2024-11-03 DIAGNOSIS — E119 Type 2 diabetes mellitus without complications: Principal | ICD-10-CM

## 2024-11-03 DIAGNOSIS — G43909 Migraine, unspecified, not intractable, without status migrainosus: Principal | ICD-10-CM

## 2024-11-03 DIAGNOSIS — Z1159 Encounter for screening for other viral diseases: Principal | ICD-10-CM

## 2024-11-03 MED ORDER — LISINOPRIL 20 MG-HYDROCHLOROTHIAZIDE 12.5 MG TABLET
ORAL_TABLET | Freq: Every day | ORAL | 3 refills | 90.00000 days | Status: CP
Start: 2024-11-03 — End: 2024-11-03

## 2024-11-03 MED ORDER — PANTOPRAZOLE 40 MG TABLET,DELAYED RELEASE
ORAL_TABLET | Freq: Every day | ORAL | 3 refills | 90.00000 days | Status: CP
Start: 2024-11-03 — End: 2025-11-03

## 2024-11-03 MED ORDER — ROSUVASTATIN 10 MG TABLET
ORAL_TABLET | Freq: Every evening | ORAL | 3 refills | 90.00000 days | Status: CP
Start: 2024-11-03 — End: 2025-11-03

## 2024-11-03 MED ORDER — ASPIRIN 81 MG TABLET,DELAYED RELEASE
ORAL_TABLET | Freq: Every day | ORAL | 3 refills | 90.00000 days | Status: CP
Start: 2024-11-03 — End: 2024-11-03

## 2024-11-21 DIAGNOSIS — S46011D Strain of muscle(s) and tendon(s) of the rotator cuff of right shoulder, subsequent encounter: Principal | ICD-10-CM

## 2024-12-04 ENCOUNTER — Encounter
Admit: 2024-12-04 | Discharge: 2024-12-05 | Payer: PRIVATE HEALTH INSURANCE | Attending: Psychiatric/Mental Health | Primary: Psychiatric/Mental Health

## 2024-12-04 DIAGNOSIS — F411 Generalized anxiety disorder: Principal | ICD-10-CM

## 2024-12-04 DIAGNOSIS — G4700 Insomnia, unspecified: Principal | ICD-10-CM

## 2024-12-06 DIAGNOSIS — E119 Type 2 diabetes mellitus without complications: Principal | ICD-10-CM

## 2024-12-06 MED ORDER — INSULIN DEGLUDEC (U-100) 100 UNIT/ML (3 ML) SUBCUTANEOUS PEN
4 refills | 0.00000 days
Start: 2024-12-06 — End: ?

## 2024-12-06 MED ORDER — INSULIN ASPART (U-100) 100 UNIT/ML (3 ML) SUBCUTANEOUS PEN
3 refills | 0.00000 days
Start: 2024-12-06 — End: ?

## 2024-12-07 NOTE — Progress Notes (Signed)
 SABRA

## 2024-12-08 MED ORDER — INSULIN ASPART (U-100) 100 UNIT/ML (3 ML) SUBCUTANEOUS PEN
3 refills | 0.00000 days | Status: CP
Start: 2024-12-08 — End: ?
  Filled 2024-12-20: qty 75, 80d supply, fill #0

## 2024-12-08 MED ORDER — INSULIN DEGLUDEC (U-100) 100 UNIT/ML (3 ML) SUBCUTANEOUS PEN
4 refills | 0.00000 days | Status: CP
Start: 2024-12-08 — End: ?

## 2024-12-08 MED FILL — ONDANSETRON 8 MG DISINTEGRATING TABLET: ORAL | 21 days supply | Qty: 18 | Fill #1

## 2024-12-11 ENCOUNTER — Ambulatory Visit: Admitting: Neurology

## 2024-12-11 ENCOUNTER — Other Ambulatory Visit: Payer: Self-pay

## 2024-12-11 ENCOUNTER — Other Ambulatory Visit (HOSPITAL_COMMUNITY): Payer: Self-pay

## 2024-12-11 ENCOUNTER — Encounter: Payer: Self-pay | Admitting: Neurology

## 2024-12-11 ENCOUNTER — Telehealth: Payer: Self-pay | Admitting: Neurology

## 2024-12-11 VITALS — BP 150/92 | HR 88 | Ht 66.0 in | Wt 261.0 lb

## 2024-12-11 DIAGNOSIS — M47816 Spondylosis without myelopathy or radiculopathy, lumbar region: Secondary | ICD-10-CM | POA: Diagnosis not present

## 2024-12-11 DIAGNOSIS — G43709 Chronic migraine without aura, not intractable, without status migrainosus: Secondary | ICD-10-CM | POA: Diagnosis not present

## 2024-12-11 DIAGNOSIS — H532 Diplopia: Secondary | ICD-10-CM | POA: Diagnosis not present

## 2024-12-11 DIAGNOSIS — G4733 Obstructive sleep apnea (adult) (pediatric): Secondary | ICD-10-CM | POA: Diagnosis not present

## 2024-12-11 DIAGNOSIS — Z794 Long term (current) use of insulin: Principal | ICD-10-CM

## 2024-12-11 DIAGNOSIS — E1165 Type 2 diabetes mellitus with hyperglycemia: Principal | ICD-10-CM

## 2024-12-11 MED ORDER — MOUNJARO 2.5 MG/0.5 ML SUBCUTANEOUS PEN INJECTOR
3 refills | 0.00000 days
Start: 2024-12-11 — End: ?

## 2024-12-11 MED ORDER — PANTOPRAZOLE 40 MG TABLET,DELAYED RELEASE
ORAL_TABLET | Freq: Every day | ORAL | 3 refills | 90.00000 days
Start: 2024-12-11 — End: 2025-12-11

## 2024-12-11 MED ORDER — ROSUVASTATIN 10 MG TABLET
ORAL_TABLET | Freq: Every evening | ORAL | 3 refills | 90.00000 days
Start: 2024-12-11 — End: 2025-12-11

## 2024-12-11 MED ORDER — MOUNJARO 5 MG/0.5 ML SUBCUTANEOUS PEN INJECTOR
5 refills | 0.00000 days
Start: 2024-12-11 — End: ?

## 2024-12-11 MED ORDER — LISINOPRIL 20 MG TABLET
ORAL_TABLET | Freq: Every day | ORAL | 0 refills | 20.00000 days
Start: 2024-12-11 — End: 2025-12-11

## 2024-12-11 MED ORDER — LISINOPRIL 20 MG-HYDROCHLOROTHIAZIDE 12.5 MG TABLET
ORAL_TABLET | Freq: Every day | ORAL | 3 refills | 90.00000 days
Start: 2024-12-11 — End: ?

## 2024-12-11 MED ORDER — ASPIRIN 81 MG TABLET,DELAYED RELEASE
ORAL_TABLET | Freq: Every day | ORAL | 3 refills | 90.00000 days
Start: 2024-12-11 — End: 2025-12-11

## 2024-12-11 MED ORDER — KETOROLAC TROMETHAMINE 60 MG/2ML IM SOLN
INTRAMUSCULAR | 5 refills | Status: AC
  Filled 2024-12-11: qty 8, 30d supply, fill #0

## 2024-12-11 MED ORDER — OXYCODONE-ACETAMINOPHEN 10-325 MG PO TABS: 1.0000 | ORAL_TABLET | Freq: Three times a day (TID) | ORAL | 0 refills | Status: AC | PRN

## 2024-12-11 NOTE — Telephone Encounter (Signed)
 Referral for neurosurgery fax to [facility]. Phone: [phone], Fax: [phone]

## 2024-12-11 NOTE — Progress Notes (Signed)
 "  [facility]  PATIENT: Pamela Murphy DOB: 1970/12/21  REASON FOR VISIT: Chronic Migraine headaches   HISTORICAL  CHIEF COMPLAINT:  Chief Complaint  Patient presents with   Follow-up    Room 10 Alone Osa migrane- last migrane yesterday     HISTORY OF PRESENT ILLNESS:  Pamela Murphy is a 54 y.o. woman with a chronic migraine disorder, obstructive sleep apnea and abnormal MRI of the brain.   Update 12/11/2024 She has OSA and is on CPAP and tolerates it well.    Download last year showed 100% compliance with excellent efficacy (AHI of 1.6).  She sleeps better when she uses CPAP than when she does not.  She usually feels  refreshed in the morning.   She has HTN and med's were recently adjusted  She went to the ED due to HA and was found to have hypokalemia - needed an IV and was d/c.  Chronic migraine headaches are doing very well on Ajovy .   However, still gets occasional severe HA.  She had a couple bad ones since last visit.    She had a shot of Toradol  with benefit.   Unfortunately, she has had trouble getting the vials.    She is noting more painful dysesthesias in her right hand.   Numbness is in all fingers.   This sometimes wakes her up.     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   She also is tender at the right inner elbow.    Strength is fine in the hand.  Diplopia is persistent since 2023.  At that time, she was on chemotherapy for her colon cancer.  Diplopia iis binocular and often worse on lateral gaze but can be present at primary gaze (esp with driving so often closes one eye).   She has prism  glasses that help for driving but not with reading/computer work It fluctuates quite a bit and is usually worse as the day goes on..  She has not had ptosis.  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     She has seen Neuro-ophthalmology (Dr. Rema at [facility]) who had felt that myasthenia is a possibility.      She noted variable esotropia from visit to visit (was 10-12 first visit with her and 4-6 second visit).    She started pyridostigmine  for possible myasthenia gravis.   She was on 60 mg po tid but stopped - does the same off and on.    We had tested acetylcholine receptor antibodies and they were negative.  Her neuro-ophthalmologist tested her for LEMS.   We could consider Then, sometime in May 2025 we could consider her doing single-fiber EMG (she would prefer to do at [facility] if we do this)  Low back pain has improved with radiofrequency ablations of the medial branches nerves.  MRI of the lumbar spine has shown left greater than right facet hypertrophy at L4-L5 and L5-S1.  She has colon cancer and had a partial resection resection.   She did oxiplatin and capectotabine.   No liver met's.   She has 2 small nodules in lung nit felt to be cancer.  . She has had repeat cancer markers and they are fine.   While receiving chemotherapy she did have some numbness in her feet that has since improved.  However, she has persistent left lateral plantar region.  There is no weakness or pain noted.  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    She did just start menopause.   She takes tizanidine .  Initially it helped her sleep but now even 2 pills has not helped much.      Trazodone had not helped 10-15 years ago.   She takes gabapentin  300 mgat night as well.    She has RLS, worse on the left.     Migraine history:  She has a long history of chronic migraines that were occurring almost every day (25-28 days/month) for 4 or more hours a day. Multiple medications were tried for prophylactic and abortive therapies with suboptimal response.    In the past, she has failed multiple medications including:  Antiepileptics (Topamax, Keppra, zonisamide), anti-depressants (Cymbalta, Prozac), multiple NSAID's, betas blockers (Inderal, atenolol ), muscle relaxants (soma, tizanidine , cyclobenzaprine) in various  combinations.   Once the headache occurs, she has tried triptan's with incomplete benefit much of the time. She has also tried opiates.   IM Toradol  has been most effective when one occurs.  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   I undiagnosed her around 2010 and she got a second opinion from Dr. Lonza who also concurred.  MRI shows small round foci predominantly in the subcortical and deep white matter in a nonspecific manner.  Her last MRI of the brain was in 2016 and was stable compared to the previous ones.  She had had paresthesias and fatigue in the past that contributed to her diagnosis.   We have discussed that she has been stable times many years off therapy that MS is significantly less likely.   REVIEW OF SYSTEMS:  Constitutional: No fevers, chills, sweats, or change in appetite Eyes: No visual changes.  She has diplopia.   Ear, nose and throat: No hearing loss, ear pain, nasal congestion, sore throat Cardiovascular: No chest pain, palpitations Respiratory:  see above.  She has OSA. GastrointestinaI: No nausea, vomiting, diarrhea, abdominal pain, fecal incontinence Genitourinary:  No dysuria, urinary retention or frequency.  No nocturia.   Dysfunctional uterine bleeding Musculoskeletal:  notes low back pain that has been worse the last couple months and neck pain that is helped by Botox  Integumentary: No rash, pruritus, skin lesions Neurological: as above Psychiatric: No depression at this time.  No anxiety Endocrine: No palpitations, diaphoresis, change in appetite, change in weigh or increased thirst Hematologic/Lymphatic:   She has anemia due to dysfunctional uterine bleeding. Allergic/Immunologic: No itchy/runny eyes, nasal congestion, recent allergic reactions, rashes  ALLERGIES: Allergies  Allergen Reactions   Metoclopramide Anxiety    Other Reaction(s): Not available   Cefaclor Swelling and Rash     neck  swelling   Prochlorperazine Maleate Rash and Other (See Comments)     jittery;tachycardia   Bupropion Anxiety   Sulfamethoxazole-Trimethoprim Nausea Only   Tessalon Perles [Benzonatate] Rash    HOME MEDICATIONS: Outpatient Medications Prior to Visit  Medication Sig Dispense Refill   atenolol  (TENORMIN ) 25 MG tablet TAKE 1 TABLET (25 MG TOTAL) BY MOUTH DAILY. 90 tablet 3   botulinum toxin Type A  (BOTOX ) 200 units injection INJECT INTO THE MUSCLES BY PHYSICIAN EVERY 3 MONTHS FOR MIGRAINE HEADACHES. DISCARD UNUSED PORTION. 1 each 3   clonazePAM (KLONOPIN) 0.5 MG tablet Take 0.5 mg by mouth 2 (two) times daily as needed.     DULoxetine (CYMBALTA) 60 MG capsule Take 90 mg by mouth daily. 90 mg tablet daily  Fremanezumab -vfrm (AJOVY ) 225 MG/1.5ML SOAJ INJECT 1.5 MLS INTO THE SKIN EVERY 30 (THIRTY) DAYS. 1.5 mL 4   gabapentin  (NEURONTIN ) 300 MG capsule TAKE 1 TO 2 CAPSULES BY MOUTH AT BEDTIME 60 capsule 11   Multiple Vitamins-Minerals (MULTIVITAMIN WITH MINERALS) tablet Take by mouth.     ondansetron  (ZOFRAN -ODT) 8 MG disintegrating tablet TAKE 1 TABLET BY MOUTH EVERY 8 HOURS AS NEEDED FOR NAUSEA AND VOMITING 18 tablet 4   pantoprazole (PROTONIX) 40 MG tablet Take 40 mg by mouth 2 (two) times daily.      PNEUMOCOCCAL VAC POLYVALENT IJ Inject as directed. Received in January 2021     rizatriptan  (MAXALT -MLT) 10 MG disintegrating tablet TAKE 1 TABLET BY MOUTH AS NEEDED FOR MIGRAINE. MAY REPEAT IN 2 HOURS IF NEEDED 12 tablet 3   tiZANidine  (ZANAFLEX ) 4 MG tablet TAKE 1 TABLET BY MOUTH THREE TIMES A DAY 270 tablet 2   indomethacin  (INDOCIN ) 25 MG capsule TAKE 1 CAPSULE BY MOUTH 3 TIMES DAILY WITH MEALS. 90 capsule 3   oxyCODONE -acetaminophen  (PERCOCET) 10-325 MG tablet Take 1 tablet by mouth every 8 (eight) hours as needed for pain. 90 tablet 0   Facility-Administered Medications Prior to Visit  Medication Dose Route Frequency Provider Last Rate Last Admin   botulinum toxin Type A  (BOTOX )  injection 155 Units  155 Units Intramuscular Once Nainoa Woldt, Charlie LABOR, MD        PAST MEDICAL HISTORY: Past Medical History:  Diagnosis Date   Headache    Hypertension    Type 2 diabetes mellitus (HCC) 06/08/2018   Vision abnormalities     PAST SURGICAL HISTORY: Past Surgical History:  Procedure Laterality Date   SHOULDER SURGERY Left 03/30/2024   SINUS IRRIGATION      FAMILY HISTORY: Family History  Problem Relation Age of Onset   Breast cancer Mother    Melanoma Mother    Atrial fibrillation Mother    Supraventricular tachycardia Mother    Heart attack Father    Hypertension Father    Hyperlipidemia Father     SOCIAL HISTORY:  Social History   Socioeconomic History   Marital status: Married    Spouse name: Not on file   Number of children: Not on file   Years of education: Not on file   Highest education level: Not on file  Occupational History    Comment: full time  Tobacco Use   Smoking status: Never   Smokeless tobacco: Never  Substance and Sexual Activity   Alcohol use: No    Alcohol/week: 0.0 standard drinks of alcohol    Comment: Rare   Drug use: No   Sexual activity: Not on file  Other Topics Concern   Not on file  Social History Narrative   Lives with spouse   Right Handed   Drinks 1-2 cups caffeine daily   Social Drivers of Health   Tobacco Use: Low Risk (12/04/2024)   Received from [facility]   Patient History    Smoking Tobacco Use: Never    Smokeless Tobacco Use: Never    Passive Exposure: Not on file  Financial Resource Strain: Low Risk (12/22/2023)   Received [facility]   Overall Financial Resource Strain (CARDIA)    How hard is it for you to pay for the very basics like food, housing, medical care, and heating?: Not hard at all  Food Insecurity: No Food Insecurity (12/22/2023)   [facility]   Epic    Within the  past 12 months, you worried that your food would run out before you got the money to buy  more.: Never true    Within the past 12 months, the food you bought just didn't last and you didn't have money to get more.: Never true  Transportation Needs: No Transportation Needs (12/22/2023)   Received from [facility] - Transportation    Lack of Transportation (Medical): No    Lack of Transportation (Non-Medical): No  Physical Activity: Not on file  Stress: Not on file  Social Connections: Not on file  Intimate Partner Violence: Not At Risk (09/12/2024)   Received from [facility]   Epic    Within the last year, have you been afraid of your partner or ex-partner?: No    Within the last year, have you been humiliated or emotionally abused in other ways by your partner or ex-partner?: No    Within the last year, have you been kicked, hit, slapped, or otherwise physically hurt by your partner or ex-partner?: No    Within the last year, have you been raped or forced to have any kind of sexual activity by your partner or ex-partner?: No  Depression (PHQ2-9): Not on file  Alcohol Screen: Not on file  Housing: Not on file  Utilities: Low Risk (12/22/2023)   Received from [facility]   Utilities    Within the past 12 months, have you been unable to get utilities(heat, electricity) when it was really needed?: No  Health Literacy: Not on file     PHYSICAL EXAM  Vitals:   12/11/24 1010 12/11/24 1017  BP: (!) 160/112 (!) 150/92  Pulse: 88   SpO2: 97%   Weight: 261 lb (118.4 kg)   Height: 5' 6 (1.676 m)         Body mass index is 42.13 kg/m.   General: The patient is well-developed and well-nourished and in no acute distress  Musculoskeletal:   Her neck has a good ROM. She has no neck pain.   Mildly reduced left shoulder range of motion (active).  Neurologic Exam  Mental status: The patient is alert and oriented x 3 at the time of the examination.  Speech is normal.  Cranial nerves: Today, she has diplopia that increases to lateral gaze, R>L.  Gaze  is disconjugate when she looks to the right more than when she looks to the left.  There is milder right esotropia on primary gaze.    No current ptosis.   Vision is bette with prism  glasses.   Facial strength and sensation was normal.  Trapezius strength was normal.  No obvious hearing deficits are noted.  Motor:   Muscle tone and bulk is normal. She has normal strength.    Sensory: She has Tinel's sign at the wrists L>R and right elbow.  There was reduced sensation in median innervated left hand.  Otherwise  sensation to touch was normal in the arms and legs.  Gait and station: Station and gait are normal.     Deep tendon reflexes are normal and symmetric 2 in the arms and 1 in the legs.       ASSESSMENT AND PLAN    Facet syndrome, lumbar - Plan: Ambulatory referral to Spine Surgery  Facet hypertrophy of lumbar region  Chronic migraine w/o aura w/o status migrainosus, not intractable  Obstructive sleep apnea  Diplopia   1.   Migraines are generally controlled but she has occasional severe ones still.  Continue Ajovy  for migraine - doing better than Botox .    When a migraine occurs, she will take Maxalt .  For more severe headaches she can take Percocet.  I can retry Toradol -she had some trouble with the pharmacy getting this filled. 3.   Vision is better with prisms.  She sees neuro-ophthalmology at [facility]. 4.   Continue to use CPAP nightly for OSA.   Efficacy is excellent with AHI = 1.2 5.   She has facet syndrome that has been greatly helped by medial branch procedures.  She will get radiofrequency ablations for her L4-L5 and L5-S1 facet hypertrophy when the lower back pain returns.  This has been very helpful for her.  Again.  She has had benefit for about 9 to 12 months when these are done.   The physician that was doing these procedures has moved away and we will try to get her worked into the spine center at [facility] which is closer to where she lives  6.    Try a wrist splint on the  left -   if not better may need to do NCV/EMG Return to see me in about 6 months for the next Botox  injection, or sooner if she has new or worsening neurologic symptoms.   Neshia Mckenzie A. Vear, MD, PhD 12/11/2024, 12:50 PM Certified in Neurology, Clinical Neurophysiology, Sleep Medicine, Pain Medicine and Neuroimaging  [facility] [address], [address] [place], [place] [address] [phone]  "

## 2024-12-12 MED ORDER — MOUNJARO 2.5 MG/0.5 ML SUBCUTANEOUS PEN INJECTOR
3 refills | 0.00000 days | Status: CP
Start: 2024-12-12 — End: ?

## 2024-12-12 MED ORDER — PANTOPRAZOLE 40 MG TABLET,DELAYED RELEASE
ORAL_TABLET | Freq: Every day | ORAL | 3 refills | 90.00000 days
Start: 2024-12-12 — End: 2025-12-12

## 2024-12-12 MED ORDER — MOUNJARO 5 MG/0.5 ML SUBCUTANEOUS PEN INJECTOR
5 refills | 0.00000 days | Status: CP
Start: 2024-12-12 — End: ?

## 2024-12-12 MED ORDER — ASPIRIN 81 MG TABLET,DELAYED RELEASE
ORAL_TABLET | Freq: Every day | ORAL | 3 refills | 90.00000 days
Start: 2024-12-12 — End: 2025-12-12

## 2024-12-12 MED ORDER — LISINOPRIL 20 MG-HYDROCHLOROTHIAZIDE 12.5 MG TABLET
ORAL_TABLET | Freq: Every day | ORAL | 3 refills | 90.00000 days
Start: 2024-12-12 — End: ?

## 2024-12-12 MED ORDER — ROSUVASTATIN 10 MG TABLET
ORAL_TABLET | Freq: Every evening | ORAL | 3 refills | 90.00000 days
Start: 2024-12-12 — End: 2025-12-12

## 2024-12-12 MED ORDER — LISINOPRIL 20 MG TABLET
ORAL_TABLET | Freq: Every day | ORAL | 3 refills | 66.00000 days
Start: 2024-12-12 — End: 2025-12-12

## 2024-12-12 MED FILL — DULOXETINE 30 MG CAPSULE,DELAYED RELEASE: ORAL | 90 days supply | Qty: 270 | Fill #1

## 2024-12-20 MED FILL — ATENOLOL 25 MG TABLET: ORAL | 90 days supply | Qty: 90 | Fill #1

## 2024-12-25 ENCOUNTER — Other Ambulatory Visit (HOSPITAL_COMMUNITY): Payer: Self-pay

## 2024-12-29 ENCOUNTER — Encounter: Payer: Self-pay | Admitting: Neurology

## 2025-08-13 ENCOUNTER — Ambulatory Visit: Admitting: Neurology
